# Patient Record
Sex: Male | Born: 1970 | Race: White | Hispanic: No | Smoking: Current every day smoker
Health system: Southern US, Community
[De-identification: ages and names within clinical notes are randomized; demographics above are authoritative.]

## PROBLEM LIST (undated history)

## (undated) DIAGNOSIS — G629 Polyneuropathy, unspecified: Secondary | ICD-10-CM

## (undated) DIAGNOSIS — E119 Type 2 diabetes mellitus without complications: Secondary | ICD-10-CM

## (undated) DIAGNOSIS — I10 Essential (primary) hypertension: Secondary | ICD-10-CM

## (undated) DIAGNOSIS — F1921 Other psychoactive substance dependence, in remission: Secondary | ICD-10-CM

## (undated) DIAGNOSIS — M549 Dorsalgia, unspecified: Secondary | ICD-10-CM

## (undated) DIAGNOSIS — M25569 Pain in unspecified knee: Secondary | ICD-10-CM

## (undated) DIAGNOSIS — E7800 Pure hypercholesterolemia, unspecified: Secondary | ICD-10-CM

## (undated) HISTORY — DX: Polyneuropathy, unspecified: G62.9

## (undated) HISTORY — DX: Dorsalgia, unspecified: M54.9

## (undated) HISTORY — DX: Type 2 diabetes mellitus without complications: E11.9

## (undated) HISTORY — DX: Essential (primary) hypertension: I10

## (undated) HISTORY — PX: HERNIA REPAIR: SHX51

## (undated) HISTORY — DX: Pain in unspecified knee: M25.569

## (undated) HISTORY — PX: KNEE SURGERY: SHX244

---

## 2004-08-15 ENCOUNTER — Emergency Department: Payer: Self-pay | Admitting: Emergency Medicine

## 2004-12-14 ENCOUNTER — Emergency Department: Payer: Self-pay | Admitting: General Practice

## 2005-01-19 ENCOUNTER — Emergency Department: Payer: Self-pay | Admitting: Emergency Medicine

## 2008-08-29 ENCOUNTER — Emergency Department: Payer: Self-pay | Admitting: Emergency Medicine

## 2009-05-25 ENCOUNTER — Ambulatory Visit: Payer: Self-pay | Admitting: Family Medicine

## 2009-09-28 ENCOUNTER — Ambulatory Visit: Payer: Self-pay | Admitting: Internal Medicine

## 2009-12-25 ENCOUNTER — Ambulatory Visit: Payer: Self-pay | Admitting: Family Medicine

## 2010-01-29 ENCOUNTER — Ambulatory Visit: Payer: Self-pay | Admitting: Family Medicine

## 2010-04-15 ENCOUNTER — Ambulatory Visit: Payer: Self-pay | Admitting: Family Medicine

## 2010-10-06 ENCOUNTER — Ambulatory Visit: Payer: Self-pay | Admitting: Family Medicine

## 2011-02-11 ENCOUNTER — Emergency Department: Payer: Self-pay | Admitting: Emergency Medicine

## 2011-12-25 DIAGNOSIS — M549 Dorsalgia, unspecified: Secondary | ICD-10-CM | POA: Insufficient documentation

## 2012-10-16 ENCOUNTER — Ambulatory Visit: Payer: Self-pay | Admitting: Family Medicine

## 2013-05-06 DIAGNOSIS — G4733 Obstructive sleep apnea (adult) (pediatric): Secondary | ICD-10-CM | POA: Insufficient documentation

## 2014-02-24 DIAGNOSIS — Z79891 Long term (current) use of opiate analgesic: Secondary | ICD-10-CM | POA: Insufficient documentation

## 2014-02-24 DIAGNOSIS — F1120 Opioid dependence, uncomplicated: Secondary | ICD-10-CM | POA: Insufficient documentation

## 2014-10-22 ENCOUNTER — Ambulatory Visit: Payer: Self-pay | Admitting: Family Medicine

## 2015-11-17 ENCOUNTER — Ambulatory Visit: Payer: Self-pay | Admitting: Urology

## 2015-11-17 VITALS — BP 146/78 | HR 104 | Wt 286.3 lb

## 2015-11-17 DIAGNOSIS — M792 Neuralgia and neuritis, unspecified: Secondary | ICD-10-CM

## 2015-11-17 DIAGNOSIS — E119 Type 2 diabetes mellitus without complications: Secondary | ICD-10-CM

## 2015-11-17 LAB — GLUCOSE, POCT (MANUAL RESULT ENTRY): POC Glucose: 117 mg/dl — AB (ref 70–99)

## 2015-11-17 MED ORDER — GABAPENTIN 300 MG PO CAPS: ORAL_CAPSULE | ORAL | Status: DC

## 2015-11-17 MED ORDER — METFORMIN HCL 500 MG PO TABS: 500.0000 mg | ORAL_TABLET | Freq: Two times a day (BID) | ORAL | Status: DC

## 2015-11-17 MED ORDER — LISINOPRIL-HYDROCHLOROTHIAZIDE 10-12.5 MG PO TABS: 1.0000 | ORAL_TABLET | Freq: Every day | ORAL | Status: DC

## 2015-11-17 NOTE — Progress Notes (Signed)
Patient: Luis [name] Male    DOB: 1971-02-15   45 y.o.   MRN: [id number] Visit Date: 11/17/2015  Today's Provider: [facility]   Chief Complaint  Patient presents with  . Establish Care   Subjective:    Diabetes He presents for his initial diabetic visit. He has type 2 diabetes mellitus. No MedicAlert identification noted. The initial diagnosis of diabetes was made 1 month ago. His disease course has been worsening. There are no hypoglycemic associated symptoms. Associated symptoms include blurred vision and visual change. Pertinent negatives for diabetes include no chest pain, no polydipsia, no polyphagia and no polyuria. There are no hypoglycemic complications. Symptoms are improving. Diabetic complications include peripheral neuropathy and retinopathy. Risk factors for coronary artery disease include diabetes mellitus, male sex, hypertension, obesity and tobacco exposure. Current diabetic treatment includes oral agent (monotherapy) and diet. He is compliant with treatment most of the time. His weight is decreasing steadily. He is following a generally healthy diet. Meal planning includes avoidance of concentrated sweets, carbohydrate counting and calorie counting. He has not had a previous visit with a dietitian. He participates in exercise daily. His breakfast blood glucose is taken between 7-8 am. His breakfast blood glucose range is generally 130-140 mg/dl. His dinner blood glucose is taken between 5-6 pm. His dinner blood glucose range is generally 110-130 mg/dl. An ACE inhibitor/angiotensin II receptor blocker is being taken. He does not see a podiatrist.Eye exam is not current.       No Known Allergies Previous Medications   GABAPENTIN (NEURONTIN) 300 MG CAPSULE    Take 300 mg by mouth 3 (three) times daily.   LISINOPRIL-HYDROCHLOROTHIAZIDE (ZESTORETIC) 10-12.5 MG TABLET    Take by mouth daily.   METFORMIN (GLUCOPHAGE) 500 MG TABLET    Take 500 mg by mouth 2  (two) times daily.   METHADONE (DOLOPHINE) 10 MG/5ML SOLUTION    Take by mouth.    Review of Systems  Constitutional: Positive for activity change. Negative for fever and chills.  HENT: Positive for dental problem. Negative for trouble swallowing and voice change.   Eyes: Positive for blurred vision. Negative for photophobia, pain, discharge, redness and itching.  Respiratory: Negative for cough and shortness of breath.   Cardiovascular: Positive for leg swelling. Negative for chest pain.  Gastrointestinal: Negative for nausea and diarrhea.  Endocrine: Negative for polydipsia, polyphagia and polyuria.  Genitourinary: Negative.   Musculoskeletal: Negative.   Skin: Negative.   Allergic/Immunologic: Negative.   Neurological: Negative.   Hematological: Negative.   Psychiatric/Behavioral: Negative.     Social History  Substance Use Topics  . Smoking status: Current Every Day Smoker -- 0.50 packs/day    Types: Cigarettes  . Smokeless tobacco: Never Used  . Alcohol Use: No   Objective:   BP 146/78 mmHg  Pulse 104  Wt 286 lb 4.8 oz (129.865 kg)  Physical Exam  Constitutional: He appears well-developed and well-nourished.  HENT:  Head: Normocephalic and atraumatic.  Eyes: Conjunctivae and EOM are normal. Pupils are equal, round, and reactive to light.  Cardiovascular: Normal rate, regular rhythm and normal heart sounds.   Pulmonary/Chest: Effort normal and breath sounds normal.  Abdominal: Soft. Bowel sounds are normal.  Skin: Skin is warm and dry.  Psychiatric: He has a normal mood and affect. His behavior is normal. Judgment and thought content normal.        Assessment & Plan:    DM type 2  -  Glucose is  117 ng/dL at today's visit  - refills on metformin, gabapentin, lisinopril/hctz given  - eye exam  Peripheral neuropathy  - increase the gabapentin from 300 mg tid to 2, 300 mg tablets tid     Follow up in one month  [facility]  [facility]

## 2015-12-15 ENCOUNTER — Ambulatory Visit: Payer: Self-pay

## 2016-04-28 ENCOUNTER — Ambulatory Visit: Payer: Self-pay

## 2016-04-28 ENCOUNTER — Telehealth: Payer: Self-pay | Admitting: Nurse Practitioner

## 2016-04-28 ENCOUNTER — Telehealth: Payer: Self-pay

## 2016-04-28 NOTE — Telephone Encounter (Signed)
Patient called to cancel appointment for 04/28/2016 and wanted to schedule appointment for 05/03/2016.

## 2016-04-28 NOTE — Telephone Encounter (Signed)
Called pt to make appt. appt made.

## 2016-10-27 ENCOUNTER — Other Ambulatory Visit: Payer: Self-pay | Admitting: Nurse Practitioner

## 2016-10-27 DIAGNOSIS — E119 Type 2 diabetes mellitus without complications: Secondary | ICD-10-CM

## 2016-12-01 ENCOUNTER — Ambulatory Visit: Payer: Self-pay | Admitting: Adult Health Nurse Practitioner

## 2016-12-01 VITALS — BP 121/72 | HR 97 | Temp 98.1°F | Wt 371.0 lb

## 2016-12-01 DIAGNOSIS — I10 Essential (primary) hypertension: Secondary | ICD-10-CM

## 2016-12-01 DIAGNOSIS — E1140 Type 2 diabetes mellitus with diabetic neuropathy, unspecified: Secondary | ICD-10-CM

## 2016-12-01 DIAGNOSIS — E118 Type 2 diabetes mellitus with unspecified complications: Secondary | ICD-10-CM | POA: Insufficient documentation

## 2016-12-01 DIAGNOSIS — E119 Type 2 diabetes mellitus without complications: Secondary | ICD-10-CM

## 2016-12-01 DIAGNOSIS — Z794 Long term (current) use of insulin: Secondary | ICD-10-CM | POA: Insufficient documentation

## 2016-12-01 LAB — POCT GLUCOSE (DEVICE FOR HOME USE): POC Glucose: 383 mg/dl — AB (ref 70–99)

## 2016-12-01 MED ORDER — GABAPENTIN 300 MG PO CAPS: ORAL_CAPSULE | ORAL | 3 refills | Status: DC

## 2016-12-01 MED ORDER — LISINOPRIL-HYDROCHLOROTHIAZIDE 10-12.5 MG PO TABS: 1.0000 | ORAL_TABLET | Freq: Every day | ORAL | 1 refills | Status: DC

## 2016-12-01 NOTE — Progress Notes (Signed)
  Patient: Luis [name] Male    DOB: 04-10-1971   46 y.o.   MRN: [id number] Visit Date: 12/01/2016  Today's Provider: Jacelyn Pi, NP   Chief Complaint  Patient presents with  . Medication Refill  . Back Pain  . Peripheral Neuropathy   Subjective:    HPI   DM:  Taking medications as directed.  CBGs average 150-160.  Does not have A1C in the computer.   Pt states that he has been without his gabapentin and his neuropathy is worsened in his hands and feet. He was taking 600mg  TID, states that the neuropathy was better.    HTN:  Taking medications as directed. States he is on a diet now, and he is walking daily.    No Known Allergies Previous Medications   GABAPENTIN (NEURONTIN) 300 MG CAPSULE    Take two tablets three times daily   LISINOPRIL-HYDROCHLOROTHIAZIDE (ZESTORETIC) 10-12.5 MG TABLET    Take 1 tablet by mouth daily.   METFORMIN (GLUCOPHAGE) 500 MG TABLET    Take 1 tablet (500 mg total) by mouth 2 (two) times daily.   METHADONE (DOLOPHINE) 10 MG/5ML SOLUTION    Take by mouth.    Review of Systems  All other systems reviewed and are negative.   Social History  Substance Use Topics  . Smoking status: Current Every Day Smoker    Packs/day: 0.50    Types: Cigarettes  . Smokeless tobacco: Never Used  . Alcohol use No   Objective:   BP 121/72   Pulse 97   Temp 98.1 F (36.7 C)   Wt (!) 371 lb (168.3 kg)   Physical Exam  Constitutional: He is oriented to person, place, and time. He appears well-developed and well-nourished.  HENT:  Head: Normocephalic and atraumatic.  Eyes: Pupils are equal, round, and reactive to light.  Neck: Normal range of motion. Neck supple.  Cardiovascular: Normal rate, regular rhythm and normal heart sounds.   Pulmonary/Chest: Effort normal and breath sounds normal.  Abdominal: Soft. Bowel sounds are normal.  Musculoskeletal: Normal range of motion.  Neurological: He is alert and oriented to person, place, and time.   Skin: Skin is warm and dry.  Psychiatric: He has a normal mood and affect.  Vitals reviewed.       Assessment & Plan:     DM:  A1C check today.  Encourage diabetic diet and exercise.  Continue current medication regimen.  Given glucometer- take CBG fasting 3 x week.   Neuropathy:  Restart Gabapentin 600mg  TID.  Re-evaluate for dosage increase at next OV.    HTN:  Controlled.  Goal BP <140/80.  Continue current medication regimen.  Encourage low salt diet and exercise.    Routine labs ordered.         Jacelyn Pi, NP   [facility]

## 2016-12-02 LAB — COMPREHENSIVE METABOLIC PANEL
A/G RATIO: 1.4 (ref 1.2–2.2)
ALT: 27 IU/L (ref 0–44)
AST: 21 IU/L (ref 0–40)
Albumin: 4 g/dL (ref 3.5–5.5)
Alkaline Phosphatase: 91 IU/L (ref 39–117)
BUN/Creatinine Ratio: 22 — ABNORMAL HIGH (ref 9–20)
BUN: 20 mg/dL (ref 6–24)
CALCIUM: 9.4 mg/dL (ref 8.7–10.2)
CHLORIDE: 91 mmol/L — AB (ref 96–106)
CO2: 23 mmol/L (ref 18–29)
Creatinine, Ser: 0.91 mg/dL (ref 0.76–1.27)
GFR calc Af Amer: 118 mL/min/{1.73_m2} (ref >59)
GFR, EST NON AFRICAN AMERICAN: 102 mL/min/{1.73_m2} (ref >59)
GLUCOSE: 373 mg/dL — AB (ref 65–99)
Globulin, Total: 2.9 g/dL (ref 1.5–4.5)
POTASSIUM: 4.9 mmol/L (ref 3.5–5.2)
Sodium: 132 mmol/L — ABNORMAL LOW (ref 134–144)
Total Protein: 6.9 g/dL (ref 6.0–8.5)

## 2016-12-02 LAB — CBC
Hematocrit: 45.8 % (ref 37.5–51.0)
Hemoglobin: 15.2 g/dL (ref 13.0–17.7)
MCH: 27.7 pg (ref 26.6–33.0)
MCHC: 33.2 g/dL (ref 31.5–35.7)
MCV: 83 fL (ref 79–97)
PLATELETS: 293 10*3/uL (ref 150–379)
RBC: 5.49 x10E6/uL (ref 4.14–5.80)
RDW: 14.4 % (ref 12.3–15.4)
WBC: 14.9 10*3/uL — AB (ref 3.4–10.8)

## 2016-12-02 LAB — TSH: TSH: 3.78 u[IU]/mL (ref 0.450–4.500)

## 2016-12-02 LAB — SPECIMEN STATUS REPORT

## 2016-12-06 ENCOUNTER — Ambulatory Visit: Payer: Self-pay | Admitting: Endocrinology

## 2016-12-06 VITALS — BP 138/77 | HR 98 | Temp 98.0°F | Wt 372.9 lb

## 2016-12-06 DIAGNOSIS — E119 Type 2 diabetes mellitus without complications: Secondary | ICD-10-CM

## 2016-12-06 LAB — GLUCOSE, POCT (MANUAL RESULT ENTRY): POC Glucose: 318 mg/dl — AB (ref 70–99)

## 2016-12-06 MED ORDER — EMPAGLIFLOZIN 10 MG PO TABS: 10.0000 mg | ORAL_TABLET | Freq: Every day | ORAL | 4 refills | Status: DC

## 2016-12-06 MED ORDER — DULAGLUTIDE 0.75 MG/0.5ML ~~LOC~~ SOAJ: 0.7500 mg | Freq: Every day | SUBCUTANEOUS | 11 refills | Status: DC

## 2016-12-06 NOTE — Progress Notes (Signed)
Assessment:   1. Diabetes mellitus without complication (HCC)  Needs additional medication based on April HbA1c test (12.2%) and SMBG over the past week (range: 270-470).  His weight has increased from 280 lbs last April to 375 lbs this April. Luis [name] is open to injection. Neuropathy is worsening.        Plan:    1.  Rx changes: Add Jardiance 10 mg and order trulicity now. Continue metformin. Return in one month and increase Jardiance to 25 mg when he returns. Consider adding trulicity at that time (order)  2.  Luis [name] attended the shared medical appointment on 12/06/16. Topics that were discussed tonight include diet, exercise, taking medications, blood glucose pattern recognition, meaning of laboratory results.   3. Follow up: I recommend patient follow-up with me at 1 months.    Subjective:    Luis [name] is a 46 y.o. male who is seen in follow up forType 2 diabetes. He was here one week ago but came back because his blood sugar has been above 300 since he received a meter at that time.   Eye exam current (within one year): No  Luis [name] is performing SMBG on average 3-4 times per day over the past week since he got the meter. He reports 250-350 in the morning, and high throughout the day. Range over last few days is 278-470. He takes 500 mg metformin in the morning and evening and has no side effects.   Reports no symptoms of hypoglycemia.Denies chest pain, shortness of breath, edema, foot lesions or ulcers. Denies severe hypoglycemia or admission to hospital for DKA. He reports neuropathy in both hands.   Current exercise: walks 30 min every day.   The patient's history was reviewed and updated as appropriate.  No Known Allergies   Current Outpatient Prescriptions:  .  gabapentin (NEURONTIN) 300 MG capsule, Take two tablets three times daily, Disp: 180 capsule, Rfl: 3 .  ibuprofen (ADVIL,MOTRIN) 200 MG tablet, Take 200 mg by mouth every 6 (six)  hours as needed., Disp: , Rfl:  .  lisinopril-hydrochlorothiazide (ZESTORETIC) 10-12.5 MG tablet, Take 1 tablet by mouth daily., Disp: 90 tablet, Rfl: 1 .  methadone (DOLOPHINE) 10 MG/5ML solution, Take by mouth., Disp: , Rfl:  .  metFORMIN (GLUCOPHAGE) 500 MG tablet, Take 1 tablet (500 mg total) by mouth 2 (two) times daily., Disp: 60 tablet, Rfl: 12  Social History   Social History  . Marital status: Single    Spouse name: N/A  . Number of children: N/A  . Years of education: N/A   Social History Main Topics  . Smoking status: Current Every Day Smoker    Packs/day: 0.50    Types: Cigarettes  . Smokeless tobacco: Never Used  . Alcohol use No  . Drug use: No  . Sexual activity: Not on file   Other Topics Concern  . Not on file   Social History Narrative  . No narrative on file    Family History  Problem Relation Age of Onset  . Cancer Mother   . Heart disease Father   . Diabetes Paternal Grandfather     Review of Systems A 12 point review of systems was negative except for pertinent items noted in the HPI.   Objective:     Wt Readings from Last 3 Encounters:  12/06/16 (!) 372 lb 14.4 oz (169.1 kg)  12/01/16 (!) 371 lb (168.3 kg)  11/17/15 286 lb 4.8 oz (129.9 kg)   BP  138/77   Pulse 98   Temp 98 F (36.7 C)   Wt (!) 372 lb 14.4 oz (169.1 kg)   General appearance:  alert and appears stated age, in no distress      Eyes:  conjunctivae/corneas clear. EOM's intact. Sclera anicteric. Negative lid lag or proptosis     Neck: no adenopathy, supple, symmetrical, trachea midline  Thyroid:  Mobile, normal size, no palpable nodule  Lung: clear to auscultation bilaterally  Heart:  regular rate and rhythm, S1, S2 normal, no murmur, click, rub or gallop  Abdomen:  bowel sounds normal; negative bruits  Extremities: extremities normal, atraumatic, no cyanosis or edema     Pulses: DP & PT 2+ and symmetric.  Neuro: normal without focal findings, mental status, speech  normal, alert and oriented x3, reflexes normal and symmetric and gait normal.   Feet: negative lesions or ulcers, 10 gram monofilament intact bilateral plantar surface   Skin with tags and acanthosis  Lab Review No components found for: A1C Glucose (mg/dL)  Date Value  16/05/9603 373 (H)   CO2 (mmol/L)  Date Value  12/01/2016 23   BUN (mg/dL)  Date Value  54/04/8118 20   Creatinine, Ser (mg/dL)  Date Value  14/78/2956 0.91   No components found for: LDL,  LDLCALC,  LDLDIRECT Lab Results  Component Value Date   NA 132 (L) 12/01/2016   K 4.9 12/01/2016   CL 91 (L) 12/01/2016   CO2 23 12/01/2016   BUN 20 12/01/2016   CREATININE 0.91 12/01/2016   GFRAA 118 12/01/2016   GFRNONAA 102 12/01/2016   CALCIUM 9.4 12/01/2016   ALBUMIN 4.0 12/01/2016     DIABETES MELLITUS RESULTS: Lab Results  Component Value Date   HGBA1C 12.1 (H) 12/01/2016   Lab Results  Component Value Date   LDLCALC WILL FOLLOW 12/01/2016   CREATININE 0.91 12/01/2016   Lab Results  Component Value Date   CHOL WILL FOLLOW 12/01/2016   Lab Results  Component Value Date   LDLCALC WILL FOLLOW 12/01/2016   No components found for: CHOLLLDLDIRECT No components found for: MICROALB/CR Lab Results  Component Value Date   GFRAA 118 12/01/2016   GFRNONAA 102 12/01/2016

## 2016-12-06 NOTE — Patient Instructions (Signed)
Start Jardiance (empagliflozin) when it comes in We will eventually increase dose to 25 mg We will add Trulicity or Ozempic after that if needed  Here is information on Jardiance: Empagliflozin oral tablets What is this medicine? EMPAGLIFLOZIN (EM pa gli FLOE zin) helps to treat type 2 diabetes. It helps to control blood sugar. This drug may also reduce the risk of heart attack or stroke if you have type 2 diabetes and risk factors for heart disease. Treatment is combined with diet and exercise. This medicine may be used for other purposes; ask your health care provider or pharmacist if you have questions. COMMON BRAND NAME(S): JARDIANCE What should I tell my health care provider before I take this medicine? They need to know if you have any of these conditions: -dehydration -diabetic ketoacidosis -diet low in salt -eating less due to illness, surgery, dieting, or any other reason -having surgery -high cholesterol -high levels of potassium in the blood -history of pancreatitis or pancreas problems -history of yeast infection of the penis or vagina -if you often drink alcohol -infections in the bladder, kidneys, or urinary tract -kidney disease -liver disease -low blood pressure -on hemodialysis -problems urinating -type 1 diabetes -uncircumcised male -an unusual or allergic reaction to empagliflozin, other medicines, foods, dyes, or preservatives -pregnant or trying to get pregnant -breast-feeding How should I use this medicine? Take this medicine by mouth with a glass of water. Follow the directions on the prescription label. Take it in the morning, with or without food. Take your dose at the same time each day. Do not take more often than directed. Do not stop taking except on your doctor's advice. Talk to your pediatrician regarding the use of this medicine in children. Special care may be needed. Overdosage: If you think you have taken too much of this medicine contact a  poison control center or emergency room at once. NOTE: This medicine is only for you. Do not share this medicine with others. What if I miss a dose? If you miss a dose, take it as soon as you can. If it is almost time for your next dose, take only that dose. Do not take double or extra doses. What may interact with this medicine? Do not take this medicine with any of the following medications: -gatifloxacin This medicine may also interact with the following medications: -alcohol -certain medicines for blood pressure, heart disease -diuretics This list may not describe all possible interactions. Give your health care provider a list of all the medicines, herbs, non-prescription drugs, or dietary supplements you use. Also tell them if you smoke, drink alcohol, or use illegal drugs. Some items may interact with your medicine. What should I watch for while using this medicine? Visit your doctor or health care professional for regular checks on your progress. This medicine can cause a serious condition in which there is too much acid in the blood. If you develop nausea, vomiting, stomach pain, unusual tiredness, or breathing problems, stop taking this medicine and call your doctor right away. If possible, use a ketone dipstick to check for ketones in your urine. A test called the HbA1C (A1C) will be monitored. This is a simple blood test. It measures your blood sugar control over the last 2 to 3 months. You will receive this test every 3 to 6 months. Learn how to check your blood sugar. Learn the symptoms of low and high blood sugar and how to manage them. Always carry a quick-source of sugar with you in  case you have symptoms of low blood sugar. Examples include hard sugar candy or glucose tablets. Make sure others know that you can choke if you eat or drink when you develop serious symptoms of low blood sugar, such as seizures or unconsciousness. They must get medical help at once. Tell your doctor or  health care professional if you have high blood sugar. You might need to change the dose of your medicine. If you are sick or exercising more than usual, you might need to change the dose of your medicine. Do not skip meals. Ask your doctor or health care professional if you should avoid alcohol. Many nonprescription cough and cold products contain sugar or alcohol. These can affect blood sugar. Wear a medical ID bracelet or chain, and carry a card that describes your disease and details of your medicine and dosage times. What side effects may I notice from receiving this medicine? Side effects that you should report to your doctor or health care professional as soon as possible: -allergic reactions like skin rash, itching or hives, swelling of the face, lips, or tongue -breathing problems -dizziness -fast or irregular heartbeat -feeling faint or lightheaded, falls -muscle weakness -nausea, vomiting, unusual stomach upset or pain -signs and symptoms of low blood sugar such as feeling anxious, confusion, dizziness, increased hunger, unusually weak or tired, sweating, shakiness, cold, irritable, headache, blurred vision, fast heartbeat, loss of consciousness -signs and symptoms of a urinary tract infection, such as fever, chills, a burning feeling when urinating, blood in the urine, back pain -trouble passing urine or change in the amount of urine, including an urgent need to urinate more often, in larger amounts, or at night -penile discharge, itching, or pain in men -unusual tiredness -vaginal discharge, itching, or odor in women Side effects that usually do not require medical attention (report to your doctor or health care professional if they continue or are bothersome): -joint pain -mild increase in urination -thirsty This list may not describe all possible side effects. Call your doctor for medical advice about side effects. You may report side effects to FDA at 1-800-FDA-1088. Where  should I keep my medicine? Keep out of the reach of children. Store at room temperature between 20 and 25 degrees C (68 and 77 degrees F). Throw away any unused medicine after the expiration date. NOTE: This sheet is a summary. It may not cover all possible information. If you have questions about this medicine, talk to your doctor, pharmacist, or health care provider.  2018 Elsevier/Gold Standard (2015-09-10 11:46:10)

## 2016-12-07 LAB — SPECIMEN STATUS REPORT

## 2016-12-07 LAB — LIPID PANEL W/O CHOL/HDL RATIO

## 2016-12-07 LAB — HGB A1C W/O EAG: Hgb A1c MFr Bld: 12.1 % — ABNORMAL HIGH (ref 4.8–5.6)

## 2016-12-08 ENCOUNTER — Telehealth: Payer: Self-pay

## 2016-12-08 DIAGNOSIS — E119 Type 2 diabetes mellitus without complications: Secondary | ICD-10-CM

## 2016-12-08 MED ORDER — DULAGLUTIDE 0.75 MG/0.5ML ~~LOC~~ SOAJ: 0.7500 mg | SUBCUTANEOUS | 11 refills | Status: DC

## 2016-12-08 NOTE — Telephone Encounter (Signed)
-----   Message from Skipper Cliche, [facility] sent at 12/08/2016 10:41 AM EDT ----- Regarding: dose clarification for patient Hi Lorrie and Angelica Chessman, Just wanted to see if you could update the medication record for [employer]? The Trulicity was ordered on 12/06/16 by Dr. Almond Lint for 0.75mg , inject once daily. This should be injected once weekly.   The patient received a sample yesterday and we had already corrected the directions. Patient is aware to use the medication once weekly.  Thank you!  Keri

## 2016-12-22 ENCOUNTER — Ambulatory Visit: Payer: Self-pay | Admitting: Pharmacy Technician

## 2016-12-22 DIAGNOSIS — Z79899 Other long term (current) drug therapy: Secondary | ICD-10-CM

## 2016-12-22 NOTE — Patient Instructions (Signed)
Patient to schedule MTM with receptionist.

## 2016-12-22 NOTE — Progress Notes (Signed)
Completed [facility] application and contract.  Patient agreed to all terms of the [facility] contract.   Patient approved to receive medication assistance through 2018 at [facility], as long as eligibility criteria continues to be met.  Provided patient with Civil engineer, contracting based on his particular needs.    Jardiance and [employer] completed with patient.  Forwarded to [facility] and patient for signature.  Upon receipt of signed applications from provider and patient, Jardiance Prescription Application will be submitted to [employer] and Trulicity will be submitted to [employer].  [facility]

## 2017-01-03 ENCOUNTER — Ambulatory Visit: Payer: Self-pay | Admitting: Internal Medicine

## 2017-01-03 DIAGNOSIS — E119 Type 2 diabetes mellitus without complications: Secondary | ICD-10-CM

## 2017-01-03 MED ORDER — METFORMIN HCL 500 MG PO TABS: 1000.0000 mg | ORAL_TABLET | Freq: Two times a day (BID) | ORAL | 3 refills | Status: DC

## 2017-01-03 NOTE — Progress Notes (Signed)
Assessment:   Luis [name] feels better than last time we saw him 1 month ago and added trulicity. He reports his blood sugar readings have come down from 270-440 to 140-220 in the AM and PM. He has also lost 4 pounds. It is too soon to redraw HbA1c.     Plan:    1.  Rx changes: Increase metformin to 1000 mg BID. Add 10 mg Jardiance.    2.  Luis [name] attended the shared medical appointment on @DATE @. Topics that were discussed tonight include diet, exercise, taking medications, blood glucose pattern recognition, meaning of laboratory results.   3. Follow up: I recommend patient follow-up with me at  2 months.    Subjective:    Luis [name] is a 46 y.o. male who is seen in follow up forType 2 diabetes.  Current regimen:Luis [name] started taking the Trulicity 1 month ago and reports much lower blood sugars.   500 mg metformin 2x a day Trulicity (0.75 mg/1x a week)   He tests in the morning and in the evening. He sees 150s in the morning and the evening. Range 140-220. He did not bring his meter.   No problems with hypoglycemia. Denies chest pain, shortness of breath, edema, foot lesions or ulcers. Denies severe hypoglycemia or admission to hospital for DKA.  Current exercise: He walks every day and stays active, complicated by neuropathy.   The patient's history was reviewed and updated as appropriate.  No Known Allergies   Current Outpatient Prescriptions:  .  Dulaglutide (TRULICITY) 0.75 MG/0.5ML SOPN, Inject 0.75 mg into the skin once a week., Disp: 4 pen, Rfl: 11 .  gabapentin (NEURONTIN) 300 MG capsule, Take two tablets three times daily, Disp: 180 capsule, Rfl: 3 .  ibuprofen (ADVIL,MOTRIN) 200 MG tablet, Take 200 mg by mouth every 6 (six) hours as needed., Disp: , Rfl:  .  lisinopril-hydrochlorothiazide (ZESTORETIC) 10-12.5 MG tablet, Take 1 tablet by mouth daily., Disp: 90 tablet, Rfl: 1 .  methadone (DOLOPHINE) 10 MG/5ML solution, Take by mouth., Disp: , Rfl:  .   empagliflozin (JARDIANCE) 10 MG TABS tablet, Take 10 mg by mouth daily., Disp: 90 tablet, Rfl: 4 .  metFORMIN (GLUCOPHAGE) 500 MG tablet, Take 1 tablet (500 mg total) by mouth 2 (two) times daily., Disp: 60 tablet, Rfl: 12  Social History   Social History  . Marital status: Single    Spouse name: N/A  . Number of children: N/A  . Years of education: N/A   Social History Main Topics  . Smoking status: Current Every Day Smoker    Packs/day: 0.50    Types: Cigarettes  . Smokeless tobacco: Never Used  . Alcohol use No  . Drug use: No  . Sexual activity: Not on file   Other Topics Concern  . Not on file   Social History Narrative  . No narrative on file    Family History  Problem Relation Age of Onset  . Cancer Mother   . Heart disease Father   . Diabetes Paternal Grandfather     Review of Systems A 12 point review of systems was negative except for pertinent items noted in the HPI.   Objective:     Wt Readings from Last 3 Encounters:  12/06/16 (!) 372 lb 14.4 oz (169.1 kg)  12/01/16 (!) 371 lb (168.3 kg)  11/17/15 286 lb 4.8 oz (129.9 kg)   There were no vitals taken for this visit.  General appearance:  alert and appears stated  age, in no distress      Eyes:  conjunctivae/corneas clear. EOM's intact. Sclera anicteric. Negative lid lag or proptosis     Neck: no adenopathy, supple, symmetrical, trachea midline  Thyroid:  Mobile, normal size, no palpable nodule  Lung: clear to auscultation bilaterally  Heart:  regular rate and rhythm, S1, S2 normal, no murmur, click, rub or gallop  Abdomen:  bowel sounds normal; negative bruits  Extremities: extremities normal, atraumatic, no cyanosis or edema     Pulses: DP & PT 2+ and symmetric.  Neuro: normal without focal findings, mental status, speech normal, alert and oriented x3, reflexes normal and symmetric and gait normal.   Feet: negative lesions or ulcers, 10 gram monofilament intact bilateral plantar surface      Lab Review No components found for: A1C Glucose (mg/dL)  Date Value  24/40/1027 373 (H)   CO2 (mmol/L)  Date Value  12/01/2016 23   BUN (mg/dL)  Date Value  25/36/6440 20   Creatinine, Ser (mg/dL)  Date Value  34/74/2595 0.91   No components found for: LDL,  LDLCALC,  LDLDIRECT Lab Results  Component Value Date   NA 132 (L) 12/01/2016   K 4.9 12/01/2016   CL 91 (L) 12/01/2016   CO2 23 12/01/2016   BUN 20 12/01/2016   CREATININE 0.91 12/01/2016   GFRAA 118 12/01/2016   GFRNONAA 102 12/01/2016   CALCIUM 9.4 12/01/2016   ALBUMIN 4.0 12/01/2016     DIABETES MELLITUS RESULTS: Lab Results  Component Value Date   HGBA1C 12.1 (H) 12/01/2016   Lab Results  Component Value Date   CREATININE 0.91 12/01/2016   Lab Results  Component Value Date   CHOL CANCELED 12/01/2016   No results found for: LDLCALC No components found for: CHOLLLDLDIRECT No components found for: MICROALB/CR Lab Results  Component Value Date   GFRAA 118 12/01/2016   GFRNONAA 102 12/01/2016

## 2017-01-03 NOTE — Patient Instructions (Addendum)
If you are able to pick up the Jardiance, begin taking the 10 mg dose. It will make you pee more during the night. If this is not tolerable, call and we can increase the dose of Trulicity instead. Stay up on fluids while you take the Jardiance.  Increase your metformin to 2 pills twice a day. If you cannot tolerate go back to your normal dose.

## 2017-01-05 ENCOUNTER — Telehealth: Payer: Self-pay

## 2017-01-05 ENCOUNTER — Encounter: Payer: Self-pay | Admitting: Pharmacist

## 2017-01-05 NOTE — Telephone Encounter (Signed)
Received PAP application from [facility] for Jardiance placed for provider to sign. 

## 2017-01-09 ENCOUNTER — Ambulatory Visit
Admission: RE | Admit: 2017-01-09 | Discharge: 2017-01-09 | Disposition: A | Discharge status: Home / Self Care | Payer: Disability Insurance | Source: Ambulatory Visit | Attending: Obstetrics and Gynecology | Admitting: Obstetrics and Gynecology

## 2017-01-09 ENCOUNTER — Other Ambulatory Visit: Payer: Self-pay | Admitting: Obstetrics and Gynecology

## 2017-01-09 DIAGNOSIS — G8929 Other chronic pain: Secondary | ICD-10-CM

## 2017-01-09 DIAGNOSIS — E119 Type 2 diabetes mellitus without complications: Secondary | ICD-10-CM | POA: Insufficient documentation

## 2017-01-09 DIAGNOSIS — M1711 Unilateral primary osteoarthritis, right knee: Secondary | ICD-10-CM | POA: Insufficient documentation

## 2017-01-09 DIAGNOSIS — G629 Polyneuropathy, unspecified: Secondary | ICD-10-CM

## 2017-01-09 DIAGNOSIS — I10 Essential (primary) hypertension: Secondary | ICD-10-CM | POA: Diagnosis present

## 2017-01-09 DIAGNOSIS — M25561 Pain in right knee: Secondary | ICD-10-CM

## 2017-01-09 DIAGNOSIS — M549 Dorsalgia, unspecified: Secondary | ICD-10-CM

## 2017-01-09 DIAGNOSIS — R262 Difficulty in walking, not elsewhere classified: Secondary | ICD-10-CM

## 2017-01-19 ENCOUNTER — Ambulatory Visit: Payer: Disability Insurance | Admitting: Pharmacist

## 2017-01-19 ENCOUNTER — Telehealth: Payer: Self-pay

## 2017-01-19 ENCOUNTER — Encounter: Payer: Self-pay | Admitting: Pharmacist

## 2017-01-19 VITALS — BP 119/69 | Ht 70.0 in | Wt 367.0 lb

## 2017-01-19 DIAGNOSIS — Z79899 Other long term (current) drug therapy: Secondary | ICD-10-CM
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
## 2017-01-19 NOTE — Telephone Encounter (Signed)
Placed signed application/script in [facility] folder for pickup.Received PAP application from [facility] for Trulicity placed for provider to sign.

## 2017-01-19 NOTE — Telephone Encounter (Signed)
Placed signed application/script in [facility] folder for pickup. 

## 2017-02-07 ENCOUNTER — Telehealth: Payer: Self-pay | Admitting: Pharmacist

## 2017-02-07 NOTE — Telephone Encounter (Signed)
02/07/17 Faxed [employer] application & script for Jardiance 10 mg Take 1 tablet by mouth every day.AJ 02/07/17 Faxed [employer] application for Trulicity 0.75mg  Inject 0.75mg  pen into the skin once a week.Luis [name]

## 2017-03-02 ENCOUNTER — Ambulatory Visit: Payer: Self-pay | Admitting: Nurse Practitioner

## 2017-03-02 VITALS — BP 141/78 | HR 105 | Temp 99.5°F | Ht 70.0 in | Wt 371.0 lb

## 2017-03-02 DIAGNOSIS — E119 Type 2 diabetes mellitus without complications: Secondary | ICD-10-CM

## 2017-03-02 LAB — GLUCOSE, POCT (MANUAL RESULT ENTRY): POC Glucose: 172 mg/dl — AB (ref 70–99)

## 2017-03-02 MED ORDER — GABAPENTIN 300 MG PO CAPS: ORAL_CAPSULE | ORAL | 3 refills | Status: DC

## 2017-03-02 MED ORDER — LISINOPRIL-HYDROCHLOROTHIAZIDE 10-12.5 MG PO TABS: 1.0000 | ORAL_TABLET | Freq: Every day | ORAL | 1 refills | Status: DC

## 2017-03-02 NOTE — Progress Notes (Unsigned)
PT HERE TODAY FOR F/U  STATES HE HAD LOST 30 POUNDS, THAN REGAINED THE 30 AND MORE  HERE TODAY WITH CONCERN OF NAUSEA FOR FIRST TWO DAYS AFTER TAKING THE TRULICITY, ON 0.75 DOSE   EXAM:    ALERT VERBALLY APPROPRIATE AND IN NO ACUTE DISTRESS  NO CAROTID BRUITS, AP RRR, BBrS CLEAR, BLE WITH 2+ EDEDA      PLAN:  WILL CONTINUE TRULICITY AT CURRENT DOSE WILL PRESCRIBE A NAUSEA MED TO BE USED THE FIRST 2 DAYS, RATIONALE DISCUSSED WITH PT.    PAIN CONTINUES WILL INCREASE GABAPENTIN TO 6OO IN AM AND AND 900 MG MID DAY AND EVENING  BP IS ELEVATED TODAY, WILL REASSESS AFTER LABS  WILL RETURN AFTER LABS

## 2017-03-16 ENCOUNTER — Other Ambulatory Visit: Payer: Self-pay

## 2017-03-21 ENCOUNTER — Ambulatory Visit: Payer: Self-pay

## 2017-03-23 ENCOUNTER — Other Ambulatory Visit: Payer: Self-pay

## 2017-03-23 DIAGNOSIS — E119 Type 2 diabetes mellitus without complications: Secondary | ICD-10-CM

## 2017-03-23 DIAGNOSIS — I10 Essential (primary) hypertension: Secondary | ICD-10-CM

## 2017-03-24 LAB — SPECIMEN STATUS REPORT

## 2017-03-25 LAB — COMPREHENSIVE METABOLIC PANEL
ALK PHOS: 86 IU/L (ref 39–117)
ALT: 18 IU/L (ref 0–44)
AST: 15 IU/L (ref 0–40)
Albumin/Globulin Ratio: 1.3 (ref 1.2–2.2)
Albumin: 4.3 g/dL (ref 3.5–5.5)
BUN/Creatinine Ratio: 18 (ref 9–20)
BUN: 17 mg/dL (ref 6–24)
Bilirubin Total: <0.2 mg/dL (ref 0.0–1.2)
CALCIUM: 9 mg/dL (ref 8.7–10.2)
CO2: 19 mmol/L — AB (ref 20–29)
CREATININE: 0.93 mg/dL (ref 0.76–1.27)
Chloride: 95 mmol/L — ABNORMAL LOW (ref 96–106)
GFR calc Af Amer: 114 mL/min/{1.73_m2} (ref >59)
GFR, EST NON AFRICAN AMERICAN: 99 mL/min/{1.73_m2} (ref >59)
GLOBULIN, TOTAL: 3.3 g/dL (ref 1.5–4.5)
GLUCOSE: 169 mg/dL — AB (ref 65–99)
Potassium: 5.6 mmol/L — ABNORMAL HIGH (ref 3.5–5.2)
Sodium: 140 mmol/L (ref 134–144)
Total Protein: 7.6 g/dL (ref 6.0–8.5)

## 2017-03-25 LAB — HEMOGLOBIN A1C
Est. average glucose Bld gHb Est-mCnc: 232 mg/dL
HEMOGLOBIN A1C: 9.7 % — AB (ref 4.8–5.6)

## 2017-03-25 LAB — TSH: TSH: 6.75 u[IU]/mL — AB (ref 0.450–4.500)

## 2017-04-06 ENCOUNTER — Telehealth: Payer: Self-pay | Admitting: Urology

## 2017-04-06 ENCOUNTER — Ambulatory Visit: Payer: Self-pay | Admitting: Urology

## 2017-04-06 VITALS — BP 154/85 | HR 97 | Temp 98.2°F | Wt 370.2 lb

## 2017-04-06 DIAGNOSIS — E119 Type 2 diabetes mellitus without complications: Secondary | ICD-10-CM

## 2017-04-06 LAB — GLUCOSE, POCT (MANUAL RESULT ENTRY): POC GLUCOSE: 231 mg/dL — AB (ref 70–99)

## 2017-04-06 NOTE — Telephone Encounter (Signed)
Would you check on patient's Trulicity prescription?

## 2017-04-06 NOTE — Progress Notes (Signed)
  Patient: Luis [name] Male    DOB: 1971/08/07   46 y.o.   MRN: [id number] Visit Date: 04/06/2017  Today's Provider: [facility]   Chief Complaint  Patient presents with  . Follow-up    Needs more test strips; says pharmacy is out of Trulicity    Subjective:    HPI Patient has a crack in his left heel and is afraid that it will get infected.  He has been out of his Trulicity x one month.    BS at home have been around 170.  He has been taking his Jardiance 10 mg daily and metformin 500 mg bid  Crack in his heel has been present for months.  He has been using a pumice stone.    No Known Allergies Previous Medications   DULAGLUTIDE (TRULICITY) 0.75 MG/0.5ML SOPN    Inject 0.75 mg into the skin once a week.   EMPAGLIFLOZIN (JARDIANCE) 10 MG TABS TABLET    Take 10 mg by mouth daily.   GABAPENTIN (NEURONTIN) 300 MG CAPSULE    TAKE 2 TABLETS IN THE MORNING, 3 TABLETS MID DAY, AND 3 TABLETS IN THE EVENING   IBUPROFEN (ADVIL,MOTRIN) 200 MG TABLET    Take 200 mg by mouth every 6 (six) hours as needed.   LISINOPRIL-HYDROCHLOROTHIAZIDE (ZESTORETIC) 10-12.5 MG TABLET    Take 1 tablet by mouth daily.   METFORMIN (GLUCOPHAGE) 500 MG TABLET    Take 2 tablets (1,000 mg total) by mouth 2 (two) times daily.   METHADONE (DOLOPHINE) 10 MG/5ML SOLUTION    Take by mouth.    Review of Systems  Social History  Substance Use Topics  . Smoking status: Current Every Day Smoker    Packs/day: 1.00    Types: Cigarettes  . Smokeless tobacco: Never Used  . Alcohol use No   Objective:   BP (!) 154/85   Pulse 97   Temp 98.2 F (36.8 C)   Wt (!) 370 lb 3.2 oz (167.9 kg)   BMI 53.12 kg/m   Physical Exam Constitutional: Well nourished. Alert and oriented, No acute distress. HEENT: [place] AT, moist mucus membranes. Trachea midline, no masses. Cardiovascular: No clubbing, cyanosis, or edema. Respiratory: Normal respiratory effort, no increased work of breathing. Skin: No rashes, bruises or  suspicious lesions. Lymph: No cervical or inguinal adenopathy. Neurologic: Grossly intact, no focal deficits, moving all 4 extremities. Psychiatric: Normal mood and affect.      Assessment & Plan:    1. Heel fungus   - pumice stone  - epsom salts  - vaseline  - refer to podiatry       [facility]   [facility]

## 2017-04-06 NOTE — Patient Instructions (Signed)
Use pumice stone, epsom salts and then vaseline

## 2017-04-08 LAB — MICROALBUMIN / CREATININE URINE RATIO
Creatinine, Urine: 314.7 mg/dL
MICROALB/CREAT RATIO: 25.8 mg/g{creat} (ref 0.0–30.0)
Microalbumin, Urine: 81.3 ug/mL

## 2017-04-08 LAB — SPECIMEN STATUS REPORT

## 2017-04-11 ENCOUNTER — Ambulatory Visit: Payer: Self-pay | Admitting: Endocrinology

## 2017-04-11 VITALS — BP 142/82 | HR 103 | Temp 97.9°F | Ht 70.0 in | Wt 365.3 lb

## 2017-04-11 DIAGNOSIS — E1140 Type 2 diabetes mellitus with diabetic neuropathy, unspecified: Secondary | ICD-10-CM

## 2017-04-11 DIAGNOSIS — E119 Type 2 diabetes mellitus without complications: Secondary | ICD-10-CM

## 2017-04-11 LAB — GLUCOSE, POCT (MANUAL RESULT ENTRY): POC GLUCOSE: 310 mg/dL — AB (ref 70–99)

## 2017-04-11 LAB — POCT GLYCOSYLATED HEMOGLOBIN (HGB A1C): HEMOGLOBIN A1C: 9.8

## 2017-04-11 NOTE — Addendum Note (Signed)
Addended by: Hollace Hayward E on: 04/11/2017 09:07 PM   Modules accepted: Orders

## 2017-04-11 NOTE — Progress Notes (Signed)
I saw this patient with the medical student and agree with this plan.

## 2017-04-11 NOTE — Progress Notes (Signed)
Patient's primary care provider: Zachery Dauer, FNP   Luis [name] returns for follow-up of T2 diabetes.    Other problems include:  Patient Active Problem List   Diagnosis Date Noted  . Diabetes mellitus without complication (HCC) 12/01/2016  . Hypertension 12/01/2016  . Type 2 diabetes mellitus with diabetic neuropathy, without long-term current use of insulin (HCC) 12/01/2016    His current medications include: Current Outpatient Prescriptions  Medication Sig Dispense Refill  . empagliflozin (JARDIANCE) 10 MG TABS tablet Take 10 mg by mouth daily. 90 tablet 4  . gabapentin (NEURONTIN) 300 MG capsule TAKE 2 TABLETS IN THE MORNING, 3 TABLETS MID DAY, AND 3 TABLETS IN THE EVENING 180 capsule 3  . ibuprofen (ADVIL,MOTRIN) 200 MG tablet Take 200 mg by mouth every 6 (six) hours as needed.    Marland Kitchen lisinopril-hydrochlorothiazide (ZESTORETIC) 10-12.5 MG tablet Take 1 tablet by mouth daily. 90 tablet 1  . metFORMIN (GLUCOPHAGE) 500 MG tablet Take 2 tablets (1,000 mg total) by mouth 2 (two) times daily. 360 tablet 3  . methadone (DOLOPHINE) 10 MG/5ML solution Take by mouth.    . Dulaglutide (TRULICITY) 0.75 MG/0.5ML SOPN Inject 0.75 mg into the skin once a week. (Patient not taking: Reported on 04/06/2017) 4 pen 11   No current facility-administered medications for this visit.     Interim history: Pt reports he hasn't been taking Trulicity b/c he didn't get his refills. Been off Trulicity for ~2 months.  Pt reports checking BG in AM and PM. He says when he was on Trulicity it ran 140 and now it runs as high as 180 in AM. He reports 270 BS in afternoon (tries to check in between meals).  Pt reports he likes to eat stir-fry veggies and tries to stay away from lots of grains. He reports he drinks water, diet soda, and apple juice. Living at home with mom, who does most of the cooking. 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  He reports more frequent urination w/ addition of Jardiance 1 mo ago. Happy to continue using Jardiance, isn't bothered by increased urination. He reports little exercise due to back pain and poor conditioning. He reports neuropathy in hand and feet and back pain. Pt says leg edema and redness have been present for 2 yrs.   Pt reports no SOB or chest pain.  Pt is a current smoker - 1 pack. Pt is hesitant to quit but acknowledges he needs to.  Pt reports sinus infection for 2 weeks.    Exam:  BP (!) 142/82 (BP Location: Left Arm)   Pulse (!) 103   Temp 97.9 F (36.6 C)   Ht 5\' 10"  (1.778 m)   Wt (!) 365 lb 4.8 oz (165.7 kg)   BMI 52.42 kg/m  Constitutional: Awake, Alert, oriented, in NAD Cardiovascular: Regular rhythm, no murmurs, gallops, rubs Respiratory: Lungs clear, no wheezes or rales Skin: no rashes or lesions. Feet without lesions except extremely large dry cracked callus on left heel Neurologic: 2+ Edema present. No light touch sensation in feet in 5/5 spots bilaterally by monofilament Psychiatric: Oriented, normal mood and affect.   Recent labs:  Results for orders placed or performed in visit on 04/11/17  POCT Glucose (CBG)  Result Value Ref Range   POC Glucose 310 (A) 70 - 99 mg/dl    Assessment and Plan:    1. Diabetes mellitus without complication (  HCC)  - Pt counseled to continue taking medication as prescribed and work on increasing exercise as permitted by chronic pain and poor conditioning. Counseled to quit smoking whenever ready, offered help if interested. Currently interested but not ready to quit. Pt will reinitiate Trulicity once prescription returns.   Patient will follow up in 2 months - check in about podiatry and BS after addition of Trulicity. Will increase trulicity or jardiance if BS remains high.

## 2017-04-12 ENCOUNTER — Telehealth: Payer: Self-pay | Admitting: Pharmacist

## 2017-04-12 NOTE — Telephone Encounter (Signed)
04/12/17 Called [employer] to check on status of Trulicity application that was faxed 02/07/17, spoke with Ashem. Verified received the application, stated it was missing no income letter, I explained that was faxed with application, he placed me on hold and reviewed. He does see the letter, he will send for review and processing, allow 10-14 days.Forde Radon

## 2017-04-12 NOTE — Telephone Encounter (Signed)
04/12/2017 Trulicity- will need to call the company [employer] to check on status of application.Forde Radon

## 2017-04-12 NOTE — Telephone Encounter (Signed)
04/12/17 Called [employer] to check on status of Jardiance that was faxed 03/01/17, spoke with Jan, she stated this medication was shipped to patient on 03/07/17 and delivered on 03/11/17. Next refill due 05-06-17, she was able to process that refill today it with auto ship on 05/06/17 to patient's home, order# [id number], 2 refill left, enrolled till 7/112019.Luis [name]

## 2017-04-27 ENCOUNTER — Encounter: Payer: Self-pay | Admitting: Pharmacist

## 2017-04-27 ENCOUNTER — Ambulatory Visit: Payer: Disability Insurance | Admitting: Pharmacist

## 2017-04-27 VITALS — BP 140/82 | Ht 70.0 in | Wt 364.0 lb

## 2017-04-27 DIAGNOSIS — Z79899 Other long term (current) drug therapy: Secondary | ICD-10-CM

## 2017-04-27 NOTE — Progress Notes (Signed)
Medication Management Clinic Visit Note  Patient: Jaquin Lee Blacketer MRN: [id number] Date of Birth: [birth year] PCP: Zachery Dauer, FNP   [name] 46 y.o. male presents for a 3 mo. F/u MTM visit today.  BP 140/82   Ht  (1.778 m)   Wt (!) 364 lb (165.1 kg)   BMI 52.23 kg/m   Patient Information   Past Medical History:  Diagnosis Date  . Back pain   . Diabetes mellitus without complication (HCC)   . Hypertension   . Neuropathy       Past Surgical History:  Procedure Laterality Date  . KNEE SURGERY Right      Family History  Problem Relation Age of Onset  . Cancer Mother   . Heart disease Father   . Diabetes Paternal Grandfather     New Diagnoses (since last visit): none  Family Support: good  Lifestyle Diet:  uses splenda  Breakfast: 3 eggs, bacon, toast or  Plain cheerios Lunch: ham sandwich, wheat bread Dinner: usually meat and veggies, steak and salad Drinks: diet drinks, lots of water          History  Alcohol Use No      History  Smoking Status  . Current Every Day Smoker  . Packs/day: 1.25  . Types: Cigarettes  Smokeless Tobacco  . Never Used      Health Maintenance  Topic Date Due  . PNEUMOCOCCAL POLYSACCHARIDE VACCINE (1) 02/25/1974  . FOOT EXAM  02/25/1982  . OPHTHALMOLOGY EXAM  02/25/1982  . HIV Screening  02/26/1987  . TETANUS/TDAP  02/26/1991  . INFLUENZA VACCINE  03/22/2017  . HEMOGLOBIN A1C  10/12/2017   Outpatient Encounter Prescriptions as of 04/27/2017  Medication Sig  . empagliflozin (JARDIANCE) 10 MG TABS tablet Take 10 mg by mouth daily.  Marland Kitchen gabapentin (NEURONTIN) 300 MG capsule TAKE 2 TABLETS IN THE MORNING, 3 TABLETS MID DAY, AND 3 TABLETS IN THE EVENING  . ibuprofen (ADVIL,MOTRIN) 200 MG tablet Take 200 mg by mouth every 6 (six) hours as needed.  Marland Kitchen lisinopril-hydrochlorothiazide (ZESTORETIC) 10-12.5 MG tablet Take 1 tablet by mouth daily.  . metFORMIN (GLUCOPHAGE) 500 MG tablet Take 2 tablets (1,000 mg  total) by mouth 2 (two) times daily.  . methadone (DOLOPHINE) 10 MG/5ML solution Take by mouth.  . Dulaglutide (TRULICITY) 0.75 MG/0.5ML SOPN Inject 0.75 mg into the skin once a week. (Patient not taking: Reported on 04/06/2017)   No facility-administered encounter medications on file as of 04/27/2017.     Assessment and Plan:  Compliance/Adherance: pt knows 3W's of all his medication with minimal prompting. States that he rarely misses doses of medications. Pt mentioned that he would like to use a medication organizer will give him one from our stock.  T2DM: checking BG in morning when he gets up and then after evening meal. While on Trulicity BG were running no more than 150s usually lower. He is having trouble maintaining his sugars at that level since he has not been taking the Trulicity. We had 1 month supply in stock that was dispensed to patient and has been waiting for medication to be sent by manufactuer. Expect that the medication will be delivered before next week.  He has been taking Jardiance since it was shipped to his home by the manufacturer. He states that it made him feel tired at first but that he doing well with it now. Will give pt a BG log this visit.  HTN: BP today ofJeyden Coffelt140/82 within  goal of <140/90.  DM neuropathy:  States that the neuropathy is not improving and that he has been taking 1 extra gabapentin. Pt requesting increase - will send note to [facility] to request increase.   Smoking Cessation: would like to quit and willing to try. Discussed several options and pt would like to try to obtain free patches through the quitline. He also would like to speak with MD at next visit to [facility] about trying Chantix if the patches don't work or are not able to obtain.  Pain Management: pt still on 130mg  - but has decreased from 240mg  in the past year. Is interested in discussing possibility of changing to Suboxone with Methadone clinic MD and explained to patient that PAP is available from  the drug manufacturer and the PAP application would need to be filled out and signed/handled by methadone clinic.   Denice Paradise, PharmD, RPh Medication Management Clinic [facility]) [phone]

## 2017-05-01 ENCOUNTER — Telehealth: Payer: Self-pay | Admitting: Pharmacist

## 2017-05-01 NOTE — Telephone Encounter (Signed)
05/01/17 Called [employer] for refill on Jardiance 10mg , they already have in process, this will ship to patient.Forde Radon

## 2017-05-04 ENCOUNTER — Ambulatory Visit: Payer: Self-pay | Admitting: Podiatry

## 2017-05-11 ENCOUNTER — Ambulatory Visit: Payer: Self-pay | Admitting: Family Medicine

## 2017-05-11 VITALS — BP 132/80 | HR 92 | Temp 98.4°F | Wt 368.5 lb

## 2017-05-11 DIAGNOSIS — E1140 Type 2 diabetes mellitus with diabetic neuropathy, unspecified: Secondary | ICD-10-CM

## 2017-05-11 LAB — GLUCOSE, POCT (MANUAL RESULT ENTRY): POC GLUCOSE: 240 mg/dL — AB (ref 70–99)

## 2017-05-11 NOTE — Progress Notes (Signed)
   Subjective:    Patient ID: Luis [name], male    DOB: [birth year], 46 y.o.   MRN: [id number]  HPI  FU appointment   Pt reports having problems obtaining Trulicity from [facility]   Pt reports continuing treatment at Methadone  clinic    Review of Systems     Objective:   Physical Exam  BP 132/80   Pulse 92   Temp 98.4 F (36.9 C)   Wt (!) 368 lb 8 oz (167.2 kg)   BMI 52.87 kg/m  Morbidly obese, pleasant, white male in no acute distress. He was asleep sitting in the chair when walked in the room. Head and neck benign. Chest and lungs clear. Extremities show trace edema with significant venous stasis changes of the lower extremities even at his young age         Assessment & Plan:   Diabetes Recommend weight loss  Recommend treatment for sleep apnea   Recommend discontinuation of pain medication Patient is encouraged to stop smoking. Advised pt on smoking cessation   Follow up in 3 mo  Obesity with sleep apnea Patient encouraged to try CPAP again Tobacco abuse Opioid addiction Patient trying to cut down through methadone clinic. He says he wants to quit.  I have done the exam and reviewed the chart and it is accurate to the best of my knowledge. Dentist has been used and  any errors in dictation or transcription are unintentional. Julieanne Manson M.D. [facility]

## 2017-05-22 ENCOUNTER — Other Ambulatory Visit: Payer: Self-pay | Admitting: Internal Medicine

## 2017-05-22 DIAGNOSIS — E119 Type 2 diabetes mellitus without complications: Secondary | ICD-10-CM

## 2017-06-06 ENCOUNTER — Ambulatory Visit: Payer: Self-pay | Admitting: Licensed Clinical Social Worker

## 2017-06-06 ENCOUNTER — Ambulatory Visit: Payer: Self-pay | Admitting: Physician Assistant

## 2017-06-06 ENCOUNTER — Ambulatory Visit: Payer: Self-pay

## 2017-06-06 VITALS — BP 142/81 | HR 100 | Temp 98.4°F | Wt 366.7 lb

## 2017-06-06 DIAGNOSIS — F411 Generalized anxiety disorder: Secondary | ICD-10-CM

## 2017-06-06 DIAGNOSIS — F331 Major depressive disorder, recurrent, moderate: Secondary | ICD-10-CM

## 2017-06-06 DIAGNOSIS — E119 Type 2 diabetes mellitus without complications: Secondary | ICD-10-CM

## 2017-06-06 LAB — GLUCOSE, POCT (MANUAL RESULT ENTRY): POC GLUCOSE: 281 mg/dL — AB (ref 70–99)

## 2017-06-06 MED ORDER — GABAPENTIN 600 MG PO TABS: 1200.0000 mg | ORAL_TABLET | Freq: Three times a day (TID) | ORAL | 1 refills | Status: DC
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
## 2017-06-06 NOTE — Progress Notes (Cosign Needed)
Patient's primary care provider: Zachery Dauer, FNP   Luis [name] returns for follow-up of uncontrolled T2DM with peripheral neuropathy. Diagnosed with diabetes 2 years ago, says he probably had it for a long time and didn't know.  Blood sugars still elevated like last visit 2 months ago, 200s consistently. Reports much better control with Trulicity, will hopefully receive from pharmacy soon. A1C today 12.1%  Neuropathy - needs gabapentin refill, will switch to 2x600mg  tablets three times daily. Will not escalate dose further.  Podiatry - still has large callous on left foot, trying to scrape away dead skin with pumice. Had a visit scheduled for podiatry 2 months ago, was unable to follow through. Doesn't have means to pay, willing to complete [facility] application.   Other problems include:  Patient Active Problem List   Diagnosis Date Noted  . Diabetes mellitus without complication (HCC) 12/01/2016  . Hypertension 12/01/2016  . Type 2 diabetes mellitus with diabetic neuropathy, without long-term current use of insulin (HCC) 12/01/2016    His current medications include: Current Outpatient Prescriptions  Medication Sig Dispense Refill  . empagliflozin (JARDIANCE) 10 MG TABS tablet Take 10 mg by mouth daily. 90 tablet 4  . gabapentin (NEURONTIN) 300 MG capsule TAKE 2 CAPSULES BY MOUTH EVERY MORNING, 3 CAPSULES MID DAY, AND 3 CAPSULES EVERY EVENING 240 capsule 0  . ibuprofen (ADVIL,MOTRIN) 200 MG tablet Take 200 mg by mouth every 6 (six) hours as needed.    Marland Kitchen lisinopril-hydrochlorothiazide (ZESTORETIC) 10-12.5 MG tablet Take 1 tablet by mouth daily. 90 tablet 1  . metFORMIN (GLUCOPHAGE) 500 MG tablet Take 2 tablets (1,000 mg total) by mouth 2 (two) times daily. 360 tablet 3  . methadone (DOLOPHINE) 10 MG/5ML solution Take by mouth.    . Dulaglutide (TRULICITY) 0.75 MG/0.5ML SOPN Inject 0.75 mg into the skin once a week. (Patient not taking: Reported on 06/06/2017) 4 pen 11   No  current facility-administered medications for this visit.      Exam:  BP (!) 142/81   Pulse 100   Temp 98.4 F (36.9 C)   Wt (!) 366 lb 11.2 oz (166.3 kg)   BMI 52.62 kg/m  Constitutional: Alert, oriented, in NAD Cardiovascular: Regular rhythm, no murmurs, gallops, rubs. Respiratory: Lungs clear, no wheezes or rales Skin: no rashes or lesions. Feet without lesions except extremely large dry cracked callus on left heel. Neurologic: Gait normal, DTRs normal. Tremor: No light touch sensation normal in feet in 5/5 spots bilaterally by monofilament Psychiatric: Oriented, normal mood and affect   Recent labs:  Results for orders placed or performed in visit on 06/06/17  POCT Glucose (CBG)  Result Value Ref Range   POC Glucose 281 (A) 70 - 99 mg/dl    Assessment and Plan:    1. Diabetes mellitus without complication (HCC)  No labs required today. Pt encouraged to exercise 15 minutes daily (walk every morning), encouraged to call [facility] for help with smoking cessation. Will follow-up in 2 months after initiating Trulicity and visiting podiatry.  Check out: Referral to [facility] sent, Lawson Fiscal will give [facility] application at clinic exit today.   No Follow-up on file.

## 2017-06-19 ENCOUNTER — Ambulatory Visit: Payer: Disability Insurance | Admitting: Podiatry

## 2017-06-19 DIAGNOSIS — E669 Obesity, unspecified: Secondary | ICD-10-CM | POA: Insufficient documentation

## 2017-06-29 ENCOUNTER — Ambulatory Visit: Payer: Self-pay | Admitting: Licensed Clinical Social Worker

## 2017-07-27 ENCOUNTER — Telehealth: Payer: Self-pay | Admitting: Pharmacist

## 2017-07-27 NOTE — Telephone Encounter (Signed)
07/27/17 Called [employer] spoke with Luis [name] to check on order -they shipped to patient 05/02/17 and I placed a refill today 07/27/17 this will ship to patient . Patient has 2 refills left, and enrollment till 03/01/2018.Forde Radon

## 2017-08-07 ENCOUNTER — Encounter: Payer: Self-pay | Admitting: Nurse Practitioner

## 2017-08-08 ENCOUNTER — Ambulatory Visit: Payer: Self-pay

## 2017-08-08 NOTE — Progress Notes (Deleted)
Luis [name] is 46 y.o. male returning for follow-up of uncontrolled T2DM with peripheral neuropathy that was diagnosed 2 years ago and complicated by several cardiovascular risk factors, including **.   Assessment and Plan:    1. Diabetes mellitus without complication (HCC)   Subjective  Diabetes mellitus without complication (HCC) Typically, blood sugars have ranged from ** with the highest number **.   Luis [name] acknowledges ** but denies    neuropathy, polydipsia, polyuria, gastroporesis  Current exercise regimen consists of **. Current diet regimen consist of **.   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     amoxicillin-clavulanate (AUGMENTIN) 875-125 MG tablet TK 1 T PO BID FOR 10 DAYS  0   Dulaglutide (TRULICITY) 0.75 MG/0.5ML SOPN Inject 0.75 mg into the skin once a week. (Patient not taking: Reported on 06/06/2017) 4 pen 11   empagliflozin (JARDIANCE) 10 MG TABS tablet Take 10 mg by mouth daily. 90 tablet 4   gabapentin (NEURONTIN) 300 MG capsule Take by mouth.     gabapentin (NEURONTIN) 600 MG tablet Take 2 tablets (1,200 mg total) by mouth 3 (three) times daily. 180 tablet 1   ibuprofen (ADVIL,MOTRIN) 200 MG tablet Take 200 mg by mouth every 6 (six) hours as needed.      lisinopril-hydrochlorothiazide (PRINZIDE,ZESTORETIC) 10-12.5 MG tablet Take by mouth.     lisinopril-hydrochlorothiazide (PRINZIDE,ZESTORETIC) 20-12.5 MG tablet Take by mouth.     lisinopril-hydrochlorothiazide (ZESTORETIC) 10-12.5 MG tablet Take 1 tablet by mouth daily. 90 tablet 1   metFORMIN (GLUCOPHAGE) 500 MG tablet Take 2 tablets (1,000 mg total) by mouth 2 (two) times daily. 360 tablet 3   metFORMIN (GLUCOPHAGE) 500 MG tablet Take 500 mg by mouth.     methadone (DOLOPHINE) 10 MG tablet Take by mouth.     methadone (DOLOPHINE) 10 MG tablet Take by mouth.     methadone (DOLOPHINE) 10 MG/5ML solution Take by mouth.     Oxycodone HCl 10 MG TABS Take 10 mg by mouth.     No current facility-administered medications for this visit.      Exam:  There were no vitals taken for this visit. Constitutional: Alert, oriented, in NAD Cardiovascular: Regular rhythm, no murmurs, gallops, rubs. Respiratory: Lungs clear, no wheezes or rales Skin: no rashes or lesions. Feet without lesions except extremely large dry cracked callus on left heel. Neurologic: Gait normal, DTRs normal. Tremor: No light touch sensation normal in feet in 5/5 spots bilaterally by monofilament Psychiatric: Oriented, normal mood and affect   Recent labs:  Results for orders placed or performed in visit on 06/06/17  POCT Glucose (CBG)  Result Value Ref Range   POC Glucose 281 (A) 70 - 99 mg/dl

## 2017-08-10 ENCOUNTER — Ambulatory Visit: Payer: Self-pay | Admitting: Family Medicine

## 2017-08-10 VITALS — BP 141/74 | HR 102 | Temp 97.9°F | Wt 362.2 lb

## 2017-08-10 DIAGNOSIS — G8929 Other chronic pain: Secondary | ICD-10-CM

## 2017-08-10 DIAGNOSIS — E119 Type 2 diabetes mellitus without complications: Secondary | ICD-10-CM

## 2017-08-10 DIAGNOSIS — I10 Essential (primary) hypertension: Secondary | ICD-10-CM

## 2017-08-10 DIAGNOSIS — M549 Dorsalgia, unspecified: Secondary | ICD-10-CM

## 2017-08-10 DIAGNOSIS — E1140 Type 2 diabetes mellitus with diabetic neuropathy, unspecified: Secondary | ICD-10-CM

## 2017-08-10 MED ORDER — LISINOPRIL-HYDROCHLOROTHIAZIDE 20-12.5 MG PO TABS: 1.0000 | ORAL_TABLET | Freq: Every day | ORAL | 0 refills | Status: DC

## 2017-08-10 MED ORDER — GABAPENTIN 600 MG PO TABS: 1200.0000 mg | ORAL_TABLET | Freq: Three times a day (TID) | ORAL | 1 refills | Status: DC

## 2017-08-10 MED ORDER — DULAGLUTIDE 0.75 MG/0.5ML ~~LOC~~ SOAJ: 0.7500 mg | SUBCUTANEOUS | 11 refills | Status: DC

## 2017-08-10 MED ORDER — EMPAGLIFLOZIN 10 MG PO TABS: 10.0000 mg | ORAL_TABLET | Freq: Every day | ORAL | 4 refills | Status: DC

## 2017-08-10 MED ORDER — METFORMIN HCL 500 MG PO TABS
1000.0000 mg | ORAL_TABLET | Freq: Two times a day (BID) | ORAL | 1 refills | Status: DC
Start: 2017-08-10 — End: 2019-01-31

## 2017-08-10 NOTE — Assessment & Plan Note (Signed)
Not sure what dose he's taking. Will make sure he's taking 20-12.5 of his lisinopril-hctz. Call with any concerns. BP should also drop with the jardiance. Call with any concerns.

## 2017-08-10 NOTE — Assessment & Plan Note (Signed)
Refill of his gabapentin given today. Call with any concerns.

## 2017-08-10 NOTE — Assessment & Plan Note (Signed)
Not under good control with A1c of 9.8. Likely worse right now as he has stopped his jardiance. Discussed the importance of taking his jardiance. Refill his medicine. Watch diet and exercise. Checking A1c today. Return 3 months with labs before.

## 2017-08-10 NOTE — Progress Notes (Signed)
BP (!) 141/74 (BP Location: Left Arm, Patient Position: Sitting, Cuff Size: Large)   Pulse (!) 102   Temp 97.9 F (36.6 C) (Oral)   Wt (!) 362 lb 3.2 oz (164.3 kg)   BMI 51.97 kg/m    Subjective:    Patient ID: Luis [name], male    DOB: 1970/09/02, 46 y.o.   MRN: [id number]  HPI: Luis [name] is a 46 y.o. male  Chief Complaint  Patient presents with  . Follow-up    refills on meds   DIABETES- stopped the jardiance because he was afraid of the commercials that were talking about scrotal gangrene Hypoglycemic episodes:no Polydipsia/polyuria: yes Visual disturbance: yes Chest pain: no Paresthesias: no Glucose Monitoring: yes  Accucheck frequency: BID  Fasting glucose: 200  Evening: 250s Taking Insulin?: no  HYPERTENSION Hypertension status: stable  Satisfied with current treatment? yes Duration of hypertension: chronic BP monitoring frequency:  not checking BP medication side effects:  no Medication compliance: fair compliance Previous BP meds:lisinopril-hctz Aspirin: no Recurrent headaches: no Visual changes: yes Palpitations: no Dyspnea: no Chest pain: no Lower extremity edema: no Dizzy/lightheaded: no   Relevant past medical, surgical, family and social history reviewed and updated as indicated. Interim medical history since our last visit reviewed. Allergies and medications reviewed and updated.  Review of Systems  Constitutional: Negative.   Respiratory: Negative.   Cardiovascular: Negative.   Neurological: Negative.   Psychiatric/Behavioral: Negative.     Per HPI unless specifically indicated above     Objective:    BP (!) 141/74 (BP Location: Left Arm, Patient Position: Sitting, Cuff Size: Large)   Pulse (!) 102   Temp 97.9 F (36.6 C) (Oral)   Wt (!) 362 lb 3.2 oz (164.3 kg)   BMI 51.97 kg/m   Wt Readings from Last 3 Encounters:  08/10/17 (!) 362 lb 3.2 oz (164.3 kg)  06/06/17 (!) 366 lb 11.2 oz (166.3 kg)  05/11/17 (!) 368  lb 8 oz (167.2 kg)    Physical Exam  Constitutional: He is oriented to person, place, and time. He appears well-developed and well-nourished. No distress.  HENT:  Head: Normocephalic and atraumatic.  Right Ear: Hearing normal.  Left Ear: Hearing normal.  Nose: Nose normal.  Eyes: Conjunctivae and lids are normal. Right eye exhibits no discharge. Left eye exhibits no discharge. No scleral icterus.  Cardiovascular: Normal rate, regular rhythm, normal heart sounds and intact distal pulses. Exam reveals no gallop and no friction rub.  No murmur heard. Pulmonary/Chest: Effort normal and breath sounds normal. No respiratory distress. He has no wheezes. He has no rales. He exhibits no tenderness.  Musculoskeletal: Normal range of motion.  Neurological: He is alert and oriented to person, place, and time.  Skin: Skin is warm, dry and intact. No rash noted. He is not diaphoretic. No erythema. No pallor.  Psychiatric: He has a normal mood and affect. His speech is normal and behavior is normal. Judgment and thought content normal. Cognition and memory are normal.  Nursing note and vitals reviewed.   Results for orders placed or performed in visit on 06/06/17  POCT Glucose (CBG)  Result Value Ref Range   POC Glucose 281 (A) 70 - 99 mg/dl      Assessment & Plan:   Problem List Items Addressed This Visit      Cardiovascular and Mediastinum   Hypertension - Primary    Not sure what dose he's taking. Will make sure he's taking 20-12.5 of his lisinopril-hctz.  Call with any concerns. BP should also drop with the jardiance. Call with any concerns.       Relevant Medications   lisinopril-hydrochlorothiazide (PRINZIDE,ZESTORETIC) 20-12.5 MG tablet   Other Relevant Orders   Comprehensive metabolic panel     Endocrine   Type 2 diabetes mellitus with diabetic neuropathy, without long-term current use of insulin (HCC)    Not under good control with A1c of 9.8. Likely worse right now as he has  stopped his jardiance. Discussed the importance of taking his jardiance. Refill his medicine. Watch diet and exercise. Checking A1c today. Return 3 months with labs before.       Relevant Medications   empagliflozin (JARDIANCE) 10 MG TABS tablet   Dulaglutide (TRULICITY) 0.75 MG/0.5ML SOPN   lisinopril-hydrochlorothiazide (PRINZIDE,ZESTORETIC) 20-12.5 MG tablet   metFORMIN (GLUCOPHAGE) 500 MG tablet   Other Relevant Orders   Lipid Panel w/o Chol/HDL Ratio   Comprehensive metabolic panel   Hgb A1c w/o eAG     Other   Chronic back pain    Refill of his gabapentin given today. Call with any concerns.       Relevant Orders   Comprehensive metabolic panel    Other Visit Diagnoses    Diabetes mellitus without complication (HCC)       Relevant Medications   empagliflozin (JARDIANCE) 10 MG TABS tablet   Dulaglutide (TRULICITY) 0.75 MG/0.5ML SOPN   gabapentin (NEURONTIN) 600 MG tablet   lisinopril-hydrochlorothiazide (PRINZIDE,ZESTORETIC) 20-12.5 MG tablet   metFORMIN (GLUCOPHAGE) 500 MG tablet   Other Relevant Orders   Comprehensive metabolic panel       Follow up plan: Return in about 3 months (around 11/08/2017), or for diabetes follow up with labs before hand (labs in computer).

## 2017-08-11 LAB — COMPREHENSIVE METABOLIC PANEL
A/G RATIO: 1.6 (ref 1.2–2.2)
ALBUMIN: 4.2 g/dL (ref 3.5–5.5)
ALT: 23 IU/L (ref 0–44)
AST: 13 IU/L (ref 0–40)
Alkaline Phosphatase: 88 IU/L (ref 39–117)
BUN / CREAT RATIO: 15 (ref 9–20)
BUN: 14 mg/dL (ref 6–24)
CHLORIDE: 94 mmol/L — AB (ref 96–106)
CO2: 24 mmol/L (ref 20–29)
Calcium: 9.5 mg/dL (ref 8.7–10.2)
Creatinine, Ser: 0.92 mg/dL (ref 0.76–1.27)
GFR calc non Af Amer: 100 mL/min/{1.73_m2} (ref >59)
GFR, EST AFRICAN AMERICAN: 116 mL/min/{1.73_m2} (ref >59)
GLUCOSE: 337 mg/dL — AB (ref 65–99)
Globulin, Total: 2.6 g/dL (ref 1.5–4.5)
POTASSIUM: 5.1 mmol/L (ref 3.5–5.2)
Sodium: 132 mmol/L — ABNORMAL LOW (ref 134–144)
Total Protein: 6.8 g/dL (ref 6.0–8.5)

## 2017-08-11 LAB — LIPID PANEL W/O CHOL/HDL RATIO
CHOLESTEROL TOTAL: 233 mg/dL — AB (ref 100–199)
HDL: 26 mg/dL — ABNORMAL LOW (ref >39)
TRIGLYCERIDES: 495 mg/dL — AB (ref 0–149)

## 2017-08-11 LAB — HGB A1C W/O EAG: HEMOGLOBIN A1C: 12 % — AB (ref 4.8–5.6)

## 2017-08-30 ENCOUNTER — Telehealth: Payer: Self-pay | Admitting: Licensed Clinical Social Worker

## 2017-08-30 NOTE — Telephone Encounter (Signed)
Clinician contacted the patient due to cancelling his appointment in November of 2018 and left a message to see if he would like to reschedule.

## 2017-11-09 ENCOUNTER — Other Ambulatory Visit: Payer: Self-pay

## 2017-11-09 DIAGNOSIS — E1140 Type 2 diabetes mellitus with diabetic neuropathy, unspecified: Secondary | ICD-10-CM

## 2017-11-09 DIAGNOSIS — I10 Essential (primary) hypertension: Secondary | ICD-10-CM

## 2017-11-10 LAB — COMPREHENSIVE METABOLIC PANEL
ALBUMIN: 4 g/dL (ref 3.5–5.5)
ALT: 19 IU/L (ref 0–44)
AST: 8 IU/L (ref 0–40)
Albumin/Globulin Ratio: 1.3 (ref 1.2–2.2)
Alkaline Phosphatase: 89 IU/L (ref 39–117)
BUN / CREAT RATIO: 16 (ref 9–20)
BUN: 14 mg/dL (ref 6–24)
CALCIUM: 10 mg/dL (ref 8.7–10.2)
CHLORIDE: 94 mmol/L — AB (ref 96–106)
CO2: 27 mmol/L (ref 20–29)
Creatinine, Ser: 0.85 mg/dL (ref 0.76–1.27)
GFR calc non Af Amer: 105 mL/min/{1.73_m2} (ref >59)
GFR, EST AFRICAN AMERICAN: 122 mL/min/{1.73_m2} (ref >59)
GLUCOSE: 268 mg/dL — AB (ref 65–99)
Globulin, Total: 3.1 g/dL (ref 1.5–4.5)
Potassium: 5.4 mmol/L — ABNORMAL HIGH (ref 3.5–5.2)
Sodium: 135 mmol/L (ref 134–144)
Total Protein: 7.1 g/dL (ref 6.0–8.5)

## 2017-11-10 LAB — HGB A1C W/O EAG: Hgb A1c MFr Bld: 11.3 % — ABNORMAL HIGH (ref 4.8–5.6)

## 2017-11-16 ENCOUNTER — Ambulatory Visit: Payer: Self-pay

## 2017-11-23 ENCOUNTER — Ambulatory Visit: Payer: Self-pay | Admitting: Family Medicine

## 2017-11-23 VITALS — BP 135/74 | HR 90 | Temp 97.9°F | Wt 342.6 lb

## 2017-11-23 DIAGNOSIS — I10 Essential (primary) hypertension: Secondary | ICD-10-CM

## 2017-11-23 DIAGNOSIS — E119 Type 2 diabetes mellitus without complications: Secondary | ICD-10-CM

## 2017-11-23 MED ORDER — DULAGLUTIDE 0.75 MG/0.5ML ~~LOC~~ SOAJ: 0.7500 mg | SUBCUTANEOUS | 1 refills | Status: DC

## 2017-11-23 MED ORDER — GLIMEPIRIDE 4 MG PO TABS: 4.0000 mg | ORAL_TABLET | Freq: Every day | ORAL | 1 refills | Status: DC

## 2017-11-23 NOTE — Progress Notes (Signed)
Subjective:    Patient ID: Luis [name], male    DOB: 1971/08/16, 47 y.o.   MRN: [id number]  HPI  Aristides Gross is a 47 yo male here for 3 mo f/u of diabetes and lab results. He presents w/ productive cough for 1 week from the flu; symptoms are resolving and he has no SOB or significant cough..  Diabetes: pt self-reports blood glucose 150-200 in the morning. Pt reports he does not regularly check his sugars. Pt reports he hasn't been taking his Jardiance b/c of fear of side affects. He reports he takes 1 tablet Metformin in morning and takes the 2nd later but he is not taking 2 BID. He endorses injecting Trulicity weekly. Diet: pt reports he has not been controlling. Exercise: pt reports he has not been exercising. Pt endorses frequent urination.   HTN: BP is controlled tonight.  Smoking: pt reports he wants to quit.   Patient Active Problem List   Diagnosis Date Noted  . Obesity, unspecified 06/19/2017  . Hypertension 12/01/2016  . Type 2 diabetes mellitus with diabetic neuropathy, without long-term current use of insulin (HCC) 12/01/2016  . 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 OSA (obstructive sleep apnea) 05/06/2013  . Chronic back pain 12/25/2011   Allergies as of 11/23/2017   No Known Allergies     Medication List        Accurate as of 11/23/17  6:48 PM. Always use your most recent med list.          Dulaglutide 0.75 MG/0.5ML Sopn Commonly known as:  TRULICITY Inject 0.75 mg into the skin once a week.   empagliflozin 10 MG Tabs tablet Commonly known as:  JARDIANCE Take 10 mg by mouth daily.   gabapentin 600 MG tablet Commonly known as:  NEURONTIN Take 2 tablets (1,200 mg total) by mouth 3 (three) times daily.   ibuprofen 200 MG tablet Commonly known as:  ADVIL,MOTRIN Take 200 mg by mouth every 6 (six) hours as needed.   lisinopril-hydrochlorothiazide 20-12.5 MG tablet Commonly known as:  PRINZIDE,ZESTORETIC Take 1 tablet by mouth daily.   metFORMIN 500 MG tablet Commonly known as:  GLUCOPHAGE Take 2 tablets (1,000 mg total) by mouth 2 (two) times daily.   methadone 10 MG/5ML solution Commonly known as:  DOLOPHINE Take by mouth.   methadone 10 MG tablet Commonly known as:  DOLOPHINE Take by mouth.   methadone 10 MG tablet Commonly known as:  DOLOPHINE Take by mouth.        Review of Systems I have reviewed all labs. A1C is 11.3 and Potassium is elevated at 5.4. All other systems are negative. Discussed   Smoking cessation resources provided.     Objective:   Physical Exam  Constitutional: He is oriented to person, place, and time. He appears well-developed and well-nourished.  Cardiovascular: Normal rate, regular rhythm and normal heart sounds.  Pulmonary/Chest: Effort normal and breath sounds normal.  Neurological: He is alert and oriented to person, place, and time.  Vitals reviewed.   BP 135/74 (BP Location: Left Arm, Patient Position: Sitting, Cuff Size: Large)   Pulse 90   Temp 97.9 F (36.6 C)   Wt (!) 342 lb 9.6 oz (155.4 kg)   BMI 49.16 kg/m        Assessment & Plan:   Diabetes: Not controlled.  Restart Jardiance. Pt will take Metformin as Rx 1000mg  BID.  Rx Glimepride 4mg  daily. Continue Trulicity weekly. Pt is going to work on diet  and exercise and will continue to monitor for need of insulin. Pt is going to closely monitor blood glucose. At 3 mo f/u, will evaluate for need of insulin and need to see Endo Clinic.  In 3 weeks, if blood glucose is elevated, pt will call to schedule apt   HTN: Controlled. Continue to monitor.   F/u in 3 mo for diabetes w/ labs:

## 2017-11-23 NOTE — Patient Instructions (Signed)
Res

## 2017-12-18 ENCOUNTER — Ambulatory Visit: Payer: Disability Insurance | Admitting: Pharmacy Technician

## 2017-12-18 DIAGNOSIS — Z79899 Other long term (current) drug therapy: Secondary | ICD-10-CM

## 2017-12-18 NOTE — Progress Notes (Signed)
Met with patient to complete paperwork for recertification.  Patient still needs to provide notarized letter of support and utility bill from Mother.  [facility]

## 2017-12-22 ENCOUNTER — Ambulatory Visit: Payer: Disability Insurance

## 2018-01-10 ENCOUNTER — Other Ambulatory Visit: Payer: Self-pay | Admitting: Internal Medicine

## 2018-01-11 MED ORDER — LISINOPRIL-HYDROCHLOROTHIAZIDE 20-12.5 MG PO TABS: 1.0000 | ORAL_TABLET | Freq: Every day | ORAL | 0 refills | Status: DC

## 2018-01-16 ENCOUNTER — Other Ambulatory Visit: Payer: Self-pay | Admitting: Internal Medicine

## 2018-01-17 ENCOUNTER — Telehealth: Payer: Self-pay | Admitting: Pharmacy Technician

## 2018-01-17 NOTE — Telephone Encounter (Signed)
Received updated proof of income.  Patient eligible to receive medication assistance at [facility] through 2019, as long as eligibility requirements continue to be met.  [facility]

## 2018-01-24 ENCOUNTER — Other Ambulatory Visit: Payer: Self-pay | Admitting: Internal Medicine

## 2018-01-24 DIAGNOSIS — E119 Type 2 diabetes mellitus without complications: Secondary | ICD-10-CM

## 2018-02-16 ENCOUNTER — Telehealth: Payer: Self-pay | Admitting: Pharmacist

## 2018-02-16 NOTE — Telephone Encounter (Signed)
02/16/2018 11:39:31 AM - [place] Pepper pending  02/16/18 I have received the provider portion back sign, holding for patient to return his portion-mailed to patient 01/10/18.Forde Radon

## 2018-02-19 ENCOUNTER — Other Ambulatory Visit: Payer: Self-pay | Admitting: Adult Health Nurse Practitioner

## 2018-02-19 DIAGNOSIS — E119 Type 2 diabetes mellitus without complications: Secondary | ICD-10-CM

## 2018-02-20 ENCOUNTER — Other Ambulatory Visit: Payer: Self-pay | Admitting: Adult Health Nurse Practitioner

## 2018-02-20 ENCOUNTER — Other Ambulatory Visit: Payer: Medicaid Other

## 2018-02-20 DIAGNOSIS — F1120 Opioid dependence, uncomplicated: Secondary | ICD-10-CM

## 2018-02-20 DIAGNOSIS — E119 Type 2 diabetes mellitus without complications: Secondary | ICD-10-CM

## 2018-02-21 LAB — COMPREHENSIVE METABOLIC PANEL
ALT: 18 IU/L (ref 0–44)
AST: 11 IU/L (ref 0–40)
Albumin/Globulin Ratio: 1.3 (ref 1.2–2.2)
Albumin: 4 g/dL (ref 3.5–5.5)
Alkaline Phosphatase: 71 IU/L (ref 39–117)
BUN/Creatinine Ratio: 13 (ref 9–20)
BUN: 10 mg/dL (ref 6–24)
Bilirubin Total: <0.2 mg/dL (ref 0.0–1.2)
CO2: 24 mmol/L (ref 20–29)
Calcium: 9.7 mg/dL (ref 8.7–10.2)
Chloride: 98 mmol/L (ref 96–106)
Creatinine, Ser: 0.76 mg/dL (ref 0.76–1.27)
GFR, EST AFRICAN AMERICAN: 127 mL/min/{1.73_m2} (ref >59)
GFR, EST NON AFRICAN AMERICAN: 110 mL/min/{1.73_m2} (ref >59)
GLUCOSE: 290 mg/dL — AB (ref 65–99)
Globulin, Total: 3 g/dL (ref 1.5–4.5)
Potassium: 5.1 mmol/L (ref 3.5–5.2)
Sodium: 140 mmol/L (ref 134–144)
TOTAL PROTEIN: 7 g/dL (ref 6.0–8.5)

## 2018-02-21 LAB — CBC WITH DIFFERENTIAL/PLATELET
BASOS ABS: 0.1 10*3/uL (ref 0.0–0.2)
Basos: 0 %
EOS (ABSOLUTE): 0.3 10*3/uL (ref 0.0–0.4)
Eos: 2 %
HEMOGLOBIN: 15.2 g/dL (ref 13.0–17.7)
Hematocrit: 44.5 % (ref 37.5–51.0)
IMMATURE GRANS (ABS): 0.1 10*3/uL (ref 0.0–0.1)
IMMATURE GRANULOCYTES: 1 %
LYMPHS: 36 %
Lymphocytes Absolute: 5.2 10*3/uL — ABNORMAL HIGH (ref 0.7–3.1)
MCH: 29.2 pg (ref 26.6–33.0)
MCHC: 34.2 g/dL (ref 31.5–35.7)
MCV: 86 fL (ref 79–97)
MONOCYTES: 6 %
Monocytes Absolute: 0.9 10*3/uL (ref 0.1–0.9)
NEUTROS ABS: 8 10*3/uL — AB (ref 1.4–7.0)
NEUTROS PCT: 55 %
PLATELETS: 363 10*3/uL (ref 150–450)
RBC: 5.2 x10E6/uL (ref 4.14–5.80)
RDW: 14.6 % (ref 12.3–15.4)
WBC: 14.4 10*3/uL — AB (ref 3.4–10.8)

## 2018-02-21 LAB — HEMOGLOBIN A1C
Est. average glucose Bld gHb Est-mCnc: 217 mg/dL
HEMOGLOBIN A1C: 9.2 % — AB (ref 4.8–5.6)

## 2018-02-21 LAB — MICROALBUMIN / CREATININE URINE RATIO

## 2018-02-21 LAB — LIPID PANEL
Chol/HDL Ratio: 9.7 ratio — ABNORMAL HIGH (ref 0.0–5.0)
Cholesterol, Total: 253 mg/dL — ABNORMAL HIGH (ref 100–199)
HDL: 26 mg/dL — AB (ref >39)
TRIGLYCERIDES: 480 mg/dL — AB (ref 0–149)

## 2018-02-22 LAB — DRUG SCREEN, URINE
Amphetamines, Urine: NEGATIVE ng/mL
BENZODIAZEPINE QUANT UR: NEGATIVE ng/mL
Barbiturate screen, urine: NEGATIVE ng/mL
CANNABINOID QUANT UR: NEGATIVE ng/mL
Cocaine (Metab.): NEGATIVE ng/mL
Opiate Quant, Ur: NEGATIVE ng/mL
PCP QUANT UR: NEGATIVE ng/mL

## 2018-03-01 ENCOUNTER — Ambulatory Visit: Payer: Medicaid Other | Admitting: Family Medicine

## 2018-03-01 VITALS — BP 133/80 | HR 89 | Temp 98.3°F | Ht 67.0 in | Wt 358.6 lb

## 2018-03-01 DIAGNOSIS — Z09 Encounter for follow-up examination after completed treatment for conditions other than malignant neoplasm: Secondary | ICD-10-CM

## 2018-03-01 DIAGNOSIS — M549 Dorsalgia, unspecified: Secondary | ICD-10-CM

## 2018-03-01 DIAGNOSIS — K219 Gastro-esophageal reflux disease without esophagitis: Secondary | ICD-10-CM

## 2018-03-01 DIAGNOSIS — E1140 Type 2 diabetes mellitus with diabetic neuropathy, unspecified: Secondary | ICD-10-CM

## 2018-03-01 DIAGNOSIS — G8929 Other chronic pain: Secondary | ICD-10-CM

## 2018-03-01 DIAGNOSIS — I10 Essential (primary) hypertension: Secondary | ICD-10-CM

## 2018-03-01 MED ORDER — RANITIDINE HCL 150 MG PO TABS: 150.0000 mg | ORAL_TABLET | Freq: Two times a day (BID) | ORAL | 1 refills | Status: DC

## 2018-03-01 NOTE — Progress Notes (Signed)
Subjective:    Patient ID: Luis [name], male    DOB: 1970/11/01, 47 y.o.   MRN: [id number]   PCP: Raliegh Ip, NP  Chief Complaint  Patient presents with  . Follow-up     HPI   Current Status: Since her last office visit, she is doing well with no complaints. She denies fevers, chills, fatigue, recent infections, weight loss, and night sweats. She has not had any headaches, visual changes, dizziness, and falls. No chest pain, heart palpitations, cough and shortness of breath reported. No reports of GI problems such as nausea, vomiting, diarrhea, and constipation. She has no reports of blood in stools, dysuria and hematuria. No depression or anxiety, and denies suicidal ideations, homicidal ideations, or auditory hallucinations. She denies pain today.   Past Medical History:  Diagnosis Date  . Back pain   . Diabetes mellitus without complication (HCC)   . Hypertension   . Neuropathy     Family History  Problem Relation Age of Onset  . Cancer Mother   . Heart disease Father   . Diabetes Paternal Grandfather     Social History   Socioeconomic History  . Marital status: Divorced    Spouse name: Not on file  . Number of children: Not on file  . Years of education: Not on file  . Highest education level: Not on file  Occupational History  . Not on file  Social Needs  . Financial resource strain: Hard  . Food insecurity:    Worry: Never true    Inability: Never true  . Transportation needs:    Medical: No    Non-medical: No  Tobacco Use  . Smoking status: Current Every Day Smoker    Packs/day: 1.25    Types: Cigarettes  . Smokeless tobacco: Never Used  Substance and Sexual Activity  . Alcohol use: No  . Drug use: No  . Sexual activity: Not on file  Lifestyle  . Physical activity:    Days per week: 0 days    Minutes per session: 0 min  . Stress: Very much  Relationships  . Social connections:    Talks on phone: More than three times a week    Gets  together: More than three times a week    Attends religious service: More than 4 times per year    Active member of club or organization: No    Attends meetings of clubs or organizations: Never    Relationship status: Divorced  . Intimate partner violence:    Fear of current or ex partner: No    Emotionally abused: No    Physically abused: No    Forced sexual activity: No  Other Topics Concern  . Not on file  Social History Narrative  . Not on file    Past Surgical History:  Procedure Laterality Date  . KNEE SURGERY Right     There is no immunization history on file for this patient.  Current Meds  Medication Sig  . Dulaglutide (TRULICITY) 0.75 MG/0.5ML SOPN Inject 0.75 mg into the skin once a week.  . empagliflozin (JARDIANCE) 10 MG TABS tablet Take 10 mg by mouth daily.  Marland Kitchen gabapentin (NEURONTIN) 600 MG tablet TAKE TWO TABLETS (1200MG ) BY MOUTH 3 TIMES A DAY  . glimepiride (AMARYL) 4 MG tablet Take 1 tablet (4 mg total) by mouth daily with breakfast.  . ibuprofen (ADVIL,MOTRIN) 200 MG tablet Take 200 mg by mouth every 6 (six) hours as needed.  Marland Kitchen lisinopril-hydrochlorothiazide (  PRINZIDE,ZESTORETIC) 20-12.5 MG tablet Take 1 tablet by mouth daily.  . metFORMIN (GLUCOPHAGE) 500 MG tablet Take 2 tablets (1,000 mg total) by mouth 2 (two) times daily.  . methadone (DOLOPHINE) 10 MG tablet Take by mouth.  . methadone (DOLOPHINE) 10 MG tablet Take by mouth.  . methadone (DOLOPHINE) 10 MG/5ML solution Take by mouth.   No Known Allergies  BP 133/80   Pulse 89   Temp 98.3 F (36.8 C)   Ht 5\' 7"  (1.702 m)   Wt (!) 358 lb 9.6 oz (162.7 kg)   BMI 56.16 kg/m      Review of Systems  Neurological: Positive for numbness (hands and feet).   Objective:   Physical Exam  Constitutional: He is oriented to person, place, and time. He appears well-developed and well-nourished.  HENT:  Head: Normocephalic.  Right Ear: External ear normal.  Left Ear: External ear normal.  Nose: Nose  normal.  Mouth/Throat: Oropharynx is clear and moist.  Eyes: Pupils are equal, round, and reactive to light. Conjunctivae and EOM are normal.  Neck: Normal range of motion. Neck supple.  Cardiovascular: Normal rate, regular rhythm and normal heart sounds.  Pulmonary/Chest: Effort normal and breath sounds normal.  Abdominal: Soft. Bowel sounds are normal.  Musculoskeletal: Normal range of motion.  Neurological: He is alert and oriented to person, place, and time.  Skin: Skin is warm and dry. Capillary refill takes less than 2 seconds.  Psychiatric: He has a normal mood and affect. His behavior is normal. Judgment and thought content normal.  Nursing note and vitals reviewed.  Assessment & Plan:   1. Gastroesophageal reflux disease without esophagitis We will initiate Ranitidine today.   2. Essential hypertension Blood pressure is stable today at 133/80. Continue Prinzide as prescribed.   3. Chronic back pain, unspecified back location, unspecified back pain laterality Stable. Not worsening. Continue Neurontin as pescribed.   4. Type 2 diabetes mellitus with diabetic neuropathy, without long-term current use of insulin (HCC) Hgb A1c improved at 9.2 on 02/20/2018, from 11.3 on 11/09/2017.   5. Follow up He will follow up in 1 month.   Meds ordered this encounter  Medications  . ranitidine (ZANTAC) 150 MG tablet    Sig: Take 1 tablet (150 mg total) by mouth 2 (two) times daily.    Dispense:  60 tablet    Refill:  1   Raliegh Ip,  MSN, FNP-C [facility] [address] [place], [place] [address] [phone]

## 2018-03-07 LAB — MICROALBUMIN / CREATININE URINE RATIO

## 2018-03-07 LAB — SPECIMEN STATUS REPORT

## 2018-03-19 ENCOUNTER — Other Ambulatory Visit: Payer: Self-pay | Admitting: Adult Health Nurse Practitioner

## 2018-03-19 DIAGNOSIS — E119 Type 2 diabetes mellitus without complications: Secondary | ICD-10-CM

## 2018-04-05 ENCOUNTER — Ambulatory Visit: Payer: Medicaid Other | Admitting: Adult Health Nurse Practitioner

## 2018-04-05 VITALS — BP 148/75 | HR 94 | Temp 98.6°F | Ht 70.0 in | Wt 357.1 lb

## 2018-04-05 DIAGNOSIS — E1140 Type 2 diabetes mellitus with diabetic neuropathy, unspecified: Secondary | ICD-10-CM

## 2018-04-05 NOTE — Progress Notes (Signed)
Subjective:    Patient ID: Luis [name], male    DOB: 23-Mar-1971, 47 y.o.   MRN: [id number]  HPI  Luis [name] is a 47 yo M here for f/u of GERD and Neuropathy in hands. He was seen at the ED on 7/14 for dental pain.  GERD: He was started on Zantac at last visit and pt states it is resolved. Neuropathy: Pt reports continued pain but stable w/ Gabapentin.  DM: He states he was not able to get Trulicity from pharmacy.  Dental pain: He has not been to the dentist yet but will f/u.   Patient Active Problem List   Diagnosis Date Noted  . Obesity, unspecified 06/19/2017  . Hypertension 12/01/2016  . Type 2 diabetes mellitus with diabetic neuropathy, without long-term current use of insulin (HCC) 12/01/2016  . 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 OSA (obstructive sleep apnea) 05/06/2013  . Chronic back pain 12/25/2011   Allergies as of 04/05/2018   No Known Allergies     Medication List        Accurate as of 04/05/18  7:11 PM. Always use your most recent med list.          Dulaglutide 0.75 MG/0.5ML Sopn Inject 0.75 mg into the skin once a week.   empagliflozin 10 MG Tabs tablet Commonly known as:  JARDIANCE Take 10 mg by mouth daily.   gabapentin 600 MG tablet Commonly known as:  NEURONTIN TAKE TWO TABLETS (1200MG ) BY MOUTH 3 TIMES A DAY   glimepiride 4 MG tablet Commonly known as:  AMARYL Take 1 tablet (4 mg total) by mouth daily with breakfast.   ibuprofen 200 MG tablet Commonly known as:  ADVIL,MOTRIN Take 200 mg by mouth every 6 (six) hours as needed.   lisinopril-hydrochlorothiazide 20-12.5 MG tablet Commonly known as:  PRINZIDE,ZESTORETIC Take 1 tablet by mouth daily.   metFORMIN 500 MG tablet Commonly known as:  GLUCOPHAGE Take 2 tablets (1,000 mg total) by mouth 2 (two) times daily.   methadone 10 MG/5ML solution Commonly known as:  DOLOPHINE Take by mouth.   methadone 10 MG tablet Commonly known as:  DOLOPHINE Take by  mouth.   methadone 10 MG tablet Commonly known as:  DOLOPHINE Take by mouth.   ranitidine 150 MG tablet Commonly known as:  ZANTAC Take 1 tablet (150 mg total) by mouth 2 (two) times daily.        Review of Systems  All other systems reviewed and are negative.      Objective:   Physical Exam  Constitutional: He is oriented to person, place, and time. He appears well-developed and well-nourished.  Cardiovascular: Normal rate, regular rhythm and normal heart sounds.  Pulmonary/Chest: Effort normal and breath sounds normal.  Abdominal: Soft. Bowel sounds are normal.  Neurological: He is alert and oriented to person, place, and time.  Skin: Skin is warm and dry.  Psychiatric: He has a normal mood and affect. His behavior is normal. Judgment and thought content normal.  Vitals reviewed.   BP (!) 148/75   Pulse 94   Temp 98.6 F (37 C)   Ht 5\' 10"  (1.778 m)   Wt (!) 357 lb 1.6 oz (162 kg)   BMI 51.24 kg/m        Assessment & Plan:   GERD: Controlled. Continue Zantac w/ meals. At next visit, evaluate decreasing to once daily.   DM w/ neuropathy: Neuropathy pain continued but stable. Continue Gabapentin as rx.  Unable to  get Lyric due to cost and controlled substance as he is at the Methadone clinic.  Will check in to why he couldn't get Trulicity.   F/u in 3 mo w/ labs 1 week prior.

## 2018-04-10 ENCOUNTER — Telehealth: Payer: Self-pay | Admitting: Pharmacist

## 2018-04-10 NOTE — Telephone Encounter (Signed)
04/10/2018 11:18:48 AM - Trulicity Call to [facility]  04/10/18 I was asked to check on the Trulicity refill order I placed 01/18/18 and did not receive. I called [facility] spoke with Barbara Cower, he reviewed and said the order was cancelled-no known reason, possibly just not completed. He stated the script was still good till 04/12/18, he processed while on the phone to ship to our address-allow 7-10 days.Forde Radon

## 2018-04-24 ENCOUNTER — Other Ambulatory Visit: Payer: Self-pay | Admitting: Adult Health Nurse Practitioner

## 2018-04-24 DIAGNOSIS — E119 Type 2 diabetes mellitus without complications: Secondary | ICD-10-CM

## 2018-05-01 ENCOUNTER — Other Ambulatory Visit: Payer: Self-pay | Admitting: Internal Medicine

## 2018-05-18 ENCOUNTER — Other Ambulatory Visit: Payer: Self-pay | Admitting: Internal Medicine

## 2018-05-18 ENCOUNTER — Other Ambulatory Visit: Payer: Self-pay | Admitting: Adult Health Nurse Practitioner

## 2018-05-18 DIAGNOSIS — E119 Type 2 diabetes mellitus without complications: Secondary | ICD-10-CM

## 2018-05-23 ENCOUNTER — Other Ambulatory Visit: Payer: Self-pay | Admitting: Internal Medicine

## 2018-06-21 ENCOUNTER — Other Ambulatory Visit: Payer: Self-pay | Admitting: Internal Medicine

## 2018-06-28 ENCOUNTER — Other Ambulatory Visit: Payer: Medicaid Other

## 2018-06-28 DIAGNOSIS — E1140 Type 2 diabetes mellitus with diabetic neuropathy, unspecified: Secondary | ICD-10-CM

## 2018-06-29 LAB — LDL CHOLESTEROL, DIRECT: LDL DIRECT: 140 mg/dL — AB (ref 0–99)

## 2018-06-29 LAB — HEMOGLOBIN A1C
ESTIMATED AVERAGE GLUCOSE: 220 mg/dL
Hgb A1c MFr Bld: 9.3 % — ABNORMAL HIGH (ref 4.8–5.6)

## 2018-06-29 LAB — COMPREHENSIVE METABOLIC PANEL
A/G RATIO: 1.4 (ref 1.2–2.2)
ALT: 18 IU/L (ref 0–44)
AST: 11 IU/L (ref 0–40)
Albumin: 4.1 g/dL (ref 3.5–5.5)
Alkaline Phosphatase: 78 IU/L (ref 39–117)
BUN/Creatinine Ratio: 23 — ABNORMAL HIGH (ref 9–20)
BUN: 19 mg/dL (ref 6–24)
CALCIUM: 9.9 mg/dL (ref 8.7–10.2)
CHLORIDE: 97 mmol/L (ref 96–106)
CO2: 24 mmol/L (ref 20–29)
Creatinine, Ser: 0.84 mg/dL (ref 0.76–1.27)
GFR, EST AFRICAN AMERICAN: 121 mL/min/{1.73_m2} (ref >59)
GFR, EST NON AFRICAN AMERICAN: 105 mL/min/{1.73_m2} (ref >59)
GLUCOSE: 219 mg/dL — AB (ref 65–99)
Globulin, Total: 3 g/dL (ref 1.5–4.5)
POTASSIUM: 5.4 mmol/L — AB (ref 3.5–5.2)
Sodium: 137 mmol/L (ref 134–144)
TOTAL PROTEIN: 7.1 g/dL (ref 6.0–8.5)

## 2018-07-05 ENCOUNTER — Ambulatory Visit: Payer: Medicaid Other | Admitting: Family Medicine

## 2018-07-05 VITALS — BP 139/72 | HR 95 | Temp 98.3°F | Wt 364.5 lb

## 2018-07-05 DIAGNOSIS — I10 Essential (primary) hypertension: Secondary | ICD-10-CM

## 2018-07-05 DIAGNOSIS — K219 Gastro-esophageal reflux disease without esophagitis: Secondary | ICD-10-CM

## 2018-07-05 DIAGNOSIS — E785 Hyperlipidemia, unspecified: Secondary | ICD-10-CM

## 2018-07-05 DIAGNOSIS — M549 Dorsalgia, unspecified: Secondary | ICD-10-CM

## 2018-07-05 DIAGNOSIS — E1140 Type 2 diabetes mellitus with diabetic neuropathy, unspecified: Secondary | ICD-10-CM

## 2018-07-05 DIAGNOSIS — G8929 Other chronic pain: Secondary | ICD-10-CM

## 2018-07-05 MED ORDER — FAMOTIDINE 20 MG PO TABS: 20.0000 mg | ORAL_TABLET | Freq: Two times a day (BID) | ORAL | 0 refills | Status: DC

## 2018-07-05 MED ORDER — ATORVASTATIN CALCIUM 40 MG PO TABS: 40.0000 mg | ORAL_TABLET | Freq: Every day | ORAL | 0 refills | Status: DC

## 2018-07-05 NOTE — Assessment & Plan Note (Addendum)
A1c still above goal, but he's reporting improved BG's in the low 100's Doesn't correlate well with his A1c He's no longer taking jardiance due to concern abt side effects Discussed follow up in 1 month, bring glucometer, continue to follow BG's He'll need adjustment at next visit  Lab Results  Component Value Date   HGBA1C 9.3 (H) 06/28/2018    He's due for an eye exam and foot exam

## 2018-07-05 NOTE — Assessment & Plan Note (Signed)
Transition to pepcid from ranitidine

## 2018-07-05 NOTE — Progress Notes (Signed)
Name: Luis [name]   MRN: [id number]    DOB: 1971/07/20   Date:07/06/2018       Progress Note Subjective  Chief Complaint  Chief Complaint  Patient presents with  . Follow-up  . Back Pain    Left sided lower back pain    HPI  Just here for 3 month follow up No particular concerns  DM, neuropathy - DM doing well, got back on trulicity and his BG are running pretty good.  BG around 150 on bad day.  Usually around 115-120.  Checking BG 3 times Yaqub Arney day. Takes trulicity, glimeperide, metformin - doesn't have any refills on.   Not taking jardiance any more, worried about side effects  Neuropathy - does ok with gabapentin.  Hands and feet.  Stinging/burning.  Present for "Elah Avellino while now".    GERD - taking ranitidine  Back pain - he fell Jay Haskew few months ago.  Going to see PT.  First appointment next month.  Thinks he hurt his back too.  Up and about, gets pain.  L side, burns down back of leg.  Resting makes it better.  No cancer, IVDU, fevers.  No weakness.  Several months.  Ibuprofen helps.  No pain at this time, but aggravates with activity.  HTN: doesn't check BP at home.  No CP, no SOB. HCTz 12.5 lsinopril 20  Methadone: hx opiate addiction.  Now goes to clinic in [place] Maintenance Due  Topic Date Due  . PNEUMOCOCCAL POLYSACCHARIDE VACCINE AGE 79-64 HIGH RISK  02/25/1974  . FOOT EXAM  02/25/1982  . OPHTHALMOLOGY EXAM  02/25/1982  . HIV Screening  02/26/1987  . TETANUS/TDAP  02/26/1991   Type 2 diabetes mellitus with diabetic neuropathy, without long-term current use of insulin (HCC) A1c still above goal, but he's reporting improved BG's in the low 100's Doesn't correlate well with his A1c He's no longer taking jardiance due to concern abt side effects Discussed follow up in 1 month, bring glucometer, continue to follow BG's He'll need adjustment at next visit  Lab Results  Component Value Date   HGBA1C 9.3 (H) 06/28/2018    He's due for an eye exam and foot  exam     Chronic back pain He notes L sided back pain which seems to be chronic No pain today, but notes pain with activity He's going to see PT soon and is planning to discuss this with them Continue to monitor for now  GERD (gastroesophageal reflux disease) Transition to pepcid from ranitidine  Hypertension BP 139/72, above goal Continue to monitor at this point, consider increasing HCTZ or lisinopril at next visit for goal <130/80  Repeat BMP given mildly elevated K   BP Readings from Last 3 Encounters:  07/05/18 139/72  04/05/18 (!) 148/75  03/01/18 133/80    Dyslipidemia The 10-year ASCVD risk score Mikey Bussing DC Jr., et al., 2013) is: 40.4%   Values used to calculate the score:     Age: 56 years     Sex: Male     Is Non-Hispanic African American: No     Diabetic: Yes     Tobacco smoker: Yes     Systolic Blood Pressure: 527 mmHg     Is BP treated: Yes     HDL Cholesterol: 26 mg/dL     Total Cholesterol: 253 mg/dL   Start on lipitor 40 mg with hx DM and high ascvd risk score Normal LFT's Follow TSH Follow fasting lipid panel Discussed common side  effects   Past Medical History:  Diagnosis Date  . Back pain   . Diabetes mellitus without complication ([place])   . Hypertension   . Neuropathy     Social History   Tobacco Use  . Smoking status: Current Every Day Smoker    Packs/day: 1.25    Types: Cigarettes  . Smokeless tobacco: Never Used  Substance Use Topics  . Alcohol use: No     Current Outpatient Medications:  .  Dulaglutide (TRULICITY) 9.47 ML/4.6TK SOPN, Inject 0.75 mg into the skin once Kourtnei Rauber week., Disp: 12 pen, Rfl: 1 .  gabapentin (NEURONTIN) 300 MG capsule, TAKE 2 CAPSULES BY MOUTH EVERY MORNING, 3 CAPSULES MID DAY, AND 3 CAPSULES EVERY EVENING, Disp: 240 capsule, Rfl: 0 .  glimepiride (AMARYL) 4 MG tablet, Take 1 tablet (4 mg total) by mouth daily with breakfast., Disp: 90 tablet, Rfl: 1 .  hydrochlorothiazide (MICROZIDE) 12.5 MG capsule, TAKE  ONE TABLET BY MOUTH EVERY DAY, Disp: 90 capsule, Rfl: 0 .  ibuprofen (ADVIL,MOTRIN) 200 MG tablet, Take 200 mg by mouth every 6 (six) hours as needed., Disp: , Rfl:  .  lisinopril (PRINIVIL,ZESTRIL) 20 MG tablet, TAKE ONE TABLET BY MOUTH EVERY DAY, Disp: 90 tablet, Rfl: 0 .  metFORMIN (GLUCOPHAGE) 500 MG tablet, Take 2 tablets (1,000 mg total) by mouth 2 (two) times daily., Disp: 360 tablet, Rfl: 1 .  methadone (DOLOPHINE) 10 MG/5ML solution, Take by mouth., Disp: , Rfl:  .  atorvastatin (LIPITOR) 40 MG tablet, Take 1 tablet (40 mg total) by mouth daily., Disp: 30 tablet, Rfl: 0 .  famotidine (PEPCID) 20 MG tablet, Take 1 tablet (20 mg total) by mouth 2 (two) times daily., Disp: 60 tablet, Rfl: 0 .  methadone (DOLOPHINE) 10 MG tablet, Take by mouth., Disp: , Rfl:  .  methadone (DOLOPHINE) 10 MG tablet, Take by mouth., Disp: , Rfl:   No Known Allergies  Review of Systems  Respiratory: Negative for shortness of breath.   Cardiovascular: Negative for chest pain.  Musculoskeletal: Positive for back pain.   Objective  Vitals:   07/05/18 1838  BP: 139/72  Pulse: 95  Temp: 98.3 F (36.8 C)  TempSrc: Oral  Weight: (!) 364 lb 8 oz (165.3 kg)     Physical Exam  Constitutional: He is oriented to person, place, and time. He appears well-developed and well-nourished.  Neck: Normal range of motion.  Cardiovascular: Normal rate and regular rhythm.  Distant due to body habitus  Pulmonary/Chest: Effort normal and breath sounds normal. No stridor. No respiratory distress. He has no wheezes.  Abdominal: Soft. He exhibits no distension. There is no tenderness.  protuberant  Musculoskeletal: Normal range of motion.  Neurological: He is alert and oriented to person, place, and time.  Skin: Skin is warm and dry.  Psychiatric: He has Dacy Enrico normal mood and affect.  MSK: No midline ttp.  No paraspinal TTP.  + SLR on L.   Neuro: 1+ reflex to bilateral patella.  5/5 strength to lower extremity.  Normal  gait.  Able to walk on heels and toes.  Recent Results (from the past 2160 hour(s))  Direct LDL     Status: Abnormal   Collection Time: 06/28/18  7:24 PM  Result Value Ref Range   LDL Direct 140 (H) 0 - 99 mg/dL  Comp Met (CMET)     Status: Abnormal   Collection Time: 06/28/18  7:24 PM  Result Value Ref Range   Glucose 219 (H) 65 - 99  mg/dL   BUN 19 6 - 24 mg/dL   Creatinine, Ser 0.84 0.76 - 1.27 mg/dL   GFR calc non Af Amer 105 >59 mL/min/1.73   GFR calc Af Amer 121 >59 mL/min/1.73   BUN/Creatinine Ratio 23 (H) 9 - 20   Sodium 137 134 - 144 mmol/L   Potassium 5.4 (H) 3.5 - 5.2 mmol/L   Chloride 97 96 - 106 mmol/L   CO2 24 20 - 29 mmol/L   Calcium 9.9 8.7 - 10.2 mg/dL   Total Protein 7.1 6.0 - 8.5 g/dL   Albumin 4.1 3.5 - 5.5 g/dL   Globulin, Total 3.0 1.5 - 4.5 g/dL   Albumin/Globulin Ratio 1.4 1.2 - 2.2   Bilirubin Total <0.2 0.0 - 1.2 mg/dL   Alkaline Phosphatase 78 39 - 117 IU/L   AST 11 0 - 40 IU/L   ALT 18 0 - 44 IU/L  HgB A1c     Status: Abnormal   Collection Time: 06/28/18  7:24 PM  Result Value Ref Range   Hgb A1c MFr Bld 9.3 (H) 4.8 - 5.6 %    Comment:          Prediabetes: 5.7 - 6.4          Diabetes: >6.4          Glycemic control for adults with diabetes: <7.0    Est. average glucose Bld gHb Est-mCnc 220 mg/dL     Assessment & Plan  Problem List Items Addressed This Visit      Cardiovascular and Mediastinum   Hypertension - Primary    BP 139/72, above goal Continue to monitor at this point, consider increasing HCTZ or lisinopril at next visit for goal <130/80  Repeat BMP given mildly elevated K   BP Readings from Last 3 Encounters:  07/05/18 139/72  04/05/18 (!) 148/75  03/01/18 133/80        Relevant Medications   atorvastatin (LIPITOR) 40 MG tablet   Other Relevant Orders   Basic Metabolic Panel (BMET)   TSH     Digestive   GERD (gastroesophageal reflux disease)    Transition to pepcid from ranitidine      Relevant Medications    famotidine (PEPCID) 20 MG tablet     Endocrine   Type 2 diabetes mellitus with diabetic neuropathy, without long-term current use of insulin (HCC)    A1c still above goal, but he's reporting improved BG's in the low 100's Doesn't correlate well with his A1c He's no longer taking jardiance due to concern abt side effects Discussed follow up in 1 month, bring glucometer, continue to follow BG's He'll need adjustment at next visit  Lab Results  Component Value Date   HGBA1C 9.3 (H) 06/28/2018    He's due for an eye exam and foot exam         Relevant Medications   atorvastatin (LIPITOR) 40 MG tablet     Other   Chronic back pain    He notes L sided back pain which seems to be chronic No pain today, but notes pain with activity He's going to see PT soon and is planning to discuss this with them Continue to monitor for now      Dyslipidemia    The 10-year ASCVD risk score Mikey Bussing DC Jr., et al., 2013) is: 40.4%   Values used to calculate the score:     Age: 62 years     Sex: Male     Is Non-Hispanic African American:  No     Diabetic: Yes     Tobacco smoker: Yes     Systolic Blood Pressure: 779 mmHg     Is BP treated: Yes     HDL Cholesterol: 26 mg/dL     Total Cholesterol: 253 mg/dL   Start on lipitor 40 mg with hx DM and high ascvd risk score Normal LFT's Follow TSH Follow fasting lipid panel Discussed common side effects      Relevant Medications   atorvastatin (LIPITOR) 40 MG tablet   Other Relevant Orders   Lipid Profile

## 2018-07-05 NOTE — Assessment & Plan Note (Addendum)
He notes L sided back pain which seems to be chronic No pain today, but notes pain with activity He's going to see PT soon and is planning to discuss this with them Continue to monitor for now

## 2018-07-05 NOTE — Patient Instructions (Addendum)
We're starting you on lipitor for your diabetes and cholesterol.    [facility].  You should have fasting lipids at some point in the future (come on Wednesday morning).  You're due for eye exam and foot exam, please follow up with this at your next visit.  Please bring your glucometer to your next visit and keep track of your blood sugars.   Stop you ranitidine.  Start pepcid instead.

## 2018-07-06 ENCOUNTER — Other Ambulatory Visit: Payer: Self-pay | Admitting: Internal Medicine

## 2018-07-06 DIAGNOSIS — E785 Hyperlipidemia, unspecified: Secondary | ICD-10-CM | POA: Insufficient documentation

## 2018-07-06 LAB — BASIC METABOLIC PANEL
BUN/Creatinine Ratio: 19 (ref 9–20)
BUN: 15 mg/dL (ref 6–24)
CO2: 21 mmol/L (ref 20–29)
Calcium: 9.6 mg/dL (ref 8.7–10.2)
Chloride: 95 mmol/L — ABNORMAL LOW (ref 96–106)
Creatinine, Ser: 0.8 mg/dL (ref 0.76–1.27)
GFR, EST AFRICAN AMERICAN: 124 mL/min/{1.73_m2} (ref >59)
GFR, EST NON AFRICAN AMERICAN: 107 mL/min/{1.73_m2} (ref >59)
Glucose: 181 mg/dL — ABNORMAL HIGH (ref 65–99)
POTASSIUM: 4.2 mmol/L (ref 3.5–5.2)
SODIUM: 136 mmol/L (ref 134–144)

## 2018-07-06 LAB — TSH: TSH: 2.97 u[IU]/mL (ref 0.450–4.500)

## 2018-07-06 NOTE — Assessment & Plan Note (Addendum)
The 10-year ASCVD risk score Denman George DC Montez Hageman., et al., 2013) is: 40.4%   Values used to calculate the score:     Age: 47 years     Sex: Male     Is Non-Hispanic African American: No     Diabetic: Yes     Tobacco smoker: Yes     Systolic Blood Pressure: 139 mmHg     Is BP treated: Yes     HDL Cholesterol: 26 mg/dL     Total Cholesterol: 253 mg/dL   Start on lipitor 40 mg with hx DM and high ascvd risk score Normal LFT's Follow TSH Follow fasting lipid panel Discussed common side effects

## 2018-07-06 NOTE — Assessment & Plan Note (Addendum)
BP 139/72, above goal Continue to monitor at this point, consider increasing HCTZ or lisinopril at next visit for goal <130/80  Repeat BMP given mildly elevated K   BP Readings from Last 3 Encounters:  07/05/18 139/72  04/05/18 (!) 148/75  03/01/18 133/80

## 2018-07-23 ENCOUNTER — Other Ambulatory Visit: Payer: Self-pay | Admitting: Internal Medicine

## 2018-07-25 ENCOUNTER — Other Ambulatory Visit: Payer: Medicaid Other

## 2018-07-26 ENCOUNTER — Other Ambulatory Visit: Payer: Self-pay

## 2018-07-26 MED ORDER — GABAPENTIN 300 MG PO CAPS: ORAL_CAPSULE | ORAL | 0 refills | Status: DC

## 2018-08-01 ENCOUNTER — Other Ambulatory Visit: Payer: Medicaid Other

## 2018-08-02 ENCOUNTER — Ambulatory Visit: Payer: Medicaid Other

## 2018-08-09 ENCOUNTER — Telehealth: Payer: Self-pay | Admitting: Pharmacist

## 2018-08-09 NOTE — Telephone Encounter (Signed)
08/09/2018 1:57:23 PM - Trulicity/[facility] renewal  08/09/18 Mailing patient [facility] renewal application to sign & return, also taking provider portion to [facility] for Trulicity Inject 0.75mg  once a week.Luis [name]

## 2018-08-23 ENCOUNTER — Other Ambulatory Visit: Payer: Self-pay | Admitting: Internal Medicine

## 2018-08-23 DIAGNOSIS — E119 Type 2 diabetes mellitus without complications: Secondary | ICD-10-CM

## 2018-09-20 ENCOUNTER — Ambulatory Visit: Payer: Medicaid Other | Admitting: Urology

## 2018-09-20 DIAGNOSIS — E1140 Type 2 diabetes mellitus with diabetic neuropathy, unspecified: Secondary | ICD-10-CM

## 2018-09-20 DIAGNOSIS — E785 Hyperlipidemia, unspecified: Secondary | ICD-10-CM

## 2018-09-20 MED ORDER — ATORVASTATIN CALCIUM 40 MG PO TABS: 40.0000 mg | ORAL_TABLET | Freq: Every day | ORAL | 0 refills | Status: DC

## 2018-09-20 MED ORDER — GABAPENTIN 300 MG PO CAPS: ORAL_CAPSULE | ORAL | 0 refills | Status: DC

## 2018-09-20 NOTE — Progress Notes (Signed)
Patient's lab orders are in and meds (Lipitor and Gabapentin) refilled.

## 2018-09-26 ENCOUNTER — Other Ambulatory Visit: Payer: Medicaid Other

## 2018-09-26 DIAGNOSIS — E1140 Type 2 diabetes mellitus with diabetic neuropathy, unspecified: Secondary | ICD-10-CM

## 2018-09-26 DIAGNOSIS — E785 Hyperlipidemia, unspecified: Secondary | ICD-10-CM

## 2018-09-27 LAB — CBC WITH DIFFERENTIAL/PLATELET
BASOS ABS: 0.1 10*3/uL (ref 0.0–0.2)
Basos: 1 %
EOS (ABSOLUTE): 0.1 10*3/uL (ref 0.0–0.4)
Eos: 1 %
Hematocrit: 42.5 % (ref 37.5–51.0)
Hemoglobin: 14.1 g/dL (ref 13.0–17.7)
Immature Grans (Abs): 0.1 10*3/uL (ref 0.0–0.1)
Immature Granulocytes: 1 %
Lymphocytes Absolute: 5.5 10*3/uL — ABNORMAL HIGH (ref 0.7–3.1)
Lymphs: 36 %
MCH: 27.4 pg (ref 26.6–33.0)
MCHC: 33.2 g/dL (ref 31.5–35.7)
MCV: 83 fL (ref 79–97)
Monocytes Absolute: 1 10*3/uL — ABNORMAL HIGH (ref 0.1–0.9)
Monocytes: 7 %
NEUTROS ABS: 8.5 10*3/uL — AB (ref 1.4–7.0)
Neutrophils: 54 %
PLATELETS: 364 10*3/uL (ref 150–450)
RBC: 5.14 x10E6/uL (ref 4.14–5.80)
RDW: 13.1 % (ref 11.6–15.4)
WBC: 15.3 10*3/uL — ABNORMAL HIGH (ref 3.4–10.8)

## 2018-09-27 LAB — COMPREHENSIVE METABOLIC PANEL
ALT: 25 IU/L (ref 0–44)
AST: 14 IU/L (ref 0–40)
Albumin/Globulin Ratio: 1.4 (ref 1.2–2.2)
Albumin: 4 g/dL (ref 4.0–5.0)
Alkaline Phosphatase: 87 IU/L (ref 39–117)
BUN/Creatinine Ratio: 12 (ref 9–20)
BUN: 9 mg/dL (ref 6–24)
CO2: 26 mmol/L (ref 20–29)
Calcium: 9.7 mg/dL (ref 8.7–10.2)
Chloride: 92 mmol/L — ABNORMAL LOW (ref 96–106)
Creatinine, Ser: 0.74 mg/dL — ABNORMAL LOW (ref 0.76–1.27)
GFR calc Af Amer: 128 mL/min/{1.73_m2} (ref >59)
GFR calc non Af Amer: 111 mL/min/{1.73_m2} (ref >59)
GLUCOSE: 202 mg/dL — AB (ref 65–99)
Globulin, Total: 2.9 g/dL (ref 1.5–4.5)
Potassium: 4.8 mmol/L (ref 3.5–5.2)
Sodium: 133 mmol/L — ABNORMAL LOW (ref 134–144)
Total Protein: 6.9 g/dL (ref 6.0–8.5)

## 2018-09-27 LAB — LIPID PANEL
CHOLESTEROL TOTAL: 191 mg/dL (ref 100–199)
Chol/HDL Ratio: 6.6 ratio — ABNORMAL HIGH (ref 0.0–5.0)
HDL: 29 mg/dL — ABNORMAL LOW (ref >39)
LDL Calculated: 115 mg/dL — ABNORMAL HIGH (ref 0–99)
Triglycerides: 237 mg/dL — ABNORMAL HIGH (ref 0–149)
VLDL Cholesterol Cal: 47 mg/dL — ABNORMAL HIGH (ref 5–40)

## 2018-09-27 LAB — HEMOGLOBIN A1C
Est. average glucose Bld gHb Est-mCnc: 260 mg/dL
Hgb A1c MFr Bld: 10.7 % — ABNORMAL HIGH (ref 4.8–5.6)

## 2018-09-27 LAB — TSH: TSH: 4.32 u[IU]/mL (ref 0.450–4.500)

## 2018-10-04 ENCOUNTER — Ambulatory Visit: Payer: Medicaid Other

## 2018-10-11 ENCOUNTER — Ambulatory Visit: Payer: Medicaid Other

## 2018-10-18 ENCOUNTER — Ambulatory Visit: Payer: Medicaid Other | Admitting: Adult Health Nurse Practitioner

## 2018-10-18 VITALS — BP 123/73 | HR 97 | Temp 98.2°F | Ht 67.75 in | Wt 351.7 lb

## 2018-10-18 DIAGNOSIS — E119 Type 2 diabetes mellitus without complications: Secondary | ICD-10-CM

## 2018-10-18 DIAGNOSIS — Z6841 Body Mass Index (BMI) 40.0 and over, adult: Secondary | ICD-10-CM

## 2018-10-18 DIAGNOSIS — I10 Essential (primary) hypertension: Secondary | ICD-10-CM

## 2018-10-18 DIAGNOSIS — E785 Hyperlipidemia, unspecified: Secondary | ICD-10-CM

## 2018-10-18 MED ORDER — DULAGLUTIDE 1.5 MG/0.5ML ~~LOC~~ SOAJ: 1.5000 mg | SUBCUTANEOUS | 6 refills | Status: DC

## 2018-10-18 MED ORDER — GABAPENTIN 300 MG PO CAPS: ORAL_CAPSULE | ORAL | 2 refills | Status: DC

## 2018-10-18 MED ORDER — ATORVASTATIN CALCIUM 40 MG PO TABS: 40.0000 mg | ORAL_TABLET | Freq: Every day | ORAL | 6 refills | Status: DC

## 2018-10-18 NOTE — Progress Notes (Signed)
Patient: Luis [name] Male    DOB: 06-05-1971   48 y.o.   MRN: [id number] Visit Date: 10/18/2018  Today's Provider: [facility]   Chief Complaint  Patient presents with  . Follow-up    has been experiencing pain in the abdomen; ran out of Gabapentin this month   Subjective:    HPI   A1c is up to 10.7 from 9.3.  Pt states that he does not want to get on insulin at this time.  Wants to diet and exercise. Down 13 lbs from Nov.   Taking medications like he should.     No Known Allergies Previous Medications   ATORVASTATIN (LIPITOR) 40 MG TABLET    Take 1 tablet (40 mg total) by mouth daily for 30 days.   DULAGLUTIDE (TRULICITY) 0.75 MG/0.5ML SOPN    Inject 0.75 mg into the skin once a week.   FAMOTIDINE (PEPCID) 20 MG TABLET    Take 1 tablet (20 mg total) by mouth 2 (two) times daily.   GABAPENTIN (NEURONTIN) 300 MG CAPSULE    Two tablets, three tablets in the afternoon and three tablets in the evening   GLIMEPIRIDE (AMARYL) 4 MG TABLET    TAKE ONE TABLET BY MOUTH EVERY DAY WITH BREAKFAST   HYDROCHLOROTHIAZIDE (MICROZIDE) 12.5 MG CAPSULE    TAKE ONE CAPSULE BY MOUTH EVERY DAY   IBUPROFEN (ADVIL,MOTRIN) 200 MG TABLET    Take 200 mg by mouth every 6 (six) hours as needed.   LISINOPRIL (PRINIVIL,ZESTRIL) 20 MG TABLET    TAKE ONE TABLET BY MOUTH EVERY DAY   METFORMIN (GLUCOPHAGE) 500 MG TABLET    Take 2 tablets (1,000 mg total) by mouth 2 (two) times daily.   METHADONE (DOLOPHINE) 10 MG TABLET    Take by mouth.   METHADONE (DOLOPHINE) 10 MG TABLET    Take by mouth.   METHADONE (DOLOPHINE) 10 MG/5ML SOLUTION    Take by mouth.    Review of Systems  All other systems reviewed and are negative.   Social History   Tobacco Use  . Smoking status: Current Every Day Smoker    Packs/day: 1.25    Types: Cigarettes  . Smokeless tobacco: Never Used  Substance Use Topics  . Alcohol use: No   Objective:   BP 123/73 (BP Location: Left Arm, Patient Position: Sitting, Cuff  Size: Normal)   Pulse 97   Temp 98.2 F (36.8 C) (Oral)   Ht 5' 7.75" (1.721 m)   Wt (!) 351 lb 11.2 oz (159.5 kg)   BMI 53.87 kg/m   Physical Exam Vitals signs reviewed.  Constitutional:      Appearance: Normal appearance.  Neck:     Musculoskeletal: Normal range of motion and neck supple.  Cardiovascular:     Rate and Rhythm: Normal rate and regular rhythm.  Pulmonary:     Effort: Pulmonary effort is normal.     Breath sounds: Decreased breath sounds present.  Abdominal:     General: Bowel sounds are normal.     Palpations: Abdomen is soft.  Skin:    General: Skin is warm and dry.  Neurological:     Mental Status: He is alert.         Assessment & Plan:         DM:  Not controlled.  Encourage diabetic diet and exercise.  Continue Metformin and Amaryl. Increase Trulicity to 1.5mg  weekly.  Revisit insulin therapy if A1c still elevated at next OV.   HLD:  LDL still elevated at 115.   Continue current regimen.  Encourage low cholesterol, low fat diet and exercise.   HTN:  Controlled.  Goal BP <140/80.  Continue current medication regimen.  Encourage low salt diet and exercise.        [facility]   [facility]

## 2018-10-22 ENCOUNTER — Telehealth: Payer: Self-pay | Admitting: Pharmacist

## 2018-10-22 ENCOUNTER — Other Ambulatory Visit: Payer: Self-pay | Admitting: Urology

## 2018-10-22 NOTE — Telephone Encounter (Signed)
10/22/2018 11:49:19 AM - Trulicity Dose Increase  10/22/2018 Reviewing patient paperwork on Trulicity/[facility] application that was mailed to patient Dec. 19,2019 to sign and return for Trulicity 0.75mg  once a week and patient has not return. Now Dose increase to 1.5mg  once a week (4 boxes=16 pens) mailing patient paperwork to sign & return with income/support & taxes/[id number] to also return to order this medication, sending forms to provider @ [facility] to sign for dose increase.Forde Radon

## 2018-10-23 ENCOUNTER — Other Ambulatory Visit: Payer: Self-pay

## 2018-10-23 MED ORDER — GABAPENTIN 300 MG PO CAPS: ORAL_CAPSULE | ORAL | 2 refills | Status: DC

## 2018-11-21 ENCOUNTER — Other Ambulatory Visit: Payer: Self-pay | Admitting: Internal Medicine

## 2018-11-21 DIAGNOSIS — E119 Type 2 diabetes mellitus without complications: Secondary | ICD-10-CM

## 2019-01-10 ENCOUNTER — Other Ambulatory Visit: Payer: Self-pay

## 2019-01-10 ENCOUNTER — Other Ambulatory Visit: Payer: Medicaid Other

## 2019-01-10 DIAGNOSIS — E119 Type 2 diabetes mellitus without complications: Secondary | ICD-10-CM

## 2019-01-11 LAB — COMPREHENSIVE METABOLIC PANEL
ALT: 20 IU/L (ref 0–44)
AST: 9 IU/L (ref 0–40)
Albumin/Globulin Ratio: 1.4 (ref 1.2–2.2)
Albumin: 3.9 g/dL — ABNORMAL LOW (ref 4.0–5.0)
Alkaline Phosphatase: 87 IU/L (ref 39–117)
BUN/Creatinine Ratio: 18 (ref 9–20)
BUN: 14 mg/dL (ref 6–24)
Bilirubin Total: <0.2 mg/dL (ref 0.0–1.2)
CO2: 24 mmol/L (ref 20–29)
Calcium: 9.4 mg/dL (ref 8.7–10.2)
Chloride: 95 mmol/L — ABNORMAL LOW (ref 96–106)
Creatinine, Ser: 0.8 mg/dL (ref 0.76–1.27)
GFR calc Af Amer: 124 mL/min/{1.73_m2} (ref >59)
GFR calc non Af Amer: 107 mL/min/{1.73_m2} (ref >59)
Globulin, Total: 2.8 g/dL (ref 1.5–4.5)
Glucose: 319 mg/dL — ABNORMAL HIGH (ref 65–99)
Potassium: 4.4 mmol/L (ref 3.5–5.2)
Sodium: 136 mmol/L (ref 134–144)
Total Protein: 6.7 g/dL (ref 6.0–8.5)

## 2019-01-11 LAB — HEMOGLOBIN A1C
Est. average glucose Bld gHb Est-mCnc: 269 mg/dL
Hgb A1c MFr Bld: 11 % — ABNORMAL HIGH (ref 4.8–5.6)

## 2019-01-17 ENCOUNTER — Other Ambulatory Visit: Payer: Self-pay | Admitting: Internal Medicine

## 2019-01-17 ENCOUNTER — Ambulatory Visit: Payer: Medicaid Other

## 2019-01-21 ENCOUNTER — Other Ambulatory Visit: Payer: Self-pay | Admitting: Adult Health Nurse Practitioner

## 2019-01-24 ENCOUNTER — Other Ambulatory Visit: Payer: Self-pay

## 2019-01-24 ENCOUNTER — Ambulatory Visit: Payer: Medicaid Other | Admitting: Adult Health Nurse Practitioner

## 2019-01-24 DIAGNOSIS — E118 Type 2 diabetes mellitus with unspecified complications: Secondary | ICD-10-CM

## 2019-01-24 DIAGNOSIS — Z794 Long term (current) use of insulin: Secondary | ICD-10-CM

## 2019-01-24 MED ORDER — INSULIN GLARGINE 100 UNIT/ML ~~LOC~~ SOLN: 10.0000 [IU] | Freq: Every day | SUBCUTANEOUS | 11 refills | Status: DC

## 2019-01-24 NOTE — Progress Notes (Signed)
  Patient: Luis [name] Male    DOB: 01-09-1971   48 y.o.   MRN: [id number] Visit Date: 01/24/2019  Today's Provider: Jacelyn Pi, NP   No chief complaint on file.  Subjective:    HPI  Telephonic visit.  Labs show that a1c has gone up from 10.7 to 11. Pt reports that he has not received Trulicity at increased dose of 1.5 that was ordered during last visit. Pt is hesitant to start insulin. Pt states he is willing to start insulin and watch diet. Pt requests to stop Trulicity. Pt reports he has a glucose monitor at home but has not been checking his CBGs  regularly.     No Known Allergies Previous Medications   ATORVASTATIN (LIPITOR) 40 MG TABLET    Take 1 tablet (40 mg total) by mouth daily for 30 days.   DULAGLUTIDE (TRULICITY) 1.5 MG/0.5ML SOPN    Inject 1.5 mg into the skin once a week.   FAMOTIDINE (PEPCID) 20 MG TABLET    Take 1 tablet (20 mg total) by mouth 2 (two) times daily.   GABAPENTIN (NEURONTIN) 300 MG CAPSULE    TAKE 2 CAPSULES BY MOUTH EVERY MORNING, 3 CAPSULES IN THE AFTERNOON AND 3 CAPSULES EVERY EVENING   GLIMEPIRIDE (AMARYL) 4 MG TABLET    TAKE ONE TABLET BY MOUTH EVERY DAY WITH BREAKFAST   HYDROCHLOROTHIAZIDE (MICROZIDE) 12.5 MG CAPSULE    TAKE ONE CAPSULE BY MOUTH EVERY DAY   IBUPROFEN (ADVIL,MOTRIN) 200 MG TABLET    Take 200 mg by mouth every 6 (six) hours as needed.   LISINOPRIL (ZESTRIL) 20 MG TABLET    TAKE ONE TABLET BY MOUTH EVERY DAY   METFORMIN (GLUCOPHAGE) 500 MG TABLET    Take 2 tablets (1,000 mg total) by mouth 2 (two) times daily.   METHADONE (DOLOPHINE) 10 MG/5ML SOLUTION    Take by mouth.    Review of Systems  All other systems reviewed and are negative.   Social History   Tobacco Use  . Smoking status: Current Every Day Smoker    Packs/day: 1.25    Types: Cigarettes  . Smokeless tobacco: Never Used  Substance Use Topics  . Alcohol use: No   Objective:   There were no vitals taken for this visit.  Physical Exam  No PE     Assessment & Plan:     Pt will begin lantus (10 units) every night. Instructed to check sugars every morning before breakfast and keep a record to bring at FU.   FU to see responsiveness to new medication.       Jacelyn Pi, NP   [facility]

## 2019-01-28 ENCOUNTER — Telehealth: Payer: Self-pay | Admitting: Pharmacist

## 2019-01-28 NOTE — Telephone Encounter (Signed)
01/28/2019 11:42:00 AM - Lantus Vials New Med  01/28/2019 I have received a pharmacy printout for New Med-Lantus Vials Inject 10 units under the skin daily at bedtime #1. Printed Scientist, research (physical sciences)- mailing patient his portion to sign & return, also need patient income/supprt & [id number], & Recertification packet, when this information is received then I will take provider portion to [facility] for Teah to sign.Luis [name]

## 2019-01-31 ENCOUNTER — Ambulatory Visit: Payer: Medicaid Other | Admitting: Adult Health Nurse Practitioner

## 2019-01-31 ENCOUNTER — Other Ambulatory Visit: Payer: Self-pay

## 2019-01-31 DIAGNOSIS — E118 Type 2 diabetes mellitus with unspecified complications: Secondary | ICD-10-CM

## 2019-01-31 DIAGNOSIS — E119 Type 2 diabetes mellitus without complications: Secondary | ICD-10-CM

## 2019-01-31 DIAGNOSIS — Z794 Long term (current) use of insulin: Secondary | ICD-10-CM

## 2019-01-31 MED ORDER — METFORMIN HCL 500 MG PO TABS: 1000.0000 mg | ORAL_TABLET | Freq: Two times a day (BID) | ORAL | 1 refills | Status: DC

## 2019-01-31 NOTE — Progress Notes (Addendum)
  Patient: Luis [name] Male    DOB: Nov 17, 1970   48 y.o.   MRN: [id number] Visit Date: 01/31/2019  Today's Provider: Staci Acosta, NP   No chief complaint on file.  Subjective:    HPI   Telephonic visit.   Last visit started on Lantus due to A1c of 11. Stated he has only taken the insulin one night.  CBGs are running high- fasting it was 271.     No Known Allergies Previous Medications   ATORVASTATIN (LIPITOR) 40 MG TABLET    Take 1 tablet (40 mg total) by mouth daily for 30 days.   FAMOTIDINE (PEPCID) 20 MG TABLET    Take 1 tablet (20 mg total) by mouth 2 (two) times daily.   GABAPENTIN (NEURONTIN) 300 MG CAPSULE    TAKE 2 CAPSULES BY MOUTH EVERY MORNING, 3 CAPSULES IN THE AFTERNOON AND 3 CAPSULES EVERY EVENING   GLIMEPIRIDE (AMARYL) 4 MG TABLET    TAKE ONE TABLET BY MOUTH EVERY DAY WITH BREAKFAST   HYDROCHLOROTHIAZIDE (MICROZIDE) 12.5 MG CAPSULE    TAKE ONE CAPSULE BY MOUTH EVERY DAY   IBUPROFEN (ADVIL,MOTRIN) 200 MG TABLET    Take 200 mg by mouth every 6 (six) hours as needed.   INSULIN GLARGINE (LANTUS) 100 UNIT/ML INJECTION    Inject 0.1 mLs (10 Units total) into the skin at bedtime.   LISINOPRIL (ZESTRIL) 20 MG TABLET    TAKE ONE TABLET BY MOUTH EVERY DAY   METFORMIN (GLUCOPHAGE) 500 MG TABLET    Take 2 tablets (1,000 mg total) by mouth 2 (two) times daily.   METHADONE (DOLOPHINE) 10 MG/5ML SOLUTION    Take by mouth.    Review of Systems  All other systems reviewed and are negative.   Social History   Tobacco Use  . Smoking status: Current Every Day Smoker    Packs/day: 1.25    Types: Cigarettes  . Smokeless tobacco: Never Used  Substance Use Topics  . Alcohol use: No   Objective:   There were no vitals taken for this visit.  Physical Exam  No PE.      Assessment & Plan:        Take  10 units tonight if CBG is >200 in the am fasting then increase to 15 units daily.  Continue to check CBGs fasting daily and keep log.   Keep appointment in July.     Staci Acosta, NP   [facility]

## 2019-02-21 ENCOUNTER — Ambulatory Visit: Payer: Medicaid Other | Admitting: Urology

## 2019-02-21 ENCOUNTER — Other Ambulatory Visit: Payer: Self-pay

## 2019-02-21 VITALS — BP 155/88 | HR 96 | Temp 98.6°F | Ht 70.0 in | Wt 353.1 lb

## 2019-02-21 DIAGNOSIS — E119 Type 2 diabetes mellitus without complications: Secondary | ICD-10-CM

## 2019-02-21 MED ORDER — INSULIN GLARGINE 100 UNIT/ML ~~LOC~~ SOLN: 17.0000 [IU] | Freq: Every day | SUBCUTANEOUS | 11 refills | Status: DC

## 2019-02-21 NOTE — Progress Notes (Signed)
  Patient: Luis [name] Male    DOB: [birth year]   48 y.o.   MRN: [id number] Visit Date: 02/21/2019  Today's Provider: Zara Council, PA-C   Chief Complaint  Patient presents with  . Follow-up    176-210 fasting glucose; follow up since starting insulin about 3 weeks ago. still takking all meds on list   Subjective:    HPI  Diabetic meds:  Lantus 15 U qhs, glimepiride 4 mg and metformin 1000 mg BID   Eats veggies from the garden and chicken (grilled/baked)   Trying to walk more   May have had a hypoglycemic episode last Wednesday night/Thursday night - did not check sugars and has started to separate the time frame of taking the metformin and the Lantus    No Known Allergies Previous Medications   ATORVASTATIN (LIPITOR) 40 MG TABLET    Take 1 tablet (40 mg total) by mouth daily for 30 days.   FAMOTIDINE (PEPCID) 20 MG TABLET    Take 1 tablet (20 mg total) by mouth 2 (two) times daily.   GABAPENTIN (NEURONTIN) 300 MG CAPSULE    TAKE 2 CAPSULES BY MOUTH EVERY MORNING, 3 CAPSULES IN THE AFTERNOON AND 3 CAPSULES EVERY EVENING   GLIMEPIRIDE (AMARYL) 4 MG TABLET    TAKE ONE TABLET BY MOUTH EVERY DAY WITH BREAKFAST   HYDROCHLOROTHIAZIDE (MICROZIDE) 12.5 MG CAPSULE    TAKE ONE CAPSULE BY MOUTH EVERY DAY   IBUPROFEN (ADVIL,MOTRIN) 200 MG TABLET    Take 200 mg by mouth every 6 (six) hours as needed.   LISINOPRIL (ZESTRIL) 20 MG TABLET    TAKE ONE TABLET BY MOUTH EVERY DAY   METFORMIN (GLUCOPHAGE) 500 MG TABLET    Take 2 tablets (1,000 mg total) by mouth 2 (two) times daily.   METHADONE (DOLOPHINE) 10 MG/5ML SOLUTION    Take by mouth.    Review of Systems  All other systems reviewed and are negative.   Social History   Tobacco Use  . Smoking status: Current Every Day Smoker    Packs/day: 1.25    Types: Cigarettes  . Smokeless tobacco: Never Used  Substance Use Topics  . Alcohol use: No   Objective:   BP (!) 155/88   Pulse 96   Temp 98.6 F (37 C)   Ht 5\' 10"  (1.778 m)   Wt  (!) 353 lb 1.6 oz (160.2 kg)   SpO2 95%   BMI 50.66 kg/m   Physical Exam  No PE.    Assessment & Plan:    Take 17 units tonight, if CBG < 100 decrease back to 15 units  Continue to check CBGs fasting daily and keep log.  RTC in 3 weeks    Zara Council, PA-C   [facility]

## 2019-03-14 ENCOUNTER — Ambulatory Visit: Payer: Medicaid Other

## 2019-03-14 ENCOUNTER — Other Ambulatory Visit: Payer: Self-pay

## 2019-03-22 ENCOUNTER — Other Ambulatory Visit: Payer: Self-pay | Admitting: Internal Medicine

## 2019-04-09 ENCOUNTER — Telehealth: Payer: Self-pay | Admitting: Pharmacy Technician

## 2019-04-09 NOTE — Telephone Encounter (Signed)
Received 2020 proof of income.  Patient eligible to receive medication assistance at [facility] as long as eligibility requirements continue to be met.  [facility]

## 2019-05-02 ENCOUNTER — Other Ambulatory Visit: Payer: Self-pay | Admitting: Internal Medicine

## 2019-05-02 DIAGNOSIS — E119 Type 2 diabetes mellitus without complications: Secondary | ICD-10-CM

## 2019-05-06 ENCOUNTER — Other Ambulatory Visit: Payer: Disability Insurance | Admitting: Pharmacist

## 2019-05-09 ENCOUNTER — Other Ambulatory Visit: Payer: Self-pay

## 2019-05-09 ENCOUNTER — Ambulatory Visit: Payer: Medicaid Other | Admitting: Urology

## 2019-05-09 VITALS — BP 147/85 | HR 95 | Temp 97.7°F | Ht 70.0 in | Wt 354.5 lb

## 2019-05-09 DIAGNOSIS — I10 Essential (primary) hypertension: Secondary | ICD-10-CM

## 2019-05-09 DIAGNOSIS — E119 Type 2 diabetes mellitus without complications: Secondary | ICD-10-CM

## 2019-05-09 NOTE — Progress Notes (Signed)
  Patient: Luis [name] Male    DOB: [birth year]   48 y.o.   MRN: [id number] Visit Date: 05/09/2019  Today's Provider: Zara Council, PA-C   Chief Complaint  Patient presents with  . Follow-up    Talk about insulin prescribed. They upped the dose and now his sugar runs from 120-150's first thing in the morning.   Subjective:    HPI  Injecting 18 units  of Lantus at night -fasting blood sugars in the am are 120-150   Evenings sugars around 90-110   He states he feels good   No Known Allergies Previous Medications   ATORVASTATIN (LIPITOR) 40 MG TABLET    Take 1 tablet (40 mg total) by mouth daily for 30 days.   FAMOTIDINE (PEPCID) 20 MG TABLET    Take 1 tablet (20 mg total) by mouth 2 (two) times daily.   GABAPENTIN (NEURONTIN) 300 MG CAPSULE    TAKE 2 CAPSULES BY MOUTH EVERY MORNING, 3 CAPSULES IN THE AFTERNOON AND 3 CAPSULES EVERY EVENING   GLIMEPIRIDE (AMARYL) 4 MG TABLET    TAKE ONE TABLET BY MOUTH EVERY DAY WITH BREAKFAST   HYDROCHLOROTHIAZIDE (MICROZIDE) 12.5 MG CAPSULE    TAKE ONE CAPSULE BY MOUTH EVERY DAY   IBUPROFEN (ADVIL,MOTRIN) 200 MG TABLET    Take 200 mg by mouth every 6 (six) hours as needed.   INSULIN GLARGINE (LANTUS) 100 UNIT/ML INJECTION    Inject 0.17 mLs (17 Units total) into the skin at bedtime.   LISINOPRIL (ZESTRIL) 20 MG TABLET    TAKE ONE TABLET BY MOUTH EVERY DAY   METFORMIN (GLUCOPHAGE) 500 MG TABLET    Take 2 tablets (1,000 mg total) by mouth 2 (two) times daily.   METHADONE (DOLOPHINE) 10 MG/5ML SOLUTION    Take by mouth.    Review of Systems  Social History   Tobacco Use  . Smoking status: Current Every Day Smoker    Packs/day: 1.00    Years: 30.00    Pack years: 30.00    Types: Cigarettes  . Smokeless tobacco: Never Used  Substance Use Topics  . Alcohol use: Yes    Alcohol/week: 3.0 standard drinks    Types: 3 Cans of beer per week   Objective:   BP (!) 147/85   Pulse 95   Temp 97.7 F (36.5 C)   Ht 5\' 10"  (1.778 m) Comment: as  reported by the patient  Wt (!) 354 lb 8 oz (160.8 kg)   SpO2 93%   BMI 50.87 kg/m   Physical Exam      Assessment & Plan:   1. DM  Better control with increase of Lantus  Check HgbA1c% and routine labs   2. HTN  Slightly elevated tonight       Zara Council, PA-C   [facility]

## 2019-05-10 ENCOUNTER — Telehealth: Payer: Self-pay | Admitting: Urology

## 2019-05-10 ENCOUNTER — Other Ambulatory Visit: Payer: Self-pay | Admitting: Urology

## 2019-05-10 LAB — COMPREHENSIVE METABOLIC PANEL
ALT: 21 IU/L (ref 0–44)
AST: 14 IU/L (ref 0–40)
Albumin/Globulin Ratio: 1.4 (ref 1.2–2.2)
Albumin: 4.1 g/dL (ref 4.0–5.0)
Alkaline Phosphatase: 96 IU/L (ref 39–117)
BUN/Creatinine Ratio: 20 (ref 9–20)
BUN: 17 mg/dL (ref 6–24)
Bilirubin Total: <0.2 mg/dL (ref 0.0–1.2)
CO2: 24 mmol/L (ref 20–29)
Calcium: 10 mg/dL (ref 8.7–10.2)
Chloride: 91 mmol/L — ABNORMAL LOW (ref 96–106)
Creatinine, Ser: 0.87 mg/dL (ref 0.76–1.27)
GFR calc Af Amer: 119 mL/min/{1.73_m2} (ref >59)
GFR calc non Af Amer: 103 mL/min/{1.73_m2} (ref >59)
Globulin, Total: 3 g/dL (ref 1.5–4.5)
Glucose: 342 mg/dL — ABNORMAL HIGH (ref 65–99)
Potassium: 5 mmol/L (ref 3.5–5.2)
Sodium: 133 mmol/L — ABNORMAL LOW (ref 134–144)
Total Protein: 7.1 g/dL (ref 6.0–8.5)

## 2019-05-10 LAB — CBC WITH DIFFERENTIAL/PLATELET
Basophils Absolute: 0.1 10*3/uL (ref 0.0–0.2)
Basos: 1 %
EOS (ABSOLUTE): 0 10*3/uL (ref 0.0–0.4)
Eos: 0 %
Hematocrit: 45.6 % (ref 37.5–51.0)
Hemoglobin: 15 g/dL (ref 13.0–17.7)
Immature Grans (Abs): 0.1 10*3/uL (ref 0.0–0.1)
Immature Granulocytes: 1 %
Lymphocytes Absolute: 5.4 10*3/uL — ABNORMAL HIGH (ref 0.7–3.1)
Lymphs: 36 %
MCH: 27 pg (ref 26.6–33.0)
MCHC: 32.9 g/dL (ref 31.5–35.7)
MCV: 82 fL (ref 79–97)
Monocytes Absolute: 1 10*3/uL — ABNORMAL HIGH (ref 0.1–0.9)
Monocytes: 7 %
Neutrophils Absolute: 8.4 10*3/uL — ABNORMAL HIGH (ref 1.4–7.0)
Neutrophils: 55 %
Platelets: 339 10*3/uL (ref 150–450)
RBC: 5.55 x10E6/uL (ref 4.14–5.80)
RDW: 13.2 % (ref 11.6–15.4)
WBC: 15.1 10*3/uL — ABNORMAL HIGH (ref 3.4–10.8)

## 2019-05-10 LAB — MICROALBUMIN / CREATININE URINE RATIO
Creatinine, Urine: 127.3 mg/dL
Microalb/Creat Ratio: 17 mg/g creat (ref 0–29)
Microalbumin, Urine: 21.6 ug/mL

## 2019-05-10 LAB — HEMOGLOBIN A1C
Est. average glucose Bld gHb Est-mCnc: 275 mg/dL
Hgb A1c MFr Bld: 11.2 % — ABNORMAL HIGH (ref 4.8–5.6)

## 2019-05-10 LAB — TSH: TSH: 2.68 u[IU]/mL (ref 0.450–4.500)

## 2019-05-10 LAB — LIPID PANEL
Chol/HDL Ratio: 8.4 ratio — ABNORMAL HIGH (ref 0.0–5.0)
Cholesterol, Total: 227 mg/dL — ABNORMAL HIGH (ref 100–199)
HDL: 27 mg/dL — ABNORMAL LOW (ref >39)
LDL Chol Calc (NIH): 90 mg/dL (ref 0–99)
Triglycerides: 664 mg/dL (ref 0–149)
VLDL Cholesterol Cal: 110 mg/dL — ABNORMAL HIGH (ref 5–40)

## 2019-05-10 MED ORDER — EZETIMIBE 10 MG PO TABS: 10.0000 mg | ORAL_TABLET | Freq: Every day | ORAL | 1 refills | Status: DC

## 2019-05-10 NOTE — Telephone Encounter (Signed)
I have sent the script in for the Zetia to [facility].

## 2019-05-10 NOTE — Telephone Encounter (Signed)
Spoke with patient regarding [place] recommendations. Patient states he understands what he needs to do and not do.   He does not feel he needs a followup at this time and will discuss further at his Dec appt.  [facility] is able to provide Zetia.

## 2019-05-10 NOTE — Telephone Encounter (Signed)
I have notified the patient to pick up Zetia today. Let us know if he experiences any abnormal side effects.   He said he and his mother are sitting down right now and making a food menu of some healthier choices. Had egg whites and whole wheat toast for breakfast. Planning a large green salad with vegetables for lunch. Grilling chicken and cabbage for dinner. He also stated he has other fruits and vegetables available.   Does he need to stop lisinopril or is he adding Zetia to current med regimen?

## 2019-05-10 NOTE — Telephone Encounter (Signed)
Is he willing to take the Zetia?

## 2019-05-10 NOTE — Telephone Encounter (Signed)
I believe so. I told him we might be sending in a Rx for something new and I would let him know if [facility] could get it for him.

## 2019-05-10 NOTE — Telephone Encounter (Signed)
Please let Luis [name] know that his triglycerides and VLDL levels are elevated and his HDL is low. He has a lifetime risk of cardiovascular disease of 69%. At this time, he needs to make intense dietary changes.  Dietary changes would include avoiding red meat and pork, increasing vegetable intake (5 servings of fresh, steamed or baked daily), avoiding cheeses, avoiding sweets and sugary drinks and alcohol. He also needs to quit smoking.  He is on the maximum dose of Lipitor that is safe.  Increasing the Lipitor to 80 mg is not recommended due to increased incidences of myopathy.  Are we able to get ezetimibe through [facility]?

## 2019-05-10 NOTE — Telephone Encounter (Signed)
That is great news regarding planning their diets!!   The Zetia is being added to his medications, so he needs to continue his other medications as prescribed.

## 2019-05-20 ENCOUNTER — Inpatient Hospital Stay
Admission: EM | Admit: 2019-05-20 | Discharge: 2019-05-22 | DRG: 281 | Disposition: A | Discharge status: Home / Self Care | Payer: Self-pay | Attending: Internal Medicine | Admitting: Internal Medicine

## 2019-05-20 ENCOUNTER — Encounter: Payer: Self-pay | Admitting: Emergency Medicine

## 2019-05-20 ENCOUNTER — Other Ambulatory Visit: Payer: Self-pay

## 2019-05-20 ENCOUNTER — Emergency Department: Payer: Self-pay

## 2019-05-20 ENCOUNTER — Inpatient Hospital Stay
Admit: 2019-05-20 | Discharge: 2019-05-20 | Disposition: A | Discharge status: Home / Self Care | Payer: Self-pay | Attending: Internal Medicine | Admitting: Internal Medicine

## 2019-05-20 DIAGNOSIS — G8929 Other chronic pain: Secondary | ICD-10-CM | POA: Diagnosis present

## 2019-05-20 DIAGNOSIS — Z9111 Patient's noncompliance with dietary regimen: Secondary | ICD-10-CM

## 2019-05-20 DIAGNOSIS — Z79899 Other long term (current) drug therapy: Secondary | ICD-10-CM

## 2019-05-20 DIAGNOSIS — E118 Type 2 diabetes mellitus with unspecified complications: Secondary | ICD-10-CM

## 2019-05-20 DIAGNOSIS — Z8249 Family history of ischemic heart disease and other diseases of the circulatory system: Secondary | ICD-10-CM

## 2019-05-20 DIAGNOSIS — K219 Gastro-esophageal reflux disease without esophagitis: Secondary | ICD-10-CM | POA: Diagnosis present

## 2019-05-20 DIAGNOSIS — I10 Essential (primary) hypertension: Secondary | ICD-10-CM | POA: Diagnosis present

## 2019-05-20 DIAGNOSIS — Z20828 Contact with and (suspected) exposure to other viral communicable diseases: Secondary | ICD-10-CM | POA: Diagnosis present

## 2019-05-20 DIAGNOSIS — I249 Acute ischemic heart disease, unspecified: Secondary | ICD-10-CM

## 2019-05-20 DIAGNOSIS — E119 Type 2 diabetes mellitus without complications: Secondary | ICD-10-CM

## 2019-05-20 DIAGNOSIS — Z79891 Long term (current) use of opiate analgesic: Secondary | ICD-10-CM

## 2019-05-20 DIAGNOSIS — M549 Dorsalgia, unspecified: Secondary | ICD-10-CM | POA: Diagnosis present

## 2019-05-20 DIAGNOSIS — F17210 Nicotine dependence, cigarettes, uncomplicated: Secondary | ICD-10-CM | POA: Diagnosis present

## 2019-05-20 DIAGNOSIS — R079 Chest pain, unspecified: Secondary | ICD-10-CM

## 2019-05-20 DIAGNOSIS — Z833 Family history of diabetes mellitus: Secondary | ICD-10-CM

## 2019-05-20 DIAGNOSIS — E785 Hyperlipidemia, unspecified: Secondary | ICD-10-CM | POA: Diagnosis present

## 2019-05-20 DIAGNOSIS — Z794 Long term (current) use of insulin: Secondary | ICD-10-CM

## 2019-05-20 DIAGNOSIS — I77819 Aortic ectasia, unspecified site: Secondary | ICD-10-CM | POA: Diagnosis present

## 2019-05-20 DIAGNOSIS — E1142 Type 2 diabetes mellitus with diabetic polyneuropathy: Secondary | ICD-10-CM | POA: Diagnosis present

## 2019-05-20 DIAGNOSIS — Z23 Encounter for immunization: Secondary | ICD-10-CM

## 2019-05-20 DIAGNOSIS — I214 Non-ST elevation (NSTEMI) myocardial infarction: Principal | ICD-10-CM | POA: Diagnosis present

## 2019-05-20 DIAGNOSIS — I252 Old myocardial infarction: Secondary | ICD-10-CM | POA: Insufficient documentation

## 2019-05-20 DIAGNOSIS — Z6841 Body Mass Index (BMI) 40.0 and over, adult: Secondary | ICD-10-CM

## 2019-05-20 DIAGNOSIS — G4733 Obstructive sleep apnea (adult) (pediatric): Secondary | ICD-10-CM | POA: Diagnosis present

## 2019-05-20 HISTORY — DX: Acute ischemic heart disease, unspecified: I24.9

## 2019-05-20 HISTORY — DX: Pure hypercholesterolemia, unspecified: E78.00

## 2019-05-20 LAB — BASIC METABOLIC PANEL
Anion gap: 12 (ref 5–15)
BUN: 12 mg/dL (ref 6–20)
CO2: 30 mmol/L (ref 22–32)
Calcium: 9 mg/dL (ref 8.9–10.3)
Chloride: 95 mmol/L — ABNORMAL LOW (ref 98–111)
Creatinine, Ser: 0.65 mg/dL (ref 0.61–1.24)
GFR calc Af Amer: >60 mL/min (ref >60)
GFR calc non Af Amer: >60 mL/min (ref >60)
Glucose, Bld: 310 mg/dL — ABNORMAL HIGH (ref 70–99)
Potassium: 4 mmol/L (ref 3.5–5.1)
Sodium: 137 mmol/L (ref 135–145)

## 2019-05-20 LAB — PROTIME-INR
INR: 0.9 (ref 0.8–1.2)
Prothrombin Time: 12.3 seconds (ref 11.4–15.2)

## 2019-05-20 LAB — CBC WITH DIFFERENTIAL/PLATELET
Abs Immature Granulocytes: 0.06 10*3/uL (ref 0.00–0.07)
Basophils Absolute: 0.1 10*3/uL (ref 0.0–0.1)
Basophils Relative: 1 %
Eosinophils Absolute: 0 10*3/uL (ref 0.0–0.5)
Eosinophils Relative: 0 %
HCT: 45.8 % (ref 39.0–52.0)
Hemoglobin: 14.6 g/dL (ref 13.0–17.0)
Immature Granulocytes: 1 %
Lymphocytes Relative: 40 %
Lymphs Abs: 4.9 10*3/uL — ABNORMAL HIGH (ref 0.7–4.0)
MCH: 26.9 pg (ref 26.0–34.0)
MCHC: 31.9 g/dL (ref 30.0–36.0)
MCV: 84.5 fL (ref 80.0–100.0)
Monocytes Absolute: 0.9 10*3/uL (ref 0.1–1.0)
Monocytes Relative: 7 %
Neutro Abs: 6.5 10*3/uL (ref 1.7–7.7)
Neutrophils Relative %: 51 %
Platelets: 261 10*3/uL (ref 150–400)
RBC: 5.42 MIL/uL (ref 4.22–5.81)
RDW: 13.9 % (ref 11.5–15.5)
Smear Review: NORMAL
WBC: 12.5 10*3/uL — ABNORMAL HIGH (ref 4.0–10.5)
nRBC: 0 % (ref 0.0–0.2)

## 2019-05-20 LAB — TROPONIN I (HIGH SENSITIVITY)
Troponin I (High Sensitivity): 16 ng/L (ref <18)
Troponin I (High Sensitivity): 150 ng/L (ref <18)
Troponin I (High Sensitivity): 4100 ng/L (ref <18)

## 2019-05-20 LAB — GLUCOSE, CAPILLARY
Glucose-Capillary: 319 mg/dL — ABNORMAL HIGH (ref 70–99)
Glucose-Capillary: 252 mg/dL — ABNORMAL HIGH (ref 70–99)
Glucose-Capillary: 258 mg/dL — ABNORMAL HIGH (ref 70–99)

## 2019-05-20 LAB — SARS CORONAVIRUS 2 BY RT PCR (HOSPITAL ORDER, PERFORMED IN ~~LOC~~ HOSPITAL LAB): SARS Coronavirus 2: NEGATIVE

## 2019-05-20 LAB — APTT: aPTT: 23 seconds — ABNORMAL LOW (ref 24–36)

## 2019-05-20 LAB — ECHOCARDIOGRAM COMPLETE
Height: 70.5 in
Weight: 5600 oz

## 2019-05-20 LAB — HEPARIN LEVEL (UNFRACTIONATED): Heparin Unfractionated: <0.1 IU/mL — ABNORMAL LOW (ref 0.30–0.70)

## 2019-05-20 MED ORDER — ACETAMINOPHEN 325 MG PO TABS: 650.0000 mg | ORAL_TABLET | Freq: Four times a day (QID) | ORAL | Status: DC | PRN

## 2019-05-20 MED ORDER — SODIUM CHLORIDE 0.9% FLUSH
3.0000 mL | Freq: Two times a day (BID) | INTRAVENOUS | Status: DC
  Administered 2019-05-20 (×2): 3 mL via INTRAVENOUS

## 2019-05-20 MED ORDER — NITROGLYCERIN 2 % TD OINT
0.5000 [in_us] | TOPICAL_OINTMENT | Freq: Four times a day (QID) | TRANSDERMAL | Status: DC
  Administered 2019-05-20 (×2): 0.5 [in_us] via TOPICAL
  Filled 2019-05-20 (×2): qty 1

## 2019-05-20 MED ORDER — ATORVASTATIN CALCIUM 20 MG PO TABS
40.0000 mg | ORAL_TABLET | Freq: Every day | ORAL | Status: DC
  Administered 2019-05-20 – 2019-05-22 (×3): 40 mg via ORAL
  Filled 2019-05-20 (×3): qty 2

## 2019-05-20 MED ORDER — PNEUMOCOCCAL VAC POLYVALENT 25 MCG/0.5ML IJ INJ
0.5000 mL | INJECTION | INTRAMUSCULAR | Status: AC
  Administered 2019-05-22: 0.5 mL via INTRAMUSCULAR
  Filled 2019-05-20: qty 0.5

## 2019-05-20 MED ORDER — HYDROCHLOROTHIAZIDE 12.5 MG PO CAPS
12.5000 mg | ORAL_CAPSULE | Freq: Every day | ORAL | Status: DC
  Administered 2019-05-20 – 2019-05-22 (×3): 12.5 mg via ORAL
  Filled 2019-05-20 (×3): qty 1

## 2019-05-20 MED ORDER — SENNOSIDES-DOCUSATE SODIUM 8.6-50 MG PO TABS: 1.0000 | ORAL_TABLET | Freq: Every evening | ORAL | Status: DC | PRN

## 2019-05-20 MED ORDER — GABAPENTIN 300 MG PO CAPS
600.0000 mg | ORAL_CAPSULE | Freq: Three times a day (TID) | ORAL | Status: DC
  Administered 2019-05-20: 600 mg via ORAL
  Filled 2019-05-20: qty 2

## 2019-05-20 MED ORDER — INSULIN ASPART 100 UNIT/ML ~~LOC~~ SOLN
0.0000 [IU] | Freq: Three times a day (TID) | SUBCUTANEOUS | Status: DC
  Administered 2019-05-20: 5 [IU] via SUBCUTANEOUS
  Administered 2019-05-20: 7 [IU] via SUBCUTANEOUS
  Administered 2019-05-21: 3 [IU] via SUBCUTANEOUS
  Filled 2019-05-20 (×3): qty 1

## 2019-05-20 MED ORDER — HEPARIN BOLUS VIA INFUSION
3500.0000 [IU] | Freq: Once | INTRAVENOUS | Status: AC
  Administered 2019-05-20: 3500 [IU] via INTRAVENOUS
  Filled 2019-05-20: qty 3500

## 2019-05-20 MED ORDER — ASPIRIN 81 MG PO CHEW
324.0000 mg | CHEWABLE_TABLET | Freq: Once | ORAL | Status: DC
  Filled 2019-05-20: qty 4

## 2019-05-20 MED ORDER — ACETAMINOPHEN 650 MG RE SUPP: 650.0000 mg | Freq: Four times a day (QID) | RECTAL | Status: DC | PRN

## 2019-05-20 MED ORDER — METHADONE HCL 10 MG/ML PO CONC
140.0000 mg | Freq: Every day | ORAL | Status: DC
  Administered 2019-05-20 – 2019-05-22 (×3): 140 mg via ORAL
  Filled 2019-05-20 (×4): qty 14

## 2019-05-20 MED ORDER — METOPROLOL TARTRATE 25 MG PO TABS
25.0000 mg | ORAL_TABLET | Freq: Two times a day (BID) | ORAL | Status: DC
  Administered 2019-05-20 – 2019-05-22 (×5): 25 mg via ORAL
  Filled 2019-05-20 (×5): qty 1

## 2019-05-20 MED ORDER — NITROGLYCERIN 0.4 MG SL SUBL
0.4000 mg | SUBLINGUAL_TABLET | SUBLINGUAL | Status: AC | PRN
  Administered 2019-05-20 (×3): 0.4 mg via SUBLINGUAL
  Filled 2019-05-20: qty 1

## 2019-05-20 MED ORDER — ONDANSETRON HCL 4 MG/2ML IJ SOLN: 4.0000 mg | Freq: Four times a day (QID) | INTRAMUSCULAR | Status: DC | PRN

## 2019-05-20 MED ORDER — MORPHINE SULFATE (PF) 2 MG/ML IV SOLN
1.0000 mg | INTRAVENOUS | Status: DC | PRN
  Administered 2019-05-20 (×2): 1 mg via INTRAVENOUS
  Filled 2019-05-20 (×3): qty 1

## 2019-05-20 MED ORDER — ONDANSETRON HCL 4 MG PO TABS: 4.0000 mg | ORAL_TABLET | Freq: Four times a day (QID) | ORAL | Status: DC | PRN

## 2019-05-20 MED ORDER — ASPIRIN EC 81 MG PO TBEC
81.0000 mg | DELAYED_RELEASE_TABLET | Freq: Every day | ORAL | Status: DC
  Administered 2019-05-22: 81 mg via ORAL
  Filled 2019-05-20 (×2): qty 1

## 2019-05-20 MED ORDER — PANTOPRAZOLE SODIUM 40 MG PO TBEC
40.0000 mg | DELAYED_RELEASE_TABLET | Freq: Every day | ORAL | Status: DC
  Administered 2019-05-20 – 2019-05-22 (×3): 40 mg via ORAL
  Filled 2019-05-20 (×4): qty 1

## 2019-05-20 MED ORDER — HEPARIN BOLUS VIA INFUSION
4000.0000 [IU] | Freq: Once | INTRAVENOUS | Status: AC
  Administered 2019-05-20: 4000 [IU] via INTRAVENOUS
  Filled 2019-05-20: qty 4000

## 2019-05-20 MED ORDER — GABAPENTIN 300 MG PO CAPS
900.0000 mg | ORAL_CAPSULE | Freq: Two times a day (BID) | ORAL | Status: DC
  Administered 2019-05-20 – 2019-05-21 (×4): 900 mg via ORAL
  Filled 2019-05-20 (×4): qty 3

## 2019-05-20 MED ORDER — OXYCODONE-ACETAMINOPHEN 5-325 MG PO TABS
1.0000 | ORAL_TABLET | Freq: Once | ORAL | Status: AC
  Administered 2019-05-20: 1 via ORAL
  Filled 2019-05-20: qty 1

## 2019-05-20 MED ORDER — LISINOPRIL 20 MG PO TABS
20.0000 mg | ORAL_TABLET | Freq: Every day | ORAL | Status: DC
  Administered 2019-05-20 – 2019-05-22 (×3): 20 mg via ORAL
  Filled 2019-05-20 (×3): qty 1

## 2019-05-20 MED ORDER — NICOTINE 21 MG/24HR TD PT24
21.0000 mg | MEDICATED_PATCH | Freq: Every day | TRANSDERMAL | Status: DC
  Administered 2019-05-20 – 2019-05-22 (×3): 21 mg via TRANSDERMAL
  Filled 2019-05-20 (×3): qty 1

## 2019-05-20 MED ORDER — INFLUENZA VAC SPLIT QUAD 0.5 ML IM SUSY
0.5000 mL | PREFILLED_SYRINGE | INTRAMUSCULAR | Status: AC
  Administered 2019-05-22: 0.5 mL via INTRAMUSCULAR
  Filled 2019-05-20: qty 0.5

## 2019-05-20 MED ORDER — ACETAMINOPHEN 325 MG PO TABS
650.0000 mg | ORAL_TABLET | Freq: Four times a day (QID) | ORAL | Status: DC | PRN
  Administered 2019-05-20 – 2019-05-21 (×2): 650 mg via ORAL
  Filled 2019-05-20 (×2): qty 2

## 2019-05-20 MED ORDER — INSULIN ASPART 100 UNIT/ML ~~LOC~~ SOLN
0.0000 [IU] | Freq: Every day | SUBCUTANEOUS | Status: DC
  Administered 2019-05-20 – 2019-05-21 (×2): 3 [IU] via SUBCUTANEOUS
  Filled 2019-05-20 (×2): qty 1

## 2019-05-20 MED ORDER — LIDOCAINE VISCOUS HCL 2 % MT SOLN
15.0000 mL | Freq: Once | OROMUCOSAL | Status: AC
  Administered 2019-05-20: 15 mL via ORAL
  Filled 2019-05-20: qty 15

## 2019-05-20 MED ORDER — EZETIMIBE 10 MG PO TABS
10.0000 mg | ORAL_TABLET | Freq: Every day | ORAL | Status: DC
  Administered 2019-05-20 – 2019-05-22 (×3): 10 mg via ORAL
  Filled 2019-05-20 (×3): qty 1

## 2019-05-20 MED ORDER — HEPARIN (PORCINE) 25000 UT/250ML-% IV SOLN
2100.0000 [IU]/h | INTRAVENOUS | Status: DC
  Administered 2019-05-20: 20:00:00 1700 [IU]/h via INTRAVENOUS
  Administered 2019-05-20: 08:00:00 1350 [IU]/h via INTRAVENOUS
  Administered 2019-05-21: 2100 [IU]/h via INTRAVENOUS
  Filled 2019-05-20 (×3): qty 250

## 2019-05-20 MED ORDER — GABAPENTIN 300 MG PO CAPS
600.0000 mg | ORAL_CAPSULE | Freq: Every day | ORAL | Status: DC
  Administered 2019-05-21 – 2019-05-22 (×2): 600 mg via ORAL
  Filled 2019-05-20 (×2): qty 2

## 2019-05-20 MED ORDER — PERFLUTREN LIPID MICROSPHERE
1.0000 mL | INTRAVENOUS | Status: AC | PRN
  Administered 2019-05-20: 12:00:00 3 mL via INTRAVENOUS
  Filled 2019-05-20: qty 10

## 2019-05-20 MED ORDER — ALUM & MAG HYDROXIDE-SIMETH 200-200-20 MG/5ML PO SUSP
15.0000 mL | Freq: Once | ORAL | Status: AC
  Administered 2019-05-20: 15 mL via ORAL
  Filled 2019-05-20: qty 30

## 2019-05-20 MED ORDER — INSULIN GLARGINE 100 UNIT/ML ~~LOC~~ SOLN
18.0000 [IU] | Freq: Every day | SUBCUTANEOUS | Status: DC
  Administered 2019-05-20: 18 [IU] via SUBCUTANEOUS
  Filled 2019-05-20 (×2): qty 0.18

## 2019-05-20 NOTE — Consult Note (Signed)
Cardiology Consultation Note    Patient ID: Michaela Shankel, MRN: [id number], DOB/AGE: 1971-04-02 48 y.o. Admit date: 05/20/2019   Date of Consult: 05/20/2019 Primary Physician: Zachery Dauer, FNP Primary Cardiologist:    Chief Complaint: chest pain Reason for Consultation: chest pain/abnormal troponin Requesting MD: Dr. Enedina Finner  HPI: Luis [name] is a 48 y.o. male with history of hypertension and diabetes who presented to the emergency room with complaints of chest pain.  States he is awoken from sleep with a burning pain in the center of his chest.  It was constantly present since that occurred.  It is not pleuritic in nature.  Risk factors include hypertension, diabetes and tobacco abuse.  He also has a history methadone use, sleep apnea.  Initial troponin was 16 with a subsequent value high-sensitivity troponin of 150.  EKG revealed sinus rhythm with nonspecific ST-T wave changes.  No ischemia or injury noted.  Chest x-ray showed chronic bronchitic changes no acute cardiopulmonary disease.  Patient states the pain is constant.  He also states that he had some nausea and vomiting yesterday prior to the pain occurring.  He reports a family history of heart disease.  He smokes cigarettes but denies ethanol intake.  He is on methadone but denies illicit drug use.  Past Medical History:  Diagnosis Date  . Back pain   . Diabetes mellitus without complication (HCC)   . High cholesterol   . Hypertension   . Neuropathy       Surgical History:  Past Surgical History:  Procedure Laterality Date  . KNEE SURGERY Right      Home Meds: Prior to Admission medications   Medication Sig Start Date End Date Taking? Authorizing Provider  atorvastatin (LIPITOR) 40 MG tablet Take 1 tablet (40 mg total) by mouth daily for 30 days. 10/18/18 05/20/19 Yes Doles-Johnson, Teah, NP  ezetimibe (ZETIA) 10 MG tablet Take 1 tablet (10 mg total) by mouth daily. 05/10/19  Yes McGowan, Carollee Herter A, PA-C   gabapentin (NEURONTIN) 300 MG capsule TAKE 2 CAPSULES BY MOUTH EVERY MORNING, 3 CAPSULES IN THE AFTERNOON AND 3 CAPSULES EVERY EVENING 03/26/19  Yes Doles-Johnson, Teah, NP  glimepiride (AMARYL) 4 MG tablet TAKE ONE TABLET BY MOUTH EVERY DAY WITH BREAKFAST 05/02/19 06/01/19 Yes McGowan, Shannon A, PA-C  hydrochlorothiazide (MICROZIDE) 12.5 MG capsule TAKE ONE CAPSULE BY MOUTH EVERY DAY 05/02/19 06/01/19 Yes McGowan, Carollee Herter A, PA-C  ibuprofen (ADVIL,MOTRIN) 200 MG tablet Take 200 mg by mouth every 6 (six) hours as needed.   Yes [provider]  insulin glargine (LANTUS) 100 UNIT/ML injection Inject 0.17 mLs (17 Units total) into the skin at bedtime. Patient taking differently: Inject 18 Units into the skin at bedtime.  02/21/19  Yes McGowan, Carollee Herter A, PA-C  lisinopril (ZESTRIL) 20 MG tablet TAKE ONE TABLET BY MOUTH EVERY DAY 05/02/19 06/01/19 Yes McGowan, Carollee Herter A, PA-C  metFORMIN (GLUCOPHAGE) 500 MG tablet Take 2 tablets (1,000 mg total) by mouth 2 (two) times daily. 01/31/19  Yes Doles-Johnson, Teah, NP  methadone (DOLOPHINE) 10 MG/5ML solution Take 140 mg by mouth daily.     [provider]    Inpatient Medications:  . aspirin  324 mg Oral Once  . heparin  4,000 Units Intravenous Once   . heparin      Allergies: No Known Allergies  Social History   Socioeconomic History  . Marital status: Divorced    Spouse name: Not on file  . Number of children: 2  .  Years of education: Not on file  . Highest education level: High school graduate  Occupational History  . Not on file  Social Needs  . Financial resource strain: Hard  . Food insecurity    Worry: Never true    Inability: Never true  . Transportation needs    Medical: No    Non-medical: No  Tobacco Use  . Smoking status: Current Every Day Smoker    Packs/day: 1.00    Years: 30.00    Pack years: 30.00    Types: Cigarettes  . Smokeless tobacco: Never Used  Substance and Sexual Activity  . Alcohol use: Yes     Alcohol/week: 3.0 standard drinks    Types: 3 Cans of beer per week  . Drug use: No  . Sexual activity: Not Currently    Birth control/protection: Condom  Lifestyle  . Physical activity    Days per week: 0 days    Minutes per session: 0 min  . Stress: Very much  Relationships  . Social connections    Talks on phone: More than three times a week    Gets together: More than three times a week    Attends religious service: More than 4 times per year    Active member of club or organization: No    Attends meetings of clubs or organizations: Never    Relationship status: Divorced  . Intimate partner violence    Fear of current or ex partner: No    Emotionally abused: No    Physically abused: No    Forced sexual activity: No  Other Topics Concern  . Not on file  Social History Narrative   On food stamps, which helps with food. Living with mom as has been unemployed due to nueropathy.     Family History  Problem Relation Age of Onset  . Cancer Mother   . Heart disease Father   . Diabetes Paternal Grandfather      Review of Systems: A 12-system review of systems was performed and is negative except as noted in the HPI.  Labs: No results for input(s): CKTOTAL, CKMB, TROPONINI in the last 72 hours. Lab Results  Component Value Date   WBC 12.5 (H) 05/20/2019   HGB 14.6 05/20/2019   HCT 45.8 05/20/2019   MCV 84.5 05/20/2019   PLT 261 05/20/2019    Recent Labs  Lab 05/20/19 0430  NA 137  K 4.0  CL 95*  CO2 30  BUN 12  CREATININE 0.65  CALCIUM 9.0  GLUCOSE 310*   Lab Results  Component Value Date   CHOL 227 (H) 05/09/2019   HDL 27 (L) 05/09/2019   LDLCALC 115 (H) 09/26/2018   TRIG 664 ([place]) 05/09/2019   No results found for: DDIMER  Radiology/Studies:  Dg Chest Portable 1 View  Result Date: 05/20/2019 CLINICAL DATA:  Chest pain EXAM: PORTABLE CHEST 1 VIEW COMPARISON:  04/15/2010 FINDINGS: Normal heart size and mediastinal contours for technique. Chronic  interstitial coarsening and streaky density. Generous lung volumes. There is no edema, consolidation, effusion, or pneumothorax. IMPRESSION: Chronic bronchitic markings.  No acute finding. Electronically Signed   By: Monte Fantasia M.D.   On: 05/20/2019 05:39    Wt Readings from Last 3 Encounters:  05/20/19 (!) 158.8 kg  05/09/19 (!) 160.8 kg  02/21/19 (!) 160.2 kg    EKG: Sinus rhythm with no ischemia  Physical Exam: Obese Caucasian male Blood pressure (!) 152/83, pulse 91, temperature 98.4 F (36.9 C), temperature source  Oral, resp. rate 17, height 5' 10.5" (1.791 m), weight (!) 158.8 kg, SpO2 93 %. Body mass index is 49.51 kg/m. General: Well developed, well nourished, in no acute distress. Head: Normocephalic, atraumatic, sclera non-icteric, no xanthomas, nares are without discharge.  Neck: Negative for carotid bruits. JVD not elevated. Lungs: Clear bilaterally to auscultation without wheezes, rales, or rhonchi. Breathing is unlabored. Heart: RRR with S1 S2. No murmurs, rubs, or gallops appreciated. Abdomen: Soft, non-tender, non-distended with normoactive bowel sounds. No hepatomegaly. No rebound/guarding. No obvious abdominal masses. Msk:  Strength and tone appear normal for age. Extremities: No clubbing or cyanosis. No edema.  Distal pedal pulses are 2+ and equal bilaterally. Neuro: Alert and oriented X 3. No facial asymmetry. No focal deficit. Moves all extremities spontaneously. Psych:  Responds to questions appropriately with a normal affect.     Assessment and Plan  48 year old male with chest pain with both typical atypical features.  Initial high-sensitivity troponin was 18 subsequent was 150.  Electrocardiogram is unremarkable.  Patient is not worsened by position change or deep breathing.  He does have some epigastric pain which is different than his chest pain.  Echocardiogram is pending.  Will follow subsequent troponins to see if there is a trend upwards.  We will  continue with heparin for now.  Further recommendations will depend on symptoms, subsequent troponins and electrocardiogram.  Signed, Dalia Heading MD 05/20/2019, 7:46 AM Pager: [phone]

## 2019-05-20 NOTE — ED Notes (Signed)
Pts mother has arrived.

## 2019-05-20 NOTE — ED Notes (Signed)
Floor unable to take report at this time.

## 2019-05-20 NOTE — Consult Note (Signed)
ANTICOAGULATION CONSULT NOTE - Initial Consult  Pharmacy Consult for Heparin dosing and monitoring  Indication: chest pain/ACS  No Known Allergies  Patient Measurements: Height: 5' 10.5" (179.1 cm) Weight: (!) 350 lb (158.8 kg) IBW/kg (Calculated) : 74.15 Heparin Dosing Weight: 112 kg  Vital Signs: Temp: 98.4 F (36.9 C) (09/28 0412) Temp Source: Oral (09/28 0412) BP: 152/83 (09/28 0730) Pulse Rate: 91 (09/28 0730)  Labs: Recent Labs    05/20/19 0430 05/20/19 0629  HGB 14.6  --   HCT 45.8  --   PLT 261  --   CREATININE 0.65  --   TROPONINIHS 16 150*    Estimated Creatinine Clearance: 174.4 mL/min (by C-G formula based on SCr of 0.65 mg/dL).   Medical History: Past Medical History:  Diagnosis Date  . Back pain   . Diabetes mellitus without complication ([place])   . High cholesterol   . Hypertension   . Neuropathy     Assessment: Pharmacy consulted for heparin infusion dosing and monitoring for 48 yo male for ACS/STEMI. No reported anticoagulants PTA.   Troponin (HS): 16 --> 150   Goal of Therapy:  Heparin level 0.3-0.7 units/ml Monitor platelets by anticoagulation protocol: Yes   Plan:  Heparin DW: 112 kg Give 4000 units bolus x 1 Start heparin infusion at 1350 units/hr Check anti-Xa level in 6 hours and daily while on heparin Continue to monitor H&H and platelets  Pernell Dupre, PharmD, BCPS Clinical Pharmacist 05/20/2019 7:40 AM

## 2019-05-20 NOTE — ED Notes (Signed)
Gave pt phone to call family.

## 2019-05-20 NOTE — ED Triage Notes (Signed)
Pt reports being awakened around 3am with discomfort to the center of his chest; burning sensation; pt says he went to a cookout Saturday and did experience some nausea after eating hot dogs and hamburgers; pt says pain is non-radiating;

## 2019-05-20 NOTE — ED Notes (Signed)
Pt sitting on the end of the bed for comfort; pain medication given as ordered

## 2019-05-20 NOTE — ED Notes (Signed)
Radiology tech at bedside for portable films

## 2019-05-20 NOTE — ED Notes (Signed)
Dr Charna Archer at bedside; pt says "I think I just got a bad case of the heartburn"

## 2019-05-20 NOTE — ED Notes (Signed)
Pt asleep when this RN entered the room; awakened easily; pt says the percocet given earlier has not touched his pain;

## 2019-05-20 NOTE — Progress Notes (Signed)
*  PRELIMINARY RESULTS* Echocardiogram 2D Echocardiogram has been performed.  Luis [name] 05/20/2019, 12:05 PM

## 2019-05-20 NOTE — ED Notes (Signed)
Pt up standing beside bed, uncomfortable sitting on the stretcher

## 2019-05-20 NOTE — Consult Note (Signed)
ANTICOAGULATION CONSULT NOTE - Initial Consult  Pharmacy Consult for Heparin dosing and monitoring  Indication: chest pain/ACS  No Known Allergies  Patient Measurements: Height: 5' 10.5" (179.1 cm) Weight: (!) 350 lb (158.8 kg) IBW/kg (Calculated) : 74.15 Heparin Dosing Weight: 112 kg  Vital Signs: Temp: 98.3 F (36.8 C) (09/28 0940) Temp Source: Oral (09/28 0940) BP: 161/97 (09/28 0940) Pulse Rate: 93 (09/28 0940)  Labs: Recent Labs    05/20/19 0430 05/20/19 0629 05/20/19 0736 05/20/19 1111 05/20/19 1401  HGB 14.6  --   --   --   --   HCT 45.8  --   --   --   --   PLT 261  --   --   --   --   APTT  --   --  23*  --   --   LABPROT  --   --  12.3  --   --   INR  --   --  0.9  --   --   HEPARINUNFRC  --   --   --   --  <0.10*  CREATININE 0.65  --   --   --   --   TROPONINIHS 16 150*  --  4,100*  --     Estimated Creatinine Clearance: 174.4 mL/min (by C-G formula based on SCr of 0.65 mg/dL).   Medical History: Past Medical History:  Diagnosis Date  . Back pain   . Diabetes mellitus without complication ([place])   . High cholesterol   . Hypertension   . Neuropathy     Assessment: Pharmacy consulted for heparin infusion dosing and monitoring for 48 yo male for ACS/STEMI. No reported anticoagulants PTA.   Troponin (HS): 16 --> 150   Heparin DW: 112 kg  Initiated 9/28 @ 0751 Gave 4000 units bolus and then 1350 units/hr  Goal of Therapy:  Heparin level 0.3-0.7 units/ml Monitor platelets by anticoagulation protocol: Yes   Plan:  HL returned < 0.10 - verified no interruptions with nurse Caryl Pina  Will bolus 3500 units and increase drip rate to 1700 units/hr  Will check anti-Xa level in 6 hours and daily while on heparin Continue to monitor H&H and platelets  Lu Duffel, PharmD, BCPS Clinical Pharmacist 05/20/2019 3:36 PM

## 2019-05-20 NOTE — ED Notes (Addendum)
Spoke with Dr Charna Archer regarding pt's complaint; verbal order given for pain medication

## 2019-05-20 NOTE — Progress Notes (Signed)
Ch visited pt regarding OR for AD. Pt was being attended to by his brother. Ch shared that she would f/u with pt once he gets settled. Pt was c/o still having chest pains. Pt mother will be bedside when brother leaves.     05/20/19 1000  Clinical Encounter Type  Visited With Patient and family together  Visit Type Other (Comment) (AD education )  Referral From Physician  Consult/Referral To Chaplain  Stress Factors  Patient Stress Factors Exhausted;Health changes;Loss of control;Major life changes

## 2019-05-20 NOTE — ED Notes (Addendum)
Pt shouting out the door to this RN even though call bell is in reach; pt says "I got air coming in, I need to sit up"; HOB elevated at pt request; pt says the medication he was given has not helped at all; will notify MD; pain still 10/10

## 2019-05-20 NOTE — ED Provider Notes (Addendum)
[facility] Emergency Department Provider Note   ____________________________________________   First MD Initiated Contact with Patient 05/20/19 0411     (approximate)  I have reviewed the triage vital signs and the nursing notes.   HISTORY  Chief Complaint Chest Pain     HPI Luis [name] is a 48 y.o. male with past medical history of hypertension and diabetes who presents to the ED complaining of chest pain.  Patient reports he was woken from sleep with burning pain in the center of his chest.  Pain is been present constantly since then, not associated with exertion or deep breath.  He states he has recently been feeling well with no fevers, cough, or shortness of breath.  He states pain feels like heartburn, but that his mother was concerned and called EMS.  He denies any cardiac history, does state that he smokes 1 pack/day cigarettes.        Past Medical History:  Diagnosis Date  . Back pain   . Diabetes mellitus without complication ([place])   . High cholesterol   . Hypertension   . Neuropathy     Patient Active Problem List   Diagnosis Date Noted  . Dyslipidemia 07/06/2018  . GERD (gastroesophageal reflux disease) 07/05/2018  . Obesity, unspecified 06/19/2017  . Hypertension 12/01/2016  . Controlled type 2 diabetes mellitus with complication, with long-term current use of insulin ([place]) 12/01/2016  . 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 OSA (obstructive sleep apnea) 05/06/2013  . Chronic back pain 12/25/2011    Past Surgical History:  Procedure Laterality Date  . KNEE SURGERY Right     Prior to Admission medications   Medication Sig Start Date End Date Taking? Authorizing Provider  atorvastatin (LIPITOR) 40 MG tablet Take 1 tablet (40 mg total) by mouth daily for 30 days. 10/18/18 05/20/19 Yes Doles-Johnson, Teah, NP  ezetimibe (ZETIA) 10 MG tablet Take 1 tablet (10 mg total) by mouth daily. 05/10/19  Yes McGowan,  Larene Beach A, PA-C  gabapentin (NEURONTIN) 300 MG capsule TAKE 2 CAPSULES BY MOUTH EVERY MORNING, 3 CAPSULES IN THE AFTERNOON AND 3 CAPSULES EVERY EVENING 03/26/19  Yes Doles-Johnson, Teah, NP  glimepiride (AMARYL) 4 MG tablet TAKE ONE TABLET BY MOUTH EVERY DAY WITH BREAKFAST 05/02/19 06/01/19 Yes McGowan, Shannon A, PA-C  hydrochlorothiazide (MICROZIDE) 12.5 MG capsule TAKE ONE CAPSULE BY MOUTH EVERY DAY 05/02/19 06/01/19 Yes McGowan, Larene Beach A, PA-C  ibuprofen (ADVIL,MOTRIN) 200 MG tablet Take 200 mg by mouth every 6 (six) hours as needed.   Yes [provider]  insulin glargine (LANTUS) 100 UNIT/ML injection Inject 0.17 mLs (17 Units total) into the skin at bedtime. Patient taking differently: Inject 18 Units into the skin at bedtime.  02/21/19  Yes McGowan, Larene Beach A, PA-C  lisinopril (ZESTRIL) 20 MG tablet TAKE ONE TABLET BY MOUTH EVERY DAY 05/02/19 06/01/19 Yes McGowan, Larene Beach A, PA-C  metFORMIN (GLUCOPHAGE) 500 MG tablet Take 2 tablets (1,000 mg total) by mouth 2 (two) times daily. 01/31/19  Yes Doles-Johnson, Teah, NP  methadone (DOLOPHINE) 10 MG/5ML solution Take 140 mg by mouth daily.     [provider]    Allergies Patient has no known allergies.  Family History  Problem Relation Age of Onset  . Cancer Mother   . Heart disease Father   . Diabetes Paternal Grandfather     Social History Social History   Tobacco Use  . Smoking status: Current Every Day Smoker    Packs/day: 1.00  Years: 30.00    Pack years: 30.00    Types: Cigarettes  . Smokeless tobacco: Never Used  Substance Use Topics  . Alcohol use: Yes    Alcohol/week: 3.0 standard drinks    Types: 3 Cans of beer per week  . Drug use: No    Review of Systems  Constitutional: No fever/chills Eyes: No visual changes. ENT: No sore throat. Cardiovascular: Denies chest pain. Respiratory: Denies shortness of breath. Gastrointestinal: No abdominal pain.  No nausea, no vomiting.  No diarrhea.  No  constipation. Genitourinary: Negative for dysuria. Musculoskeletal: Negative for back pain. Skin: Negative for rash. Neurological: Negative for headaches, focal weakness or numbness.  ____________________________________________   PHYSICAL EXAM:  VITAL SIGNS: ED Triage Vitals  Enc Vitals Group     BP      Pulse      Resp      Temp      Temp src      SpO2      Weight      Height      Head Circumference      Peak Flow      Pain Score      Pain Loc      Pain Edu?      Excl. in GC?     Constitutional: Alert and oriented.  Obese. Eyes: Conjunctivae are normal. Head: Atraumatic. Nose: No congestion/rhinnorhea. Mouth/Throat: Mucous membranes are moist. Neck: Normal ROM Cardiovascular: Normal rate, regular rhythm. Grossly normal heart sounds. Respiratory: Normal respiratory effort.  No retractions. Lungs CTAB. Gastrointestinal: Soft and nontender. No distention. Genitourinary: deferred Musculoskeletal: No lower extremity tenderness nor edema. Neurologic:  Normal speech and language. No gross focal neurologic deficits are appreciated. Skin:  Skin is warm, dry and intact. No rash noted. Psychiatric: Mood and affect are normal. Speech and behavior are normal.  ____________________________________________   LABS (all labs ordered are listed, but only abnormal results are displayed)  Labs Reviewed  CBC WITH DIFFERENTIAL/PLATELET - Abnormal; Notable for the following components:      Result Value   WBC 12.5 (*)    Lymphs Abs 4.9 (*)    All other components within normal limits  BASIC METABOLIC PANEL - Abnormal; Notable for the following components:   Chloride 95 (*)    Glucose, Bld 310 (*)    All other components within normal limits  TROPONIN I (HIGH SENSITIVITY) - Abnormal; Notable for the following components:   Troponin I (High Sensitivity) 150 (*)    All other components within normal limits  SARS CORONAVIRUS 2 (HOSPITAL ORDER, PERFORMED IN [facility]  LAB)  TROPONIN I (HIGH SENSITIVITY)   ____________________________________________  EKG  ED ECG REPORT I, Chesley Noon, the attending physician, personally viewed and interpreted this ECG.   Date: 05/20/2019  EKG Time: 4:12  Rate: 84  Rhythm: normal sinus rhythm  Axis: Normal  Intervals:none  ST&T Change: none    PROCEDURES  Procedure(s) performed (including Critical Care):  .Critical Care Performed by: Chesley Noon, MD Authorized by: Chesley Noon, MD   Critical care provider statement:    Critical care time (minutes):  45   Critical care time was exclusive of:  Separately billable procedures and treating other patients and teaching time   Critical care was necessary to treat or prevent imminent or life-threatening deterioration of the following conditions:  Cardiac failure   Critical care was time spent personally by me on the following activities:  Discussions with consultants, evaluation of patient's response to  treatment, examination of patient, ordering and performing treatments and interventions, ordering and review of laboratory studies, ordering and review of radiographic studies, pulse oximetry, re-evaluation of patient's condition, obtaining history from patient or surrogate and review of old charts   I assumed direction of critical care for this patient from another provider in my specialty: no       ____________________________________________   INITIAL IMPRESSION / ASSESSMENT AND PLAN / ED COURSE       48 year old male with history of hypertension and diabetes presents to the ED with burning pain at the center of his chest present since 3 AM this morning.  EKG without acute ischemic changes, low suspicion for ACS as symptoms sound more consistent with reflux, will treat with GI cocktail.  Patient has a heart score of 3, if 2 sets of troponin are negative he would be appropriate for discharge home with PCP follow-up.  We will additionally check chest  x-ray.  Do not suspect PE as he is PERC negative.  Initial troponin within normal limits, however patient noted to have significantly elevated second set troponin at 150.  He now states that pain is "easing off" and repeat EKG without any apparent ischemic changes or evidence of STEMI.  Will load with aspirin and start on heparin drip.  Case discussed with hospitalist, who accepts patient for admission.      ____________________________________________   FINAL CLINICAL IMPRESSION(S) / ED DIAGNOSES  Final diagnoses:  NSTEMI (non-ST elevated myocardial infarction) (HCC)  Chest pain, unspecified type  Essential hypertension  Controlled type 2 diabetes mellitus with complication, with long-term current use of insulin [facility])     ED Discharge Orders    None       Note:  This document was prepared using Dragon voice recognition software and may include unintentional dictation errors.   Chesley Noon, MD 05/20/19 3664    Chesley Noon, MD 06/12/19 1230

## 2019-05-20 NOTE — Progress Notes (Signed)
/  CRITICAL VALUE ALERT  Critical Value: troponin 4100  Date & Time Notied: 05/20/19 12:05 Provider Notified: Fritzi Mandes, Bartholome Bill  Orders Received/Actions taken: continue to monitor, patient is on heparin gtt at this time already, MD says he will cath tomorrow

## 2019-05-20 NOTE — Progress Notes (Addendum)
Inpatient Diabetes Program Recommendations  AACE/ADA: New Consensus Statement on Inpatient Glycemic Control (2015)  Target Ranges:  Prepandial:   less than 140 mg/dL      Peak postprandial:   less than 180 mg/dL (1-2 hours)      Critically ill patients:  140 - 180 mg/dL   Results for IMMANUEL, FEDAK (MRN [id number]) as of 05/20/2019 08:49  Ref. Range 05/20/2019 04:30  Glucose Latest Ref Range: 70 - 99 mg/dL 310 (H)   Results for SOPHEAP, BOEHLE (MRN [id number]) as of 05/20/2019 08:49  Ref. Range 09/26/2018 10:40 01/10/2019 18:22 05/09/2019 19:31  Hemoglobin A1C Latest Ref Range: 4.8 - 5.6 % 10.7 (H) 11.0 (H) 11.2 (H)  (275 mg/dl)     To ED with CP/ Abnromal Troponin  History: DM   Home DM Meds: Lantus 18 units QHS       Amaryl 4 mg Daily       Metformin 1000 mg BID   Current Orders: Lantus 18 units QHS       Novolog Sensitive Correction Scale/ SSI (0-9 units) TID AC + HS      (Signed and Held orders)     Still down in the ED as of 8:45am.   Will be admitted.   Last A1c was 11.2% on 09/17--Seen by PCP Zara Council) at the [facility] and Insulin was adjusted.    Addendum 12:15pm- Met with pt today.  Discussed with pt that we have started him on his home dose of Insulin.  Oral home DM meds currently on hold.  Spoke with patient about his current A1c of  11.2%.  Explained what an A1c is and what it measures.  Reminded patient that his goal A1c is 7% or less per ADA standards to prevent both acute and long-term complications.  Explained to patient the extreme importance of good glucose control at home.  Encouraged patient to check his CBGs at least bid at home (fasting and another check within the day) and to record all CBGs in a logbook for his PCP to review.  Encouraged close follow up with his PCP for further DM management.  Pt stated he takes his meds as instructed.  No issues taking meds.  Does not check CBGs often.  Encouraged more frequent CBG checks at  home.     --Will follow patient during hospitalization--  Wyn Quaker RN, MSN, CDE Diabetes Coordinator Inpatient Glycemic Control Team Team Pager: [phone] (8a-5p)

## 2019-05-20 NOTE — H&P (Signed)
[facility] [facility] [place] NAME: Luis [name]    MR#:  [id number]  DATE OF BIRTH:  05/02/1971  DATE OF ADMISSION:  05/20/2019  PRIMARY CARE PHYSICIAN: Boyce Medici, FNP   REQUESTING/REFERRING PHYSICIAN: Dr Charna Archer  CHIEF COMPLAINT:  burning in the chest since 3 AM HISTORY OF PRESENT ILLNESS:  Luis [name]  is a 48 y.o. male with a known history of uncontrolled type II diabetes on insulin, peripheral neuropathy, hypertension, severe obesity, and chronic back pain on methadone comes to the emergency room after he wakes up getting chest pain/burning around 3 AM.  In the ER patient's first troponin was 16----150-- 4100. Patient was started on IV heparin drip. He was given aspirin. Cardiology Dr. Bethanne Ginger outpatient recommends cardiac cath in the morning.  Patient continues to have some chest pain. Brother in the room.  PAST MEDICAL HISTORY:   Past Medical History:  Diagnosis Date  . Back pain   . Diabetes mellitus without complication ([place])   . High cholesterol   . Hypertension   . Neuropathy     PAST SURGICAL HISTOIRY:   Past Surgical History:  Procedure Laterality Date  . KNEE SURGERY Right     SOCIAL HISTORY:   Social History   Tobacco Use  . Smoking status: Current Every Day Smoker    Packs/day: 1.00    Years: 30.00    Pack years: 30.00    Types: Cigarettes  . Smokeless tobacco: Never Used  Substance Use Topics  . Alcohol use: Yes    Alcohol/week: 3.0 standard drinks    Types: 3 Cans of beer per week    FAMILY HISTORY:   Family History  Problem Relation Age of Onset  . Cancer Mother   . Heart disease Father   . Diabetes Paternal Grandfather     DRUG ALLERGIES:  No Known Allergies  REVIEW OF SYSTEMS:  Review of Systems  Constitutional: Negative for chills, fever and weight loss.  HENT: Negative for ear discharge, ear pain and nosebleeds.   Eyes: Negative for blurred vision, pain and discharge.   Respiratory: Negative for sputum production, shortness of breath, wheezing and stridor.   Cardiovascular: Positive for chest pain. Negative for palpitations, orthopnea and PND.  Gastrointestinal: Positive for heartburn. Negative for abdominal pain, diarrhea, nausea and vomiting.  Genitourinary: Negative for frequency and urgency.  Musculoskeletal: Negative for back pain and joint pain.  Neurological: Negative for sensory change, speech change, focal weakness and weakness.  Psychiatric/Behavioral: Negative for depression and hallucinations. The patient is not nervous/anxious.      MEDICATIONS AT HOME:   Prior to Admission medications   Medication Sig Start Date End Date Taking? Authorizing Provider  atorvastatin (LIPITOR) 40 MG tablet Take 1 tablet (40 mg total) by mouth daily for 30 days. 10/18/18 05/20/19 Yes Doles-Johnson, Teah, NP  ezetimibe (ZETIA) 10 MG tablet Take 1 tablet (10 mg total) by mouth daily. 05/10/19  Yes McGowan, Larene Beach A, PA-C  gabapentin (NEURONTIN) 300 MG capsule TAKE 2 CAPSULES BY MOUTH EVERY MORNING, 3 CAPSULES IN THE AFTERNOON AND 3 CAPSULES EVERY EVENING Patient taking differently: Take 600-900 mg by mouth See admin instructions. Take 2 capsules (600mg ) by mouth every morning then take 3 capsules (900mg ) by mouth every afternoon and evening 03/26/19  Yes Doles-Johnson, Teah, NP  glimepiride (AMARYL) 4 MG tablet TAKE ONE TABLET BY MOUTH EVERY DAY WITH BREAKFAST Patient taking differently: Take 4 mg by mouth daily with breakfast.  05/02/19 06/01/19  Yes McGowan, Shannon A, PA-C  hydrochlorothiazide (MICROZIDE) 12.5 MG capsule TAKE ONE CAPSULE BY MOUTH EVERY DAY Patient taking differently: Take 12.5 mg by mouth daily.  05/02/19 06/01/19 Yes McGowan, Carollee Herter A, PA-C  ibuprofen (ADVIL,MOTRIN) 200 MG tablet Take 200-400 mg by mouth every 6 (six) hours as needed for fever or mild pain.    Yes [provider]  insulin glargine (LANTUS) 100 UNIT/ML injection Inject 0.17 mLs  (17 Units total) into the skin at bedtime. Patient taking differently: Inject 18 Units into the skin at bedtime.  02/21/19  Yes McGowan, Carollee Herter A, PA-C  lisinopril (ZESTRIL) 20 MG tablet TAKE ONE TABLET BY MOUTH EVERY DAY Patient taking differently: Take 20 mg by mouth daily.  05/02/19 06/01/19 Yes McGowan, Carollee Herter A, PA-C  metFORMIN (GLUCOPHAGE) 500 MG tablet Take 2 tablets (1,000 mg total) by mouth 2 (two) times daily. 01/31/19  Yes Doles-Johnson, Teah, NP  methadone (DOLOPHINE) 10 MG/5ML solution Take 140 mg by mouth daily.    Yes [provider]      VITAL SIGNS:  Blood pressure (!) 161/97, pulse 93, temperature 98.3 F (36.8 C), temperature source Oral, resp. rate 20, height 5' 10.5" (1.791 m), weight (!) 158.8 kg, SpO2 96 %.  PHYSICAL EXAMINATION:  GENERAL:  48 y.o.-year-old patient lying in the bed with no acute distress. Morbidly obese EYES: Pupils equal, round, reactive to light and accommodation. No scleral icterus. Extraocular muscles intact.  HEENT: Head atraumatic, normocephalic. Oropharynx and nasopharynx clear.  NECK:  Supple, no jugular venous distention. No thyroid enlargement, no tenderness.  LUNGS: Normal breath sounds bilaterally, no wheezing, rales,rhonchi or crepitation. No use of accessory muscles of respiration.  CARDIOVASCULAR: S1, S2 normal. No murmurs, rubs, or gallops.  ABDOMEN: Soft, nontender, nondistended. Bowel sounds present. No organomegaly or mass.  EXTREMITIES: No pedal edema, cyanosis, or clubbing.  NEUROLOGIC: Cranial nerves II through XII are intact. Muscle strength 5/5 in all extremities. Sensation intact. Gait not checked.  PSYCHIATRIC: The patient is alert and oriented x 3.  SKIN: No obvious rash, lesion, or ulcer.   LABORATORY PANEL:   CBC Recent Labs  Lab 05/20/19 0430  WBC 12.5*  HGB 14.6  HCT 45.8  PLT 261    ------------------------------------------------------------------------------------------------------------------  Chemistries  Recent Labs  Lab 05/20/19 0430  NA 137  K 4.0  CL 95*  CO2 30  GLUCOSE 310*  BUN 12  CREATININE 0.65  CALCIUM 9.0   ------------------------------------------------------------------------------------------------------------------  Cardiac Enzymes No results for input(s): TROPONINI in the last 168 hours. ------------------------------------------------------------------------------------------------------------------  RADIOLOGY:  Dg Chest Portable 1 View  Result Date: 05/20/2019 CLINICAL DATA:  Chest pain EXAM: PORTABLE CHEST 1 VIEW COMPARISON:  04/15/2010 FINDINGS: Normal heart size and mediastinal contours for technique. Chronic interstitial coarsening and streaky density. Generous lung volumes. There is no edema, consolidation, effusion, or pneumothorax. IMPRESSION: Chronic bronchitic markings.  No acute finding. Electronically Signed   By: Marnee Spring M.D.   On: 05/20/2019 05:39    EKG:    IMPRESSION AND PLAN:   Luis [name]  is a 48 y.o. male with a known history of uncontrolled type II diabetes on insulin, peripheral neuropathy, hypertension, severe obesity, and chronic back pain on methadone comes to the emergency room after he wakes up getting chest pain/burning around 3 AM.  1. Acute coronary syndrome -multiple risk factor with obesity, hyperlipidemia, uncontrolled diabetes, hypertension and family history of CAD -admit to telemetry -IV heparin drip, nitro paste, PRN Nitro, aspirin, beta blockers -cardiology consultation with Dr. Lady Gary  appreciated. Cardiac cath tomorrow -PRN IV morphine  2. Uncontrolled type II diabetes with dietary noncompliance -A1c 11.3 -continue sliding scale and Lantus. Diabetes coordinator to see -hold metformin pending cardiac cath  3. Hypertension -continue home meds -lisinopril and  hydrochlorothiazide -add beta-blockers  4. Peripheral neuropathy due to diabetes -continue gabapentin  5. Chronic back pain -on methadone--- dose was verified by patient's pain clinic  Above was discussed with patient and brother All the records are reviewed and case discussed with ED provider.   CODE STATUS: full  TOTAL TIME TAKING CARE OF THIS PATIENT: **55* minutes.    Enedina Finner M.D on 05/20/2019 at 12:31 PM  Between 7am to 6pm - Pager - [phone]  After 6pm go to [email or web address] - password [email or web address]  [facility]  Office  [phone]  CC: Primary care physician; Zachery Dauer, FNP

## 2019-05-21 ENCOUNTER — Encounter: Payer: Self-pay | Admitting: *Deleted

## 2019-05-21 ENCOUNTER — Encounter
Admission: EM | Disposition: A | Discharge status: Home / Self Care | Payer: Self-pay | Source: Home / Self Care | Attending: Internal Medicine

## 2019-05-21 HISTORY — PX: LEFT HEART CATH AND CORONARY ANGIOGRAPHY: CATH118249

## 2019-05-21 LAB — CBC
HCT: 44.2 % (ref 39.0–52.0)
Hemoglobin: 14.1 g/dL (ref 13.0–17.0)
MCH: 27 pg (ref 26.0–34.0)
MCHC: 31.9 g/dL (ref 30.0–36.0)
MCV: 84.7 fL (ref 80.0–100.0)
Platelets: 263 10*3/uL (ref 150–400)
RBC: 5.22 MIL/uL (ref 4.22–5.81)
RDW: 13.9 % (ref 11.5–15.5)
WBC: 14.5 10*3/uL — ABNORMAL HIGH (ref 4.0–10.5)
nRBC: 0 % (ref 0.0–0.2)

## 2019-05-21 LAB — GLUCOSE, CAPILLARY
Glucose-Capillary: 241 mg/dL — ABNORMAL HIGH (ref 70–99)
Glucose-Capillary: 220 mg/dL — ABNORMAL HIGH (ref 70–99)
Glucose-Capillary: 229 mg/dL — ABNORMAL HIGH (ref 70–99)
Glucose-Capillary: 267 mg/dL — ABNORMAL HIGH (ref 70–99)

## 2019-05-21 LAB — HEPARIN LEVEL (UNFRACTIONATED)
Heparin Unfractionated: <0.1 IU/mL — ABNORMAL LOW (ref 0.30–0.70)
Heparin Unfractionated: 0.28 IU/mL — ABNORMAL LOW (ref 0.30–0.70)

## 2019-05-21 SURGERY — LEFT HEART CATH AND CORONARY ANGIOGRAPHY
Anesthesia: Moderate Sedation

## 2019-05-21 MED ORDER — ONDANSETRON HCL 4 MG/2ML IJ SOLN: 4.0000 mg | Freq: Four times a day (QID) | INTRAMUSCULAR | Status: DC | PRN

## 2019-05-21 MED ORDER — LABETALOL HCL 5 MG/ML IV SOLN: 10.0000 mg | INTRAVENOUS | Status: AC | PRN

## 2019-05-21 MED ORDER — SODIUM CHLORIDE 0.9 % IV SOLN: 250.0000 mL | INTRAVENOUS | Status: DC | PRN

## 2019-05-21 MED ORDER — SODIUM CHLORIDE 0.9 % IV SOLN
250.0000 mL | INTRAVENOUS | Status: DC | PRN
  Administered 2019-05-21: 09:00:00 1000 mL via INTRAVENOUS

## 2019-05-21 MED ORDER — SODIUM CHLORIDE 0.9 % WEIGHT BASED INFUSION
1.0000 mL/kg/h | INTRAVENOUS | Status: AC
  Administered 2019-05-21: 12:00:00 1 mL/kg/h via INTRAVENOUS

## 2019-05-21 MED ORDER — MIDAZOLAM HCL 2 MG/2ML IJ SOLN
INTRAMUSCULAR | Status: DC | PRN
  Administered 2019-05-21: 1 mg via INTRAVENOUS

## 2019-05-21 MED ORDER — SODIUM CHLORIDE 0.9 % WEIGHT BASED INFUSION: 3.0000 mL/kg/h | INTRAVENOUS | Status: AC

## 2019-05-21 MED ORDER — HEPARIN (PORCINE) IN NACL 1000-0.9 UT/500ML-% IV SOLN
INTRAVENOUS | Status: AC
  Filled 2019-05-21: qty 1000

## 2019-05-21 MED ORDER — HEPARIN SODIUM (PORCINE) 1000 UNIT/ML IJ SOLN
INTRAMUSCULAR | Status: DC | PRN
  Administered 2019-05-21: 5000 [IU] via INTRAVENOUS

## 2019-05-21 MED ORDER — MIDAZOLAM HCL 2 MG/2ML IJ SOLN
INTRAMUSCULAR | Status: AC
  Filled 2019-05-21: qty 2

## 2019-05-21 MED ORDER — INSULIN ASPART 100 UNIT/ML ~~LOC~~ SOLN
0.0000 [IU] | Freq: Three times a day (TID) | SUBCUTANEOUS | Status: DC
  Administered 2019-05-21: 5 [IU] via SUBCUTANEOUS
  Administered 2019-05-22: 8 [IU] via SUBCUTANEOUS
  Administered 2019-05-22: 5 [IU] via SUBCUTANEOUS
  Filled 2019-05-21 (×3): qty 1

## 2019-05-21 MED ORDER — SODIUM CHLORIDE 0.9% FLUSH
3.0000 mL | Freq: Two times a day (BID) | INTRAVENOUS | Status: DC
  Administered 2019-05-21: 3 mL via INTRAVENOUS

## 2019-05-21 MED ORDER — CLOPIDOGREL BISULFATE 75 MG PO TABS
75.0000 mg | ORAL_TABLET | Freq: Every day | ORAL | Status: DC
  Administered 2019-05-21 – 2019-05-22 (×2): 75 mg via ORAL
  Filled 2019-05-21 (×2): qty 1

## 2019-05-21 MED ORDER — INSULIN GLARGINE 100 UNIT/ML ~~LOC~~ SOLN
22.0000 [IU] | Freq: Every day | SUBCUTANEOUS | Status: DC
  Administered 2019-05-21: 22 [IU] via SUBCUTANEOUS
  Filled 2019-05-21: qty 0.22

## 2019-05-21 MED ORDER — SODIUM CHLORIDE 0.9% FLUSH: 3.0000 mL | INTRAVENOUS | Status: DC | PRN

## 2019-05-21 MED ORDER — ASPIRIN 81 MG PO CHEW
CHEWABLE_TABLET | ORAL | Status: AC
  Filled 2019-05-21: qty 1

## 2019-05-21 MED ORDER — HEPARIN (PORCINE) IN NACL 1000-0.9 UT/500ML-% IV SOLN
INTRAVENOUS | Status: DC | PRN
  Administered 2019-05-21: 500 mL

## 2019-05-21 MED ORDER — VERAPAMIL HCL 2.5 MG/ML IV SOLN
INTRAVENOUS | Status: DC | PRN
  Administered 2019-05-21: 2.5 mg via INTRA_ARTERIAL

## 2019-05-21 MED ORDER — ASPIRIN 81 MG PO CHEW
81.0000 mg | CHEWABLE_TABLET | ORAL | Status: AC
  Administered 2019-05-21: 81 mg via ORAL

## 2019-05-21 MED ORDER — SODIUM CHLORIDE 0.9 % WEIGHT BASED INFUSION: 1.0000 mL/kg/h | INTRAVENOUS | Status: DC

## 2019-05-21 MED ORDER — ACETAMINOPHEN 325 MG PO TABS: 650.0000 mg | ORAL_TABLET | ORAL | Status: DC | PRN

## 2019-05-21 MED ORDER — FENTANYL CITRATE (PF) 100 MCG/2ML IJ SOLN
INTRAMUSCULAR | Status: DC | PRN
  Administered 2019-05-21: 25 ug via INTRAVENOUS

## 2019-05-21 MED ORDER — FENTANYL CITRATE (PF) 100 MCG/2ML IJ SOLN
INTRAMUSCULAR | Status: AC
  Filled 2019-05-21: qty 2

## 2019-05-21 MED ORDER — HEPARIN BOLUS VIA INFUSION
3500.0000 [IU] | Freq: Once | INTRAVENOUS | Status: AC
  Administered 2019-05-21: 3500 [IU] via INTRAVENOUS
  Filled 2019-05-21: qty 3500

## 2019-05-21 MED ORDER — IOHEXOL 300 MG/ML  SOLN
INTRAMUSCULAR | Status: DC | PRN
  Administered 2019-05-21: 180 mL

## 2019-05-21 MED ORDER — HYDRALAZINE HCL 20 MG/ML IJ SOLN: 10.0000 mg | INTRAMUSCULAR | Status: AC | PRN

## 2019-05-21 MED ORDER — ENOXAPARIN SODIUM 40 MG/0.4ML ~~LOC~~ SOLN
40.0000 mg | Freq: Two times a day (BID) | SUBCUTANEOUS | Status: DC
  Administered 2019-05-21 – 2019-05-22 (×2): 40 mg via SUBCUTANEOUS
  Filled 2019-05-21 (×2): qty 0.4

## 2019-05-21 MED ORDER — HEPARIN SODIUM (PORCINE) 1000 UNIT/ML IJ SOLN
INTRAMUSCULAR | Status: AC
  Filled 2019-05-21: qty 1

## 2019-05-21 MED ORDER — VERAPAMIL HCL 2.5 MG/ML IV SOLN
INTRAVENOUS | Status: AC
  Filled 2019-05-21: qty 2

## 2019-05-21 SURGICAL SUPPLY — 12 items
CATH 5F 110X4 TIG (CATHETERS) ×3 IMPLANT
CATH INFINITI 5FR JK (CATHETERS) ×3 IMPLANT
CATH INFINITI 5FR JL4 (CATHETERS) ×3 IMPLANT
CATH INFINITI JR4 5F (CATHETERS) ×3 IMPLANT
CATH LAUNCHER 6FR JL3.5 (CATHETERS) ×3 IMPLANT
CATH LAUNCHER 6FR JL4 (CATHETERS) ×3 IMPLANT
CATH VISTA GUIDE 6FR XB3.5 (CATHETERS) ×3 IMPLANT
DEVICE RAD COMP TR BAND LRG (VASCULAR PRODUCTS) ×3 IMPLANT
GLIDESHEATH SLEND SS 6F .021 (SHEATH) ×3 IMPLANT
KIT MANI 3VAL PERCEP (MISCELLANEOUS) ×3 IMPLANT
PACK CARDIAC CATH (CUSTOM PROCEDURE TRAY) ×3 IMPLANT
WIRE ROSEN-J .035X260CM (WIRE) ×6 IMPLANT

## 2019-05-21 NOTE — Progress Notes (Signed)
Dr. Saralyn Pilar in at bedside to speak with pt. And his son [place]i re: cath results. Both verbalize understanding of conversation. Right wrist clean, dry, intact at present with TR band. No complications at present. No cardiac or subjective c/o. Pt. Set up to eat now.

## 2019-05-21 NOTE — Consult Note (Signed)
[place] for Heparin dosing and monitoring  Indication: chest pain/ACS  No Known Allergies  Patient Measurements: Height: 5' 10.5" (179.1 cm) Weight: (!) 354 lb 15.1 oz (161 kg) IBW/kg (Calculated) : 74.15 Heparin Dosing Weight: 112 kg  Vital Signs: Temp: 98.3 F (36.8 C) (09/29 0832) Temp Source: Oral (09/29 0832) BP: 124/66 (09/29 0832) Pulse Rate: 77 (09/29 0832)  Labs: Recent Labs    05/20/19 0430 05/20/19 0629 05/20/19 0736 05/20/19 1111 05/20/19 1401 05/20/19 2202 05/21/19 0414 05/21/19 0736  HGB 14.6  --   --   --   --   --  14.1  --   HCT 45.8  --   --   --   --   --  44.2  --   PLT 261  --   --   --   --   --  263  --   APTT  --   --  23*  --   --   --   --   --   LABPROT  --   --  12.3  --   --   --   --   --   INR  --   --  0.9  --   --   --   --   --   HEPARINUNFRC  --   --   --   --  <0.10* <0.10*  --  0.28*  CREATININE 0.65  --   --   --   --   --   --   --   TROPONINIHS 16 150*  --  4,100*  --   --   --   --     Estimated Creatinine Clearance: 175.8 mL/min (by C-G formula based on SCr of 0.65 mg/dL).   Assessment: Pharmacy consulted for heparin infusion dosing and monitoring for 48 yo male for ACS/STEMI. No reported anticoagulants PTA.   Troponin (HS): 16 > 150 > 4100  Heparin DW: 112.5 kg  Heparin Course: Initiated 9/28 @ 0751 Gave 4000 units bolus and then 1350 units/hr HL returned < 0.10 - verified no interruptions with nurse Caryl Pina Bolus 3500 units and increase drip rate to 1700 units/hr HL returned < 0.10 - verified no problems w/ infusion Bolus 3500 units and increase drip rate to 2100 units/hr  Goal of Therapy:  Heparin level 0.3-0.7 units/ml Monitor platelets by anticoagulation protocol: Yes   Plan:  Heparin level 9/29 at 0736 = 0.28. Called RN who confirms heparin running at 21 ml/hr. Per RN, patient going to cath lab in next 10 minutes asking to hold off adjusting heparin drip. Will follow  up after cath.  CBC in AM. Continue to monitor H&H and platelets  Pharmacy will continue to follow.   Rocky Morel, PharmD Clinical Pharmacist 05/21/2019 8:34 AM

## 2019-05-21 NOTE — Progress Notes (Signed)
[place] at [place] NAME: Luis [name]    MR#:  [id number]  DATE OF BIRTH:  [birth year]  SUBJECTIVE:  CHIEF COMPLAINT:   Chief Complaint  Patient presents with  . Chest Pain  feels much better, no chest pain s/p cath REVIEW OF SYSTEMS:  Review of Systems  Constitutional: Negative for diaphoresis, fever, malaise/fatigue and weight loss.  HENT: Negative for ear discharge, ear pain, hearing loss, nosebleeds, sore throat and tinnitus.   Eyes: Negative for blurred vision and pain.  Respiratory: Negative for cough, hemoptysis, shortness of breath and wheezing.   Cardiovascular: Negative for chest pain, palpitations, orthopnea and leg swelling.  Gastrointestinal: Negative for abdominal pain, blood in stool, constipation, diarrhea, heartburn, nausea and vomiting.  Genitourinary: Negative for dysuria, frequency and urgency.  Musculoskeletal: Negative for back pain and myalgias.  Skin: Negative for itching and rash.  Neurological: Negative for dizziness, tingling, tremors, focal weakness, seizures, weakness and headaches.  Psychiatric/Behavioral: Negative for depression. The patient is not nervous/anxious.     DRUG ALLERGIES:  No Known Allergies VITALS:  Blood pressure 96/61, pulse 75, temperature 98.4 F (36.9 C), resp. rate 18, height 5' 10.5" (1.791 m), weight (!) 161 kg, SpO2 98 %. PHYSICAL EXAMINATION:  Physical Exam HENT:     Head: Normocephalic and atraumatic.  Eyes:     Conjunctiva/sclera: Conjunctivae normal.     Pupils: Pupils are equal, round, and reactive to light.  Neck:     Musculoskeletal: Normal range of motion and neck supple.     Thyroid: No thyromegaly.     Trachea: No tracheal deviation.  Cardiovascular:     Rate and Rhythm: Normal rate and regular rhythm.     Heart sounds: Normal heart sounds.  Pulmonary:     Effort: Pulmonary effort is normal. No respiratory distress.     Breath sounds: Normal breath sounds. No  wheezing.  Chest:     Chest wall: No tenderness.  Abdominal:     General: Bowel sounds are normal. There is no distension.     Palpations: Abdomen is soft.     Tenderness: There is no abdominal tenderness.  Musculoskeletal: Normal range of motion.  Skin:    General: Skin is warm and dry.     Findings: No rash.  Neurological:     Mental Status: He is alert and oriented to person, place, and time.     Cranial Nerves: No cranial nerve deficit.    LABORATORY PANEL:  Male CBC Recent Labs  Lab 05/21/19 0414  WBC 14.5*  HGB 14.1  HCT 44.2  PLT 263   ------------------------------------------------------------------------------------------------------------------ Chemistries  Recent Labs  Lab 05/20/19 0430  NA 137  K 4.0  CL 95*  CO2 30  GLUCOSE 310*  BUN 12  CREATININE 0.65  CALCIUM 9.0   RADIOLOGY:  No results found. ASSESSMENT AND PLAN:  Luis [name]  is a 48 y.o. 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 Acute coronary syndrome/NSTEMI -multiple risk factor with obesity, hyperlipidemia, uncontrolled diabetes, hypertension and family history of CAD - aspirin, beta blockers -s/p Cardiac cath - Occluded small caliber mid left circumflex, with patent large caliber ramus intermedius branch & 75% stenosis proximal RCA, with ectasia - Cardio recommends Medical therapy - Dual antiplatelet therapy (add plavix, continue asa) - Aggressive risk factor modification  2. Uncontrolled type II  diabetes with dietary noncompliance -A1c 11.3 -continue sliding scale and Lantus. Diabetes coordinator to see -hold metformin due to cardiac cath. Can resume at D/C  3. Hypertension -continue home meds -lisinopril and hydrochlorothiazide  beta-blockers  4. Peripheral neuropathy due to diabetes -continue gabapentin  5. Chronic  back pain -on methadone--- dose was verified by patient's pain clinic     All the records are reviewed and case discussed with Care Management/Social Worker. Management plans discussed with the patient, nursing and they are in agreement.  CODE STATUS: Full Code  TOTAL TIME TAKING CARE OF THIS PATIENT: 35 minutes.   More than 50% of the time was spent in counseling/coordination of care: YES  POSSIBLE D/C IN 1 DAYS, DEPENDING ON CLINICAL CONDITION.   Delfino Lovett M.D on 05/21/2019 at 5:47 PM  Between 7am to 6pm - Pager - [phone]  After 6pm go to [email or web address] - Social research officer, government  [facility]  Office  [phone]  CC: Primary care physician; Zachery Dauer, FNP  Note: This dictation was prepared with Dragon dictation along with smaller phrase technology. Any transcriptional errors that result from this process are unintentional.

## 2019-05-21 NOTE — Progress Notes (Addendum)
Inpatient Diabetes Program Recommendations  AACE/ADA: New Consensus Statement on Inpatient Glycemic Control (2015)  Target Ranges:  Prepandial:   less than 140 mg/dL      Peak postprandial:   less than 180 mg/dL (1-2 hours)      Critically ill patients:  140 - 180 mg/dL   Results for Luis [name], Luis [name] (MRN [id number]) as of 05/21/2019 09:58  Ref. Range 05/20/2019 11:39 05/20/2019 16:31 05/20/2019 21:45  Glucose-Capillary Latest Ref Range: 70 - 99 mg/dL 319 (H)  7 units NOVOLOG  252 (H)  5 units NOVOLOG  258 (H)  3 units NOVOLOG +  18 units LANTUS   Results for Luis [name], Luis [name] (MRN [id number]) as of 05/21/2019 09:58  Ref. Range 05/21/2019 08:30  Glucose-Capillary Latest Ref Range: 70 - 99 mg/dL 241 (H)  3 units NOVOLOG      To ED with CP/ Abnormal Troponin  History: DM   Home DM Meds: Lantus 18 units QHS                             Amaryl 4 mg Daily                             Metformin 1000 mg BID   Current Orders: Lantus 18 units QHS                             Novolog Sensitive Correction Scale/ SSI (0-9 units) TID AC + HS      Last A1c was 11.2% on 09/17--Seen by PCP Zara Council) at the [facility] and Insulin was adjusted.  DM Coordinator met with pt yesterday (09/28) to discuss elevated A1c and home DM care regimen.  NPO for Cardiac Cath today.     MD- Please consider the following in-hospital insulin adjustments:  1. Increase Lantus to 22 units QHS (20% increase)  2. Increase Novolog SSi to the Moderate scale (0-15 units) TID AC + HS     --Will follow patient during hospitalization--  Wyn Quaker RN, MSN, CDE Diabetes Coordinator Inpatient Glycemic Control Team Team Pager: [phone] (8a-5p)

## 2019-05-21 NOTE — Plan of Care (Signed)
  Problem: Activity: Goal: Risk for activity intolerance will decrease Outcome: Progressing Note: Independent in room , tolerating well   Problem: Nutrition: Goal: Adequate nutrition will be maintained Outcome: Progressing   Problem: Coping: Goal: Level of anxiety will decrease Outcome: Progressing   Problem: Pain Managment: Goal: General experience of comfort will improve Outcome: Progressing Note: Complaints of headache once, treated with tylenol    Problem: Safety: Goal: Ability to remain free from injury will improve Outcome: Progressing   Problem: Skin Integrity: Goal: Risk for impaired skin integrity will decrease Outcome: Progressing

## 2019-05-21 NOTE — Consult Note (Signed)
[facility] for Heparin dosing and monitoring  Indication: chest pain/ACS  No Known Allergies  Patient Measurements: Height: 5' 10.5" (179.1 cm) Weight: (!) 350 lb (158.8 kg) IBW/kg (Calculated) : 74.15 Heparin Dosing Weight: 112 kg  Vital Signs: Temp: 98.1 F (36.7 C) (09/28 1942) Temp Source: Oral (09/28 1942) BP: 124/76 (09/28 1942) Pulse Rate: 82 (09/28 1942)  Labs: Recent Labs    05/20/19 0430 05/20/19 0629 05/20/19 0736 05/20/19 1111 05/20/19 1401 05/20/19 2202  HGB 14.6  --   --   --   --   --   HCT 45.8  --   --   --   --   --   PLT 261  --   --   --   --   --   APTT  --   --  23*  --   --   --   LABPROT  --   --  12.3  --   --   --   INR  --   --  0.9  --   --   --   HEPARINUNFRC  --   --   --   --  <0.10* <0.10*  CREATININE 0.65  --   --   --   --   --   TROPONINIHS 16 150*  --  4,100*  --   --     Estimated Creatinine Clearance: 174.4 mL/min (by C-G formula based on SCr of 0.65 mg/dL).   Medical History: Past Medical History:  Diagnosis Date  . Back pain   . Diabetes mellitus without complication ([place])   . High cholesterol   . Hypertension   . Neuropathy     Assessment: Pharmacy consulted for heparin infusion dosing and monitoring for 48 yo male for ACS/STEMI. No reported anticoagulants PTA.   Troponin (HS): 16 --> 150   Heparin DW: 112 kg  Initiated 9/28 @ 0751 Gave 4000 units bolus and then 1350 units/hr  Goal of Therapy:  Heparin level 0.3-0.7 units/ml Monitor platelets by anticoagulation protocol: Yes   Plan:  HL returned < 0.10 - verified no interruptions with nurse Caryl Pina HL returned < 0.10 - verified no problems w/ infusion  Will bolus 3500 units and increase drip rate to 2100 units/hr  Will check anti-Xa level in 6 hours and daily while on heparin Continue to monitor H&H and platelets  Ena Dawley, PharmD Clinical Pharmacist 05/21/2019 12:48 AM

## 2019-05-22 LAB — BASIC METABOLIC PANEL
Anion gap: 12 (ref 5–15)
BUN: 14 mg/dL (ref 6–20)
CO2: 27 mmol/L (ref 22–32)
Calcium: 8.7 mg/dL — ABNORMAL LOW (ref 8.9–10.3)
Chloride: 96 mmol/L — ABNORMAL LOW (ref 98–111)
Creatinine, Ser: 0.71 mg/dL (ref 0.61–1.24)
GFR calc Af Amer: >60 mL/min (ref >60)
GFR calc non Af Amer: >60 mL/min (ref >60)
Glucose, Bld: 249 mg/dL — ABNORMAL HIGH (ref 70–99)
Potassium: 4.5 mmol/L (ref 3.5–5.1)
Sodium: 135 mmol/L (ref 135–145)

## 2019-05-22 LAB — CBC
HCT: 43.7 % (ref 39.0–52.0)
Hemoglobin: 13.9 g/dL (ref 13.0–17.0)
MCH: 26.9 pg (ref 26.0–34.0)
MCHC: 31.8 g/dL (ref 30.0–36.0)
MCV: 84.7 fL (ref 80.0–100.0)
Platelets: 252 10*3/uL (ref 150–400)
RBC: 5.16 MIL/uL (ref 4.22–5.81)
RDW: 14.2 % (ref 11.5–15.5)
WBC: 13.4 10*3/uL — ABNORMAL HIGH (ref 4.0–10.5)
nRBC: 0 % (ref 0.0–0.2)

## 2019-05-22 LAB — GLUCOSE, CAPILLARY
Glucose-Capillary: 244 mg/dL — ABNORMAL HIGH (ref 70–99)
Glucose-Capillary: 272 mg/dL — ABNORMAL HIGH (ref 70–99)

## 2019-05-22 MED ORDER — INSULIN GLARGINE 100 UNIT/ML ~~LOC~~ SOLN
26.0000 [IU] | Freq: Every day | SUBCUTANEOUS | Status: DC
  Filled 2019-05-22: qty 0.26

## 2019-05-22 MED ORDER — GABAPENTIN 300 MG PO CAPS: 900.0000 mg | ORAL_CAPSULE | Freq: Two times a day (BID) | ORAL | 0 refills | Status: DC

## 2019-05-22 MED ORDER — INSULIN GLARGINE 100 UNIT/ML ~~LOC~~ SOLN: 26.0000 [IU] | Freq: Every day | SUBCUTANEOUS | 11 refills | Status: DC

## 2019-05-22 MED ORDER — GABAPENTIN 300 MG PO CAPS: 600.0000 mg | ORAL_CAPSULE | Freq: Every day | ORAL | 0 refills | Status: DC

## 2019-05-22 MED ORDER — GLIMEPIRIDE 4 MG PO TABS: 4.0000 mg | ORAL_TABLET | Freq: Every day | ORAL | 0 refills | Status: DC

## 2019-05-22 MED ORDER — EZETIMIBE 10 MG PO TABS: 10.0000 mg | ORAL_TABLET | Freq: Every day | ORAL | 1 refills | Status: DC

## 2019-05-22 MED ORDER — METOPROLOL TARTRATE 25 MG PO TABS: 25.0000 mg | ORAL_TABLET | Freq: Two times a day (BID) | ORAL | 1 refills | Status: DC

## 2019-05-22 MED ORDER — LISINOPRIL 20 MG PO TABS: 20.0000 mg | ORAL_TABLET | Freq: Every day | ORAL | 0 refills | Status: DC

## 2019-05-22 MED ORDER — METFORMIN HCL 500 MG PO TABS: 1000.0000 mg | ORAL_TABLET | Freq: Two times a day (BID) | ORAL | 1 refills | Status: DC

## 2019-05-22 MED ORDER — MELATONIN 5 MG PO TABS
5.0000 mg | ORAL_TABLET | Freq: Once | ORAL | Status: AC
  Administered 2019-05-22: 5 mg via ORAL
  Filled 2019-05-22: qty 1

## 2019-05-22 MED ORDER — ATORVASTATIN CALCIUM 40 MG PO TABS: 40.0000 mg | ORAL_TABLET | Freq: Every day | ORAL | 1 refills | Status: DC

## 2019-05-22 MED ORDER — CLOPIDOGREL BISULFATE 75 MG PO TABS: 75.0000 mg | ORAL_TABLET | Freq: Every day | ORAL | 1 refills | Status: DC

## 2019-05-22 MED ORDER — ASPIRIN 81 MG PO TBEC: 81.0000 mg | DELAYED_RELEASE_TABLET | Freq: Every day | ORAL | 0 refills | Status: DC

## 2019-05-22 NOTE — Discharge Summary (Signed)
[facility] - [place] at [facility]   PATIENT NAME: Luis [name]    MR#:  [id number]  DATE OF BIRTH:  Oct 20, 1970  DATE OF ADMISSION:  05/20/2019 ADMITTING PHYSICIAN: Enedina Finner, MD  DATE OF DISCHARGE: 05/22/2019  PRIMARY CARE PHYSICIAN: Zachery Dauer, FNP    ADMISSION DIAGNOSIS:  NSTEMI (non-ST elevated myocardial infarction) (HCC) [I21.4] Essential hypertension [I10] Controlled type 2 diabetes mellitus with complication, with long-term current use of insulin (HCC) [E11.8, Z79.4] Chest pain, unspecified type [R07.9]  DISCHARGE DIAGNOSIS:  Acute NSTEMI Uncontrolled DM HTN Obesity  SECONDARY DIAGNOSIS:   Past Medical History:  Diagnosis Date  . Back pain   . Diabetes mellitus without complication (HCC)   . High cholesterol   . Hypertension   . 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 Diabetes coordinator to see -resume metformin  after 48 hours due to cardiac cath.  -cont glimipride  3.Hypertension -continue home meds -lisinopril and hydrochlorothiazide and beta-blockers  4.Peripheral neuropathy due to diabetes -continue gabapentin  5.Chronic back pain -on  methadone---dose was verified by patient's pain clinic  Overall doing well. Patient will discharged to home. Discussed with care manager. Patient will get all his meds from medication management clinic. He will follow-up with Dr. Lady Gary and his nurse practitioner. CONSULTS OBTAINED:  Treatment Team:  Dalia Heading, MD  DRUG ALLERGIES:  No Known Allergies  DISCHARGE MEDICATIONS:   Allergies as of 05/22/2019   No Known Allergies     Medication List    TAKE these medications   aspirin 81 MG EC tablet Take 1 tablet (81 mg total) by mouth daily. Start taking on: May 23, 2019   atorvastatin 40 MG tablet Commonly known as: LIPITOR Take 1 tablet (40 mg total) by mouth daily.   clopidogrel 75 MG tablet Commonly known as: PLAVIX Take 1 tablet (75 mg total) by mouth daily. Start taking on: May 23, 2019   ezetimibe 10 MG tablet Commonly known as: ZETIA Take 1 tablet (10 mg total) by mouth daily.   gabapentin 300 MG capsule Commonly known as: NEURONTIN Take 3 capsules (900 mg total) by mouth 2 (two) times daily. What changed: You were already taking a medication with the same name, and this prescription was added. Make sure you understand how and when to take each.   gabapentin 300 MG capsule Commonly known as: NEURONTIN Take 2 capsules (600 mg total) by mouth daily. Start taking on: May 23, 2019 What changed: See the new instructions.   glimepiride 4 MG tablet Commonly known as: AMARYL Take 1 tablet (4 mg total) by mouth daily with breakfast. What changed: See the new instructions.   hydrochlorothiazide 12.5 MG capsule Commonly known as: MICROZIDE TAKE ONE CAPSULE BY MOUTH EVERY DAY   ibuprofen 200 MG tablet Commonly known as: ADVIL Take 200-400 mg by mouth every 6 (six) hours as  needed for fever or mild pain.   insulin glargine 100 UNIT/ML injection Commonly known as: Lantus Inject 0.26 mLs (26 Units total) into the skin at bedtime. What changed: how much  to take   lisinopril 20 MG tablet Commonly known as: ZESTRIL Take 1 tablet (20 mg total) by mouth daily.   metFORMIN 500 MG tablet Commonly known as: GLUCOPHAGE Take 2 tablets (1,000 mg total) by mouth 2 (two) times daily. Start taking on: May 23, 2019   methadone 10 MG/5ML solution Commonly known as: DOLOPHINE Take 140 mg by mouth daily.   metoprolol tartrate 25 MG tablet Commonly known as: LOPRESSOR Take 1 tablet (25 mg total) by mouth 2 (two) times daily.       If you experience worsening of your admission symptoms, develop shortness of breath, life threatening emergency, suicidal or homicidal thoughts you must seek medical attention immediately by calling 911 or calling your MD immediately  if symptoms less severe.  You Must read complete instructions/literature along with all the possible adverse reactions/side effects for all the Medicines you take and that have been prescribed to you. Take any new Medicines after you have completely understood and accept all the possible adverse reactions/side effects.   Please note  You were cared for by a hospitalist during your hospital stay. If you have any questions about your discharge medications or the care you received while you were in the hospital after you are discharged, you can call the unit and asked to speak with the hospitalist on call if the hospitalist that took care of you is not available. Once you are discharged, your primary care physician will handle any further medical issues. Please note that NO REFILLS for any discharge medications will be authorized once you are discharged, as it is imperative that you return to your primary care physician (or establish a relationship with a primary care physician if you do not have one) for your aftercare needs so that they can reassess your need for medications and monitor your lab values. Today   SUBJECTIVE   No new complaints.  VITAL SIGNS:  Blood pressure (!) 137/92,  pulse 74, temperature 98.4 F (36.9 C), resp. rate 20, height 5' 10.5" (1.791 m), weight (!) 161 kg, SpO2 99 %.  I/O:    Intake/Output Summary (Last 24 hours) at 05/22/2019 1101 Last data filed at 05/22/2019 1014 Gross per 24 hour  Intake 1006.29 ml  Output 1750 ml  Net -743.71 ml    PHYSICAL EXAMINATION:  GENERAL:  48 y.o.-year-old patient lying in the bed with no acute distress.Obese EYES: Pupils equal, round, reactive to light and accommodation. No scleral icterus. Extraocular muscles intact.  HEENT: Head atraumatic, normocephalic. Oropharynx and nasopharynx clear.  NECK:  Supple, no jugular venous distention. No thyroid enlargement, no tenderness.  LUNGS: Normal breath sounds bilaterally, no wheezing, rales,rhonchi or crepitation. No use of accessory muscles of respiration.  CARDIOVASCULAR: S1, S2 normal. No murmurs, rubs, or gallops.  ABDOMEN: Soft, non-tender, non-distended. Bowel sounds present. No organomegaly or mass.  EXTREMITIES: No pedal edema, cyanosis, or clubbing.  NEUROLOGIC: Cranial nerves II through XII are intact. Muscle strength 5/5 in all extremities. Sensation intact. Gait not checked.  PSYCHIATRIC: patient is alert and oriented x 3.  SKIN: No obvious rash, lesion, or ulcer.   DATA REVIEW:   CBC  Recent Labs  Lab 05/22/19 0558  WBC 13.4*  HGB 13.9  HCT 43.7  PLT 252    Chemistries  Recent Labs  Lab  05/22/19 0558  NA 135  K 4.5  CL 96*  CO2 27  GLUCOSE 249*  BUN 14  CREATININE 0.71  CALCIUM 8.7*    Microbiology Results   Recent Results (from the past 240 hour(s))  SARS Coronavirus 2 [facility](Hospital order, Performed in [facility] lab) Nasopharyngeal Nasopharyngeal Swab     Status: None   Collection Time: 05/20/19  7:36 AM   Specimen: Nasopharyngeal Swab  Result Value Ref Range Status   SARS Coronavirus 2 NEGATIVE NEGATIVE Final    Comment: (NOTE) If result is NEGATIVE SARS-CoV-2 target nucleic acids are NOT DETECTED. The SARS-CoV-2  RNA is generally detectable in upper and lower  respiratory specimens during the acute phase of infection. The lowest  concentration of SARS-CoV-2 viral copies this assay can detect is 250  copies / mL. A negative result does not preclude SARS-CoV-2 infection  and should not be used as the sole basis for treatment or other  patient management decisions.  A negative result may occur with  improper specimen collection / handling, submission of specimen other  than nasopharyngeal swab, presence of viral mutation(s) within the  areas targeted by this assay, and inadequate number of viral copies  (<250 copies / mL). A negative result must be combined with clinical  observations, patient history, and epidemiological information. If result is POSITIVE SARS-CoV-2 target nucleic acids are DETECTED. The SARS-CoV-2 RNA is generally detectable in upper and lower  respiratory specimens dur ing the acute phase of infection.  Positive  results are indicative of active infection with SARS-CoV-2.  Clinical  correlation with patient history and other diagnostic information is  necessary to determine patient infection status.  Positive results do  not rule out bacterial infection or co-infection with other viruses. If result is PRESUMPTIVE POSTIVE SARS-CoV-2 nucleic acids MAY BE PRESENT.   A presumptive positive result was obtained on the submitted specimen  and confirmed on repeat testing.  While 2019 novel coronavirus  (SARS-CoV-2) nucleic acids may be present in the submitted sample  additional confirmatory testing may be necessary for epidemiological  and / or clinical management purposes  to differentiate between  SARS-CoV-2 and other Sarbecovirus currently known to infect humans.  If clinically indicated additional testing with an alternate test  methodology [phone] is advised. The SARS-CoV-2 RNA is generally  detectable in upper and lower respiratory sp ecimens during the acute  phase of  infection. The expected result is Negative. Fact Sheet for Patients:  BoilerBrush.com.cyhttps://www.fda.gov/media/136312/download Fact Sheet for Healthcare Providers: https://pope.com/https://www.fda.gov/media/136313/download This test is not yet approved or cleared by the [place] FDA and has been authorized for detection and/or diagnosis of SARS-CoV-2 by FDA under an Emergency Use Authorization (EUA).  This EUA will remain in effect (meaning this test can be used) for the duration of the COVID-19 declaration under Section 564(b)(1) of the Act, 21 U.S.C. section 360bbb-3(b)(1), unless the authorization is terminated or revoked sooner. Performed at [facility], [address] [place], [place] [address]     RADIOLOGY:  No results found.   CODE STATUS:     Code Status Orders  (From admission, onward)         Start     Ordered   05/20/19 0946  Full code  Continuous     05/20/19 0945        Code Status History    This patient has a current code status but no historical code status.   Advance Care Planning Activity  TOTAL TIME TAKING CARE OF THIS PATIENT: *40* minutes.    Enedina Finner M.D on 05/22/2019 at 11:01 AM  Between 7am to 6pm - Pager - [phone] After 6pm go to [email or web address] - Social research officer, government  [facility]  Office  [phone]  CC: Primary care physician; Zachery Dauer, FNP

## 2019-05-22 NOTE — TOC Transition Note (Addendum)
Transition of Care [facility]) - CM/SW Discharge Note   Patient Details  Name: Luis [name] MRN: [id number] Date of Birth: 04/21/1971  [facility] CM/SW Contact:  Elza Rafter, RN Phone Number: 05/22/2019, 10:55 AM   Clinical Narrative:  Patient is from home with mother and son.  Independent in all ADL's.  Current with [facility] for PCP; [facility]  Will fax all prescriptions to [facility] and patient can pick up at discharge.  No further needs identified at this time by the [facility] team.    @1200 -Faxed all scripts to [facility]  He can pick up medications after 2pm-Ash[place] RN aware  Final next level of care: Home/Self Care Barriers to Discharge: No Barriers Identified   Patient Goals and CMS Choice        Discharge Placement                       Discharge Plan and Services   Discharge Planning Services: CM Consult                                 Social Determinants of Health (SDOH) Interventions     Readmission Risk Interventions No flowsheet data found.

## 2019-05-22 NOTE — Progress Notes (Signed)
Cardiovascular and Pulmonary Nurse Navigator Note:   48 year old male with known hx of uncontrolled DM II on insulin, peripheral neuropathy, HLD, HTN, severe obesity, and chronic back pain.  Patient ruled in for NSTEMI.  HS Troponin max at 4,100.    Isaias Cowman, MD (Primary)  Teodoro Spray, MD  Procedures  LEFT HEART CATH AND CORONARY ANGIOGRAPHY  Conclusion    Prox Cx lesion is 100% stenosed.  Mid LAD lesion is 30% stenosed.  Prox RCA lesion is 75% stenosed.   1.  Non-STEMI 2.  Occluded small caliber mid left circumflex, with patent large caliber ramus intermedius branch 3.  75% stenosis proximal RCA, with ectasia  Recommendations  1.  Medical therapy 2.  Aggressive risk factor modification 3.  Dual antiplatelet therapy    EDUCATION:   "Heart Attack Bouncing Back" booklet given and reviewed with patient. Discussed the definition of CAD. Reviewed the location of CAD.   ? Discussed modifiable risk factors including controlling blood pressure, cholesterol, and blood sugar; following heart healthy diet; maintaining healthy weight; exercise; and smoking cessation. ? Discussed cardiac medications including rationale for taking, mechanisms of action, and side effects. Stressed the importance of taking medications as prescribed. Patient on the following cardiac medications:  Aspirin, Plavix, Lipitor, Zetia, HCTZ, Lisinopril, and Metoprolol.  RN CM assisted patient with obtaining medications from [facility] here at [facility].   ? Discussed emergency plan for heart attack symptoms. Patient verbalized understanding of need to call 911 and not to drive himself to ER if having cardiac symptoms / chest pain.   ? Diet of low sodium, low fat, low cholesterol heart healthy / carb modified diet discussed. Information on diet provided.  ? Smoking Cessation - Patient is a CURRENT  every day smoker. Patient is reporting he currently smokes one pack per day.  Patient  used to smoke three packs per day.  While here in the hospital patient has been using the Nicotine patch and has not had any cravings.  Patient is being discharged with nicotine patches.   Discussed tapering down the number that he smokes per day and then switching brands to further taper.  Cleaning his environment and not bumming cigarettes from others also discussed.   Patient has been advised to quit by his cardiologist.  Patient reports smoking three packs per day.  "Thinking about Quitting - Yes You Can!" informational sheet reviewed with patient.  ? Exercise - Benefits of exercised discussed. Patient does not currently exercise.   Informed patient his cardiologist has referred him to outpatient Cardiac Rehab. An overview of the program was provided. Patient does not have any health insurance currently.  Patient has previously applied for Disability, but was denied.  Patient receives [facility] through [facility], but does not want to drive all the way to [facility].  Patient did state he and his mother wer given a form in the ER for financial assistance at [facility] that he needs to complete.  This RN then informed patient of the virtual Cardiac Rehab program the [facility] Cardiac Rehab dept offers. Information on this program provided.    Patient seems motivated and wants to live to see his grand-daughter grow up.  For now, he has given verbal consent to participate in the Virtual Cardiac Rehab.  He would like to be called on his mother's cell phone at [phone] in the afternoon around 2 p.m. if possible.  This RN informed patient the virtual calls  are made on Tuesdays.    Virtual Cardiac Rehab Verbal Consent:   In the setting of the current Covid19 crisis, you are scheduled to join our "At Home" [facility]  Cardiac or Pulmonary  Rehab program . Just as we do with many in-gym visits, in order for you to participate in this program, we must obtain  consent.  If you'd like, I can send  this to your mychart (if signed up) or email for you to  review.  Otherwise, I can obtain your verbal consent now.  By agreeing to a Cardiac  or Pulmonary Rehab Telehealth visit, we'd like you to understand that the technology  does not allow for your Cardiac or Pulmonary Rehab team member to perform a  physical assessment, and thus may limit their ability to fully assess your ability to  perform exercise programs. If your provider identifies any concerns that need to be  evaluated in person, we will make arrangements to do so.  Finally, though the  technology is pretty good, we cannot assure that it will always work on either your or  our end and we cannot ensure that we have a secure connection.  Cardiac and  Pulmonary Rehab Telehealth visits and "At Home" cardiac and pulmonary rehab are  provided at no cost to you. Are you willing to proceed?" STAFF: Did the patient verbally  acknowledge consent to telehealth visit?  YES  Patient appreciative of the above information.    Army Melia, RN, BSN, [facility]  [place]  [facility] Cardiac & Pulmonary Rehab  Cardiovascular & Pulmonary Nurse Navigator  Direct Line: [phone]  Department Phone #: [phone] Fax: [phone]  Email Address: [email or web address]

## 2019-05-22 NOTE — Plan of Care (Signed)
  Problem: Health Behavior/Discharge Planning: Goal: Ability to manage health-related needs will improve Outcome: Adequate for Discharge   Problem: Clinical Measurements: Goal: Ability to maintain clinical measurements within normal limits will improve Outcome: Adequate for Discharge Goal: Will remain free from infection Outcome: Adequate for Discharge Goal: Diagnostic test results will improve Outcome: Adequate for Discharge Goal: Cardiovascular complication will be avoided Outcome: Adequate for Discharge   Problem: Activity: Goal: Risk for activity intolerance will decrease Outcome: Adequate for Discharge   Problem: Pain Managment: Goal: General experience of comfort will improve Outcome: Adequate for Discharge   Problem: Safety: Goal: Ability to remain free from injury will improve Outcome: Adequate for Discharge   Problem: Education: Goal: Understanding of CV disease, CV risk reduction, and recovery process will improve Outcome: Adequate for Discharge Goal: Individualized Educational Video(s) Outcome: Adequate for Discharge   Problem: Skin Integrity: Goal: Risk for impaired skin integrity will decrease Outcome: Adequate for Discharge   Problem: Cardiovascular: Goal: Ability to achieve and maintain adequate cardiovascular perfusion will improve Outcome: Adequate for Discharge Goal: Vascular access site(s) Level 0-1 will be maintained Outcome: Adequate for Discharge   Problem: Health Behavior/Discharge Planning: Goal: Ability to safely manage health-related needs after discharge will improve Outcome: Adequate for Discharge

## 2019-05-22 NOTE — Progress Notes (Signed)
Patient discharged home with mother today, medication information and prescriptions proved at medication management clinic for him to pick up, patient educated on radial site care, heart healthy diet, and follow up appointments.

## 2019-05-22 NOTE — Progress Notes (Addendum)
[facility] Card[facility]  SUBJECTIVE: Luis [name] is a 48 year old male with a past medical history significant for uncontrolled type 2 diabetes, hyperlipidemia, tobacco abuse, and obesity who presented to the ED on 05/20/19 for an acute onset of chest pain/burning.  Workup in the ED was significant for high sensitivity troponin 16, 150, and 4100 respectively, ECG negative for ST elevation, and chest xray negative for acute cardiopulmonary disease.  Decision to proceed with further evaluation with cardiac catheterization was made.  Cardiac catheterization revealed an occluded proximal circumflex lesion, 100% stenosed, a mid LAD lesion, 30% stenosed, and a proximal RCA lesion, 75% stenosed.  Aggressive medical management recommended.   Vitals:   05/21/19 1646 05/21/19 1932 05/22/19 0344 05/22/19 0744  BP: 96/61 (!) 156/97 116/63 (!) 137/92  Pulse: 75 79 74 74  Resp: 18 20 20    Temp: 98.4 F (36.9 C) 99.5 F (37.5 C) 98.7 F (37.1 C) 98.4 F (36.9 C)  TempSrc:  Oral Oral   SpO2: 98% 96% 90% 99%  Weight:   (!) 161 kg   Height:         Intake/Output Summary (Last 24 hours) at 05/22/2019 0849 Last data filed at 05/22/2019 0348 Gross per 24 hour  Intake 766.29 ml  Output 1300 ml  Net -533.71 ml      PHYSICAL EXAM  General: Well developed, morbidly obese, in no acute distress HEENT:  Normocephalic and atramatic Neck:  No JVD.  Lungs: Clear bilaterally to auscultation and percussion. Heart: HRRR . Normal S1 and S2 without gallops or murmurs.  Abdomen: Bowel sounds are positive, abdomen soft and non-tender  Msk:  Back normal, normal gait. Normal strength and tone for age. Extremities: No clubbing, cyanosis or edema.   Neuro: Alert and oriented X 3. Psych:  Good affect, responds appropriately   LABS: Basic Metabolic Panel: Recent Labs    05/20/19 0430 05/22/19 0558  NA 137 135  K 4.0 4.5  CL 95* 96*  CO2 30 27  GLUCOSE 310* 249*  BUN 12 14  CREATININE 0.65 0.71  CALCIUM 9.0 8.7*    Liver Function Tests: No results for input(s): AST, ALT, ALKPHOS, BILITOT, PROT, ALBUMIN in the last 72 hours. No results for input(s): LIPASE, AMYLASE in the last 72 hours. CBC: Recent Labs    05/20/19 0430 05/21/19 0414 05/22/19 0558  WBC 12.5* 14.5* 13.4*  NEUTROABS 6.5  --   --   HGB 14.6 14.1 13.9  HCT 45.8 44.2 43.7  MCV 84.5 84.7 84.7  PLT 261 263 252   Cardiac Enzymes: No results for input(s): CKTOTAL, CKMB, CKMBINDEX, TROPONINI in the last 72 hours. BNP: Invalid input(s): POCBNP D-Dimer: No results for input(s): DDIMER in the last 72 hours. Hemoglobin A1C: No results for input(s): HGBA1C in the last 72 hours. Fasting Lipid Panel: No results for input(s): CHOL, HDL, LDLCALC, TRIG, CHOLHDL, LDLDIRECT in the last 72 hours. Thyroid Function Tests: No results for input(s): TSH, T4TOTAL, T3FREE, THYROIDAB in the last 72 hours.  Invalid input(s): FREET3 Anemia Panel: No results for input(s): VITAMINB12, FOLATE, FERRITIN, TIBC, IRON, RETICCTPCT in the last 72 hours.  No results found.   TELEMETRY: Normal sinus rhythm   ASSESSMENT AND PLAN:  Active Problems:   ACS (acute coronary syndrome) ([place])    1.  NSTEMI  -S/p cardiac catheterization revealing an occluded proximal circumflex lesion, 100% stenosed, a mid LAD lesion, 30% stenosed, and a proximal RCA lesion, 75% stenosed  -Medical management indicated; recommend to increase atorvastatin to  80mg  at d/c and continue Zetia 10mg  daily; will also d/c on aspirin 81mg  daily and Plavix 75mg  daily   -Had a long discussion with the patient regarding the importance of smoking cessation - will continue with the patch - as well as dietary modifications   -Encourage cardiac rehab   -Follow up with Dr. or , PA-C within 1 week of discharge   The history, physical exam findings, and plan of care were all discussed with Dr. , and all decision making was made in collaboration.    PA-C 05/22/2019 8:49 AM    Agree with above

## 2019-05-22 NOTE — Progress Notes (Signed)
Pt having trouble sleeping, states he takes melatonin at home when needed. Spoke with NP, Rufina Falco, she gave orders for 5mg  melatonin. Will give and continue to monitor. Conley Simmonds, RN, BSN

## 2019-05-22 NOTE — Plan of Care (Signed)
  Problem: Health Behavior/Discharge Planning: Goal: Ability to manage health-related needs will improve Outcome: Adequate for Discharge   Problem: Clinical Measurements: Goal: Ability to maintain clinical measurements within normal limits will improve Outcome: Adequate for Discharge Goal: Will remain free from infection Outcome: Adequate for Discharge Goal: Diagnostic test results will improve Outcome: Adequate for Discharge Goal: Cardiovascular complication will be avoided Outcome: Adequate for Discharge   Problem: Activity: Goal: Risk for activity intolerance will decrease Outcome: Adequate for Discharge   Problem: Elimination: Goal: Will not experience complications related to bowel motility Outcome: Adequate for Discharge Goal: Will not experience complications related to urinary retention Outcome: Adequate for Discharge   Problem: Pain Managment: Goal: General experience of comfort will improve Outcome: Adequate for Discharge   Problem: Safety: Goal: Ability to remain free from injury will improve Outcome: Adequate for Discharge   Problem: Skin Integrity: Goal: Risk for impaired skin integrity will decrease Outcome: Adequate for Discharge   Problem: Cardiovascular: Goal: Ability to achieve and maintain adequate cardiovascular perfusion will improve Outcome: Adequate for Discharge Goal: Vascular access site(s) Level 0-1 will be maintained Outcome: Adequate for Discharge   Problem: Activity: Goal: Ability to return to baseline activity level will improve Outcome: Adequate for Discharge   Problem: Health Behavior/Discharge Planning: Goal: Ability to safely manage health-related needs after discharge will improve Outcome: Adequate for Discharge   Problem: Health Behavior/Discharge Planning: Goal: Ability to safely manage health-related needs after discharge will improve Outcome: Adequate for Discharge

## 2019-05-22 NOTE — Plan of Care (Signed)
  Problem: Activity: Goal: Risk for activity intolerance will decrease Outcome: Progressing Note: Independent in room, tolerating well    Problem: Elimination: Goal: Will not experience complications related to urinary retention Outcome: Progressing   Problem: Pain Managment: Goal: General experience of comfort will improve Outcome: Progressing Note: No complaints of pain this shift   Problem: Safety: Goal: Ability to remain free from injury will improve Outcome: Progressing   Problem: Skin Integrity: Goal: Risk for impaired skin integrity will decrease Outcome: Progressing   Problem: Education: Goal: Knowledge of General Education information will improve Description: Including pain rating scale, medication(s)/side effects and non-pharmacologic comfort measures Outcome: Completed/Met   Problem: Clinical Measurements: Goal: Respiratory complications will improve Outcome: Completed/Met Note: On room air   Problem: Nutrition: Goal: Adequate nutrition will be maintained Outcome: Completed/Met   Problem: Coping: Goal: Level of anxiety will decrease Outcome: Completed/Met

## 2019-05-23 DIAGNOSIS — I219 Acute myocardial infarction, unspecified: Secondary | ICD-10-CM

## 2019-05-23 HISTORY — DX: Acute myocardial infarction, unspecified: I21.9

## 2019-05-28 ENCOUNTER — Encounter: Payer: Medicaid Other | Attending: Cardiology | Admitting: *Deleted

## 2019-05-28 ENCOUNTER — Other Ambulatory Visit: Payer: Self-pay

## 2019-05-28 DIAGNOSIS — E119 Type 2 diabetes mellitus without complications: Secondary | ICD-10-CM

## 2019-05-28 DIAGNOSIS — I214 Non-ST elevation (NSTEMI) myocardial infarction: Secondary | ICD-10-CM

## 2019-05-28 MED ORDER — GABAPENTIN 300 MG PO CAPS: ORAL_CAPSULE | ORAL | 0 refills | Status: DC

## 2019-05-28 NOTE — Progress Notes (Signed)
Pt unsure of virtual program.  He seemed hesitant about committing.  He wants to meet with Dr. Ubaldo Glassing first on Thursday 10/8 to discuss.  We will follow up again next week.

## 2019-06-10 ENCOUNTER — Ambulatory Visit: Payer: Medicaid Other | Admitting: Pharmacist

## 2019-06-10 ENCOUNTER — Other Ambulatory Visit: Payer: Self-pay

## 2019-06-10 DIAGNOSIS — Z79899 Other long term (current) drug therapy: Secondary | ICD-10-CM

## 2019-06-10 NOTE — Progress Notes (Signed)
  Medication Management Clinic Visit Note  Patient: Luis [name] MRN: [id number] Date of Birth: 07/15/1971 PCP: Boyce Medici, FNP   Marsa Aris 48 y.o. male presents for a medication therapy management visit today.  He was recently admitted to [facility] 04/2019 for myocardial infarction.  There were no vitals taken for this visit.  Patient Information   Past Medical History:  Diagnosis Date  . Back pain   . Diabetes mellitus without complication ([place])   . High cholesterol   . Hypertension   . Neuropathy       Past Surgical History:  Procedure Laterality Date  . KNEE SURGERY Right   . LEFT HEART CATH AND CORONARY ANGIOGRAPHY N/A 05/21/2019   Procedure: LEFT HEART CATH AND CORONARY ANGIOGRAPHY;  Surgeon: Isaias Cowman, MD;  Location: [facility];  Service: Cardiovascular;  Laterality: N/A;     Family History  Problem Relation Age of Onset  . Cancer Mother   . Heart disease Father   . Diabetes Paternal Grandfather       Lifestyle Diet:  Grilled chicken.  Avoiding fried foods, and lowering salt intake.            Social History   Substance and Sexual Activity  Alcohol Use Yes  . Alcohol/week: 3.0 standard drinks  . Types: 3 Cans of beer per week      Social History   Tobacco Use  Smoking Status Current Every Day Smoker  . Packs/day: 1.00  . Years: 30.00  . Pack years: 30.00  . Types: Cigarettes  Smokeless Tobacco Never Used      Health Maintenance  Topic Date Due  . FOOT EXAM  02/25/1982  . OPHTHALMOLOGY EXAM  02/25/1982  . HIV Screening  02/26/1987  . TETANUS/TDAP  02/26/1991  . HEMOGLOBIN A1C  11/06/2019  . INFLUENZA VACCINE  Completed  . PNEUMOCOCCAL POLYSACCHARIDE VACCINE AGE 94-64 HIGH RISK  Completed     Assessment and Plan:  Recent MI:  Clopidogrel, metoprolol tartrate, ASA.    BP:  Does not check BP regularly.  Lisinopril, HCTZ.  Patient reported dizziness after taking medications.  Advised patient to speak with  MD about lowering dosages of BP meds.  DM:  FBG in the AM 160s.  A1c 11.2.  Basaglar 26 units at bedtime, metformin, glimepiride.  No longer takes Trulicity due to stomach pains.  Neuropathy:  Gabepentin  HLD:  Making dietary changes, low fried intake, zetia.  Unsure if still taking atorvastatin as patient is not at home at time of phone call.  Last filled in February.  Pain:  Takes tylenol, no longer taking ibuprofen  Methadone:  ([facility])

## 2019-06-12 ENCOUNTER — Other Ambulatory Visit: Payer: Self-pay

## 2019-06-26 ENCOUNTER — Other Ambulatory Visit: Payer: Self-pay | Admitting: Gerontology

## 2019-06-26 DIAGNOSIS — E119 Type 2 diabetes mellitus without complications: Secondary | ICD-10-CM

## 2019-07-08 ENCOUNTER — Other Ambulatory Visit: Payer: Self-pay | Admitting: Gerontology

## 2019-07-08 DIAGNOSIS — E119 Type 2 diabetes mellitus without complications: Secondary | ICD-10-CM

## 2019-08-01 ENCOUNTER — Other Ambulatory Visit: Payer: Medicaid Other

## 2019-08-01 ENCOUNTER — Other Ambulatory Visit: Payer: Self-pay

## 2019-08-01 DIAGNOSIS — E119 Type 2 diabetes mellitus without complications: Secondary | ICD-10-CM

## 2019-08-01 NOTE — Progress Notes (Unsigned)
cbc

## 2019-08-02 LAB — MICROALBUMIN / CREATININE URINE RATIO
Creatinine, Urine: 79.7 mg/dL
Microalb/Creat Ratio: 22 mg/g creat (ref 0–29)
Microalbumin, Urine: 17.9 ug/mL

## 2019-08-02 LAB — COMPREHENSIVE METABOLIC PANEL
ALT: 27 IU/L (ref 0–44)
AST: 18 IU/L (ref 0–40)
Albumin/Globulin Ratio: 1.4 (ref 1.2–2.2)
Albumin: 4.2 g/dL (ref 4.0–5.0)
Alkaline Phosphatase: 90 IU/L (ref 39–117)
BUN/Creatinine Ratio: 25 — ABNORMAL HIGH (ref 9–20)
BUN: 15 mg/dL (ref 6–24)
Bilirubin Total: <0.2 mg/dL (ref 0.0–1.2)
CO2: 26 mmol/L (ref 20–29)
Calcium: 9.9 mg/dL (ref 8.7–10.2)
Chloride: 96 mmol/L (ref 96–106)
Creatinine, Ser: 0.61 mg/dL — ABNORMAL LOW (ref 0.76–1.27)
GFR calc Af Amer: 137 mL/min/{1.73_m2} (ref >59)
GFR calc non Af Amer: 119 mL/min/{1.73_m2} (ref >59)
Globulin, Total: 2.9 g/dL (ref 1.5–4.5)
Glucose: 132 mg/dL — ABNORMAL HIGH (ref 65–99)
Potassium: 4.7 mmol/L (ref 3.5–5.2)
Sodium: 137 mmol/L (ref 134–144)
Total Protein: 7.1 g/dL (ref 6.0–8.5)

## 2019-08-02 LAB — CBC WITH DIFFERENTIAL/PLATELET
Basophils Absolute: 0.1 10*3/uL (ref 0.0–0.2)
Basos: 1 %
EOS (ABSOLUTE): 0.5 10*3/uL — ABNORMAL HIGH (ref 0.0–0.4)
Eos: 4 %
Hematocrit: 42.4 % (ref 37.5–51.0)
Hemoglobin: 14.5 g/dL (ref 13.0–17.7)
Immature Grans (Abs): 0 10*3/uL (ref 0.0–0.1)
Immature Granulocytes: 0 %
Lymphocytes Absolute: 5.2 10*3/uL — ABNORMAL HIGH (ref 0.7–3.1)
Lymphs: 38 %
MCH: 28 pg (ref 26.6–33.0)
MCHC: 34.2 g/dL (ref 31.5–35.7)
MCV: 82 fL (ref 79–97)
Monocytes Absolute: 1.1 10*3/uL — ABNORMAL HIGH (ref 0.1–0.9)
Monocytes: 8 %
Neutrophils Absolute: 6.7 10*3/uL (ref 1.4–7.0)
Neutrophils: 49 %
Platelets: 311 10*3/uL (ref 150–450)
RBC: 5.18 x10E6/uL (ref 4.14–5.80)
RDW: 13.1 % (ref 11.6–15.4)
WBC: 13.6 10*3/uL — ABNORMAL HIGH (ref 3.4–10.8)

## 2019-08-02 LAB — LIPID PANEL
Chol/HDL Ratio: 6.3 ratio — ABNORMAL HIGH (ref 0.0–5.0)
Cholesterol, Total: 175 mg/dL (ref 100–199)
HDL: 28 mg/dL — ABNORMAL LOW (ref >39)
LDL Chol Calc (NIH): 69 mg/dL (ref 0–99)
Triglycerides: 497 mg/dL — ABNORMAL HIGH (ref 0–149)
VLDL Cholesterol Cal: 78 mg/dL — ABNORMAL HIGH (ref 5–40)

## 2019-08-02 LAB — HEMOGLOBIN A1C
Est. average glucose Bld gHb Est-mCnc: 237 mg/dL
Hgb A1c MFr Bld: 9.9 % — ABNORMAL HIGH (ref 4.8–5.6)

## 2019-08-02 LAB — TSH: TSH: 2.98 u[IU]/mL (ref 0.450–4.500)

## 2019-08-08 ENCOUNTER — Other Ambulatory Visit: Payer: Self-pay

## 2019-08-08 ENCOUNTER — Ambulatory Visit: Payer: Medicaid Other | Admitting: Family Medicine

## 2019-08-08 DIAGNOSIS — E785 Hyperlipidemia, unspecified: Secondary | ICD-10-CM

## 2019-08-08 DIAGNOSIS — E119 Type 2 diabetes mellitus without complications: Secondary | ICD-10-CM

## 2019-08-08 DIAGNOSIS — I10 Essential (primary) hypertension: Secondary | ICD-10-CM

## 2019-08-08 DIAGNOSIS — K219 Gastro-esophageal reflux disease without esophagitis: Secondary | ICD-10-CM

## 2019-08-08 MED ORDER — GLIMEPIRIDE 4 MG PO TABS: ORAL_TABLET | ORAL | 2 refills | Status: DC

## 2019-08-08 MED ORDER — LISINOPRIL 20 MG PO TABS: 20.0000 mg | ORAL_TABLET | Freq: Every day | ORAL | 2 refills | Status: DC

## 2019-08-08 MED ORDER — GABAPENTIN 300 MG PO CAPS: ORAL_CAPSULE | ORAL | 0 refills | Status: DC

## 2019-08-08 MED ORDER — EZETIMIBE 10 MG PO TABS: 10.0000 mg | ORAL_TABLET | Freq: Every day | ORAL | 2 refills | Status: DC

## 2019-08-08 MED ORDER — HYDROCHLOROTHIAZIDE 12.5 MG PO CAPS: 12.5000 mg | ORAL_CAPSULE | Freq: Every day | ORAL | 0 refills | Status: DC

## 2019-08-08 NOTE — Progress Notes (Signed)
Virtual Visit via Telephone Note  I connected with Luis [name] on 08/08/19 at  5:45 PM EST by telephone and verified that I am speaking with the correct person using two identifiers.    I discussed the limitations, risks, security and privacy concerns of performing an evaluation and management service by telephone and the availability of in person appointments. I also discussed with the patient that there may be a patient responsible charge related to this service. The patient expressed understanding and agreed to proceed.   History of Present Illness: Patient assessed via telehealth for diabetes follow up appt. At the present time, he reports checking his FBS daily with a range on 100-150s. Patient's a1c has decreased from 11.2 to 9.9. He denies hypoglycemic episodes. No adverse reactions to medications reported.     Observations/Objective: Patient is alert and oriented. He has a regular voice tone and pattern and does not present in apparent distress.   Assessment and Plan: 1. Essential hypertension - hydrochlorothiazide (MICROZIDE) 12.5 MG capsule; Take 1 capsule (12.5 mg total) by mouth daily.  Dispense: 30 capsule; Refill: 0 - lisinopril (ZESTRIL) 20 MG tablet; Take 1 tablet (20 mg total) by mouth daily.  Dispense: 30 tablet; Refill: 2 - CBC With Differential - Future; Future - Comprehensive metabolic panel; Future  2. Diabetes mellitus without complication (HCC) - gabapentin (NEURONTIN) 300 MG capsule; TAKE 2 CAPSULES IN THE MORNING, 3 CAPSULES MID DAY, AND 3 CAPSULES INTHE EVENING  Dispense: 240 capsule; Refill: 0 - glimepiride (AMARYL) 4 MG tablet; TAKE ONE TABLET BY MOUTH EVERY DAY WITH BREAKFAST  Dispense: 30 tablet; Refill: 2 - HgB A1c; Future  3. Dyslipidemia - ezetimibe (ZETIA) 10 MG tablet; Take 1 tablet (10 mg total) by mouth daily.  Dispense: 30 tablet; Refill: 2 - Lipid Panel With LDL/HDL Ratio; Future  4. Gastroesophageal reflux disease without  esophagitis  Follow Up Instructions: Continue with current medications. Encouraged carb modified diet with daily exercise.    I discussed the assessment and treatment plan with the patient. The patient was provided an opportunity to ask questions and all were answered. The patient agreed with the plan and demonstrated an understanding of the instructions.   The patient was advised to call back or seek an in-person evaluation if the symptoms worsen or if the condition fails to improve as anticipated.  I provided 10 minutes of non-face-to-face time during this encounter.   Lanae Boast, FNP

## 2019-08-08 NOTE — Patient Instructions (Signed)

## 2019-09-17 ENCOUNTER — Other Ambulatory Visit: Payer: Self-pay

## 2019-09-17 DIAGNOSIS — E119 Type 2 diabetes mellitus without complications: Secondary | ICD-10-CM

## 2019-09-17 MED ORDER — METFORMIN HCL 500 MG PO TABS: 1000.0000 mg | ORAL_TABLET | Freq: Two times a day (BID) | ORAL | 1 refills | Status: DC

## 2019-09-25 ENCOUNTER — Other Ambulatory Visit: Payer: Self-pay | Admitting: Gerontology

## 2019-09-25 DIAGNOSIS — E119 Type 2 diabetes mellitus without complications: Secondary | ICD-10-CM

## 2019-10-23 ENCOUNTER — Other Ambulatory Visit: Payer: Medicaid Other

## 2019-10-23 ENCOUNTER — Other Ambulatory Visit: Payer: Self-pay

## 2019-10-23 DIAGNOSIS — I10 Essential (primary) hypertension: Secondary | ICD-10-CM

## 2019-10-23 DIAGNOSIS — E119 Type 2 diabetes mellitus without complications: Secondary | ICD-10-CM

## 2019-10-23 DIAGNOSIS — E785 Hyperlipidemia, unspecified: Secondary | ICD-10-CM

## 2019-10-24 LAB — CBC WITH DIFFERENTIAL
Basophils Absolute: 0.1 10*3/uL (ref 0.0–0.2)
Basos: 1 %
EOS (ABSOLUTE): 0.4 10*3/uL (ref 0.0–0.4)
Eos: 3 %
Hematocrit: 45.4 % (ref 37.5–51.0)
Hemoglobin: 15.2 g/dL (ref 13.0–17.7)
Immature Grans (Abs): 0.1 10*3/uL (ref 0.0–0.1)
Immature Granulocytes: 1 %
Lymphocytes Absolute: 4.1 10*3/uL — ABNORMAL HIGH (ref 0.7–3.1)
Lymphs: 33 %
MCH: 27.9 pg (ref 26.6–33.0)
MCHC: 33.5 g/dL (ref 31.5–35.7)
MCV: 84 fL (ref 79–97)
Monocytes Absolute: 0.8 10*3/uL (ref 0.1–0.9)
Monocytes: 6 %
Neutrophils Absolute: 7.1 10*3/uL — ABNORMAL HIGH (ref 1.4–7.0)
Neutrophils: 56 %
RBC: 5.44 x10E6/uL (ref 4.14–5.80)
RDW: 13.7 % (ref 11.6–15.4)
WBC: 12.5 10*3/uL — ABNORMAL HIGH (ref 3.4–10.8)

## 2019-10-24 LAB — LIPID PANEL WITH LDL/HDL RATIO
Cholesterol, Total: 146 mg/dL (ref 100–199)
HDL: 30 mg/dL — ABNORMAL LOW (ref >39)
LDL Chol Calc (NIH): 77 mg/dL (ref 0–99)
LDL/HDL Ratio: 2.6 ratio (ref 0.0–3.6)
Triglycerides: 233 mg/dL — ABNORMAL HIGH (ref 0–149)
VLDL Cholesterol Cal: 39 mg/dL (ref 5–40)

## 2019-10-24 LAB — HEMOGLOBIN A1C
Est. average glucose Bld gHb Est-mCnc: 258 mg/dL
Hgb A1c MFr Bld: 10.6 % — ABNORMAL HIGH (ref 4.8–5.6)

## 2019-10-24 LAB — COMPREHENSIVE METABOLIC PANEL
ALT: 24 IU/L (ref 0–44)
AST: 14 IU/L (ref 0–40)
Albumin/Globulin Ratio: 1.4 (ref 1.2–2.2)
Albumin: 4 g/dL (ref 4.0–5.0)
Alkaline Phosphatase: 90 IU/L (ref 39–117)
BUN/Creatinine Ratio: 18 (ref 9–20)
BUN: 13 mg/dL (ref 6–24)
Bilirubin Total: 0.3 mg/dL (ref 0.0–1.2)
CO2: 27 mmol/L (ref 20–29)
Calcium: 9 mg/dL (ref 8.7–10.2)
Chloride: 94 mmol/L — ABNORMAL LOW (ref 96–106)
Creatinine, Ser: 0.71 mg/dL — ABNORMAL LOW (ref 0.76–1.27)
GFR calc Af Amer: 129 mL/min/{1.73_m2} (ref >59)
GFR calc non Af Amer: 112 mL/min/{1.73_m2} (ref >59)
Globulin, Total: 2.8 g/dL (ref 1.5–4.5)
Glucose: 253 mg/dL — ABNORMAL HIGH (ref 65–99)
Potassium: 5.2 mmol/L (ref 3.5–5.2)
Sodium: 135 mmol/L (ref 134–144)
Total Protein: 6.8 g/dL (ref 6.0–8.5)

## 2019-10-31 ENCOUNTER — Other Ambulatory Visit: Payer: Self-pay

## 2019-10-31 ENCOUNTER — Ambulatory Visit: Payer: Medicaid Other | Admitting: Family Medicine

## 2019-10-31 VITALS — Ht 70.0 in | Wt 360.0 lb

## 2019-10-31 DIAGNOSIS — E785 Hyperlipidemia, unspecified: Secondary | ICD-10-CM

## 2019-10-31 DIAGNOSIS — E1142 Type 2 diabetes mellitus with diabetic polyneuropathy: Secondary | ICD-10-CM

## 2019-10-31 DIAGNOSIS — I10 Essential (primary) hypertension: Secondary | ICD-10-CM

## 2019-10-31 DIAGNOSIS — IMO0002 Reserved for concepts with insufficient information to code with codable children: Secondary | ICD-10-CM

## 2019-10-31 DIAGNOSIS — E1165 Type 2 diabetes mellitus with hyperglycemia: Secondary | ICD-10-CM

## 2019-10-31 MED ORDER — GLIMEPIRIDE 4 MG PO TABS: ORAL_TABLET | ORAL | 2 refills | Status: DC

## 2019-10-31 MED ORDER — METFORMIN HCL 1000 MG PO TABS: 1000.0000 mg | ORAL_TABLET | Freq: Two times a day (BID) | ORAL | 0 refills | Status: DC

## 2019-10-31 MED ORDER — BASAGLAR KWIKPEN 100 UNIT/ML ~~LOC~~ SOPN: 35.0000 [IU] | PEN_INJECTOR | Freq: Every day | SUBCUTANEOUS | 2 refills | Status: DC

## 2019-10-31 NOTE — Patient Instructions (Signed)

## 2019-10-31 NOTE — Progress Notes (Signed)
   Virtual Visit via Telephone Note  I connected with Luis [name] on 10/31/19 at  5:45 PM EST by telephone and verified that I am speaking with the correct person using two identifiers.   I discussed the limitations, risks, security and privacy concerns of performing an evaluation and management service by telephone and the availability of in person appointments. I also discussed with the patient that there may be a patient responsible charge related to this service. The patient expressed understanding and agreed to proceed.   History of Present Illness: Luis [name]  has a past medical history of Back pain, Diabetes mellitus without complication (HCC), High cholesterol, Hypertension, and Neuropathy. Patient reports FBS ranging in 200s-250. He states that he is compliant with his medications. A1C has increased from 9.9 to 10.6. He is not following a heart healthy or carb modified diet. He is not exercising at the present time. He reports that he has started drinking sugar free sodas.    Observations/Objective: Patient with regular voice tone, rate and rhythm. Speaking calmly and is in no apparent distress.    Assessment and Plan: 1. Uncontrolled type 2 diabetes mellitus with diabetic polyneuropathy, with long-term current use of insulin (HCC) Increased insulin to 35 units QHS.  - Insulin Glargine (BASAGLAR KWIKPEN) 100 UNIT/ML; Inject 0.35 mLs (35 Units total) into the skin at bedtime.  Dispense: 15 mL; Refill: 2 - metFORMIN (GLUCOPHAGE) 1000 MG tablet; Take 1 tablet (1,000 mg total) by mouth 2 (two) times daily.  Dispense: 180 tablet; Refill: 0 - glimepiride (AMARYL) 4 MG tablet; TAKE ONE TABLET BY MOUTH EVERY DAY WITH BREAKFAST  Dispense: 30 tablet; Refill: 2  2. Essential hypertension No medication changes warranted at the present time.  3. Dyslipidemia The patient is asked to make an attempt to improve diet and exercise patterns to aid in medical management of this problem.      Labs reviewed with patient.  Follow Up Instructions:  We discussed hand washing, using hand sanitizer when soap and water are not available, only going out when absolutely necessary, and social distancing. Explained to patient that he is immunocompromised and will need to take precautions during this time.   I discussed the assessment and treatment plan with the patient. The patient was provided an opportunity to ask questions and all were answered. The patient agreed with the plan and demonstrated an understanding of the instructions.   The patient was advised to call back or seek an in-person evaluation if the symptoms worsen or if the condition fails to improve as anticipated.  I provided 15 minutes of non-face-to-face time during this encounter.  Ms. Andr L. Riley Lam, FNP-BC [facility] [address] [place], [place] [address] [phone]

## 2019-11-27 ENCOUNTER — Other Ambulatory Visit: Payer: Self-pay

## 2019-11-27 ENCOUNTER — Other Ambulatory Visit: Payer: Self-pay | Admitting: Gerontology

## 2019-11-27 DIAGNOSIS — E119 Type 2 diabetes mellitus without complications: Secondary | ICD-10-CM

## 2019-11-27 MED ORDER — GABAPENTIN 300 MG PO CAPS: ORAL_CAPSULE | ORAL | 0 refills | Status: DC

## 2019-12-05 ENCOUNTER — Ambulatory Visit: Payer: Medicaid Other

## 2019-12-19 ENCOUNTER — Other Ambulatory Visit: Payer: Self-pay

## 2019-12-19 ENCOUNTER — Ambulatory Visit: Payer: Medicaid Other | Admitting: Urology

## 2019-12-19 VITALS — BP 154/89 | HR 95 | Temp 96.7°F | Wt 374.8 lb

## 2019-12-19 DIAGNOSIS — IMO0002 Reserved for concepts with insufficient information to code with codable children: Secondary | ICD-10-CM

## 2019-12-19 DIAGNOSIS — E119 Type 2 diabetes mellitus without complications: Secondary | ICD-10-CM

## 2019-12-19 DIAGNOSIS — I10 Essential (primary) hypertension: Secondary | ICD-10-CM

## 2019-12-19 DIAGNOSIS — E1142 Type 2 diabetes mellitus with diabetic polyneuropathy: Secondary | ICD-10-CM

## 2019-12-19 MED ORDER — GABAPENTIN 300 MG PO CAPS: ORAL_CAPSULE | ORAL | 1 refills | Status: DC

## 2019-12-19 MED ORDER — LISINOPRIL 20 MG PO TABS: 20.0000 mg | ORAL_TABLET | Freq: Every day | ORAL | 1 refills | Status: DC
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
## 2020-01-16 ENCOUNTER — Ambulatory Visit: Payer: Medicaid Other

## 2020-01-23 ENCOUNTER — Ambulatory Visit: Payer: Medicaid Other | Admitting: Urology

## 2020-01-23 ENCOUNTER — Other Ambulatory Visit: Payer: Self-pay

## 2020-01-23 VITALS — BP 137/80 | HR 86 | Temp 97.0°F | Ht 68.0 in | Wt 368.2 lb

## 2020-01-23 DIAGNOSIS — J440 Chronic obstructive pulmonary disease with acute lower respiratory infection: Secondary | ICD-10-CM

## 2020-01-23 DIAGNOSIS — E1142 Type 2 diabetes mellitus with diabetic polyneuropathy: Secondary | ICD-10-CM

## 2020-01-23 MED ORDER — BENZONATATE 100 MG PO CAPS: 200.0000 mg | ORAL_CAPSULE | Freq: Three times a day (TID) | ORAL | 0 refills | Status: DC | PRN

## 2020-01-23 MED ORDER — CEPHALEXIN 500 MG PO CAPS: 500.0000 mg | ORAL_CAPSULE | Freq: Four times a day (QID) | ORAL | 1 refills | Status: DC

## 2020-01-23 NOTE — Progress Notes (Signed)
Patient: Luis [name] Male    DOB: 1971/06/21   49 y.o.   MRN: [id number] Visit Date: 01/23/2020  Today's Provider: [place]   Chief Complaint  Patient presents with  . Cough    worse when laying down   . Peripheral Neuropathy    severe pain in hands and feet    Subjective:    HPI Luis [name] is a 49 y.o. male who has had a chest cold two weeks ago.  He is coughing up yellow phlegm.  He has not had a fever or chills.  He has a sinus HA above his left eye.  Nyquil did not provide relief.   He still is able to taste and smell.  It is worse when he lays down.    On maximum dose of gabapentin and still in pain    No Known Allergies Previous Medications   ASPIRIN EC 81 MG EC TABLET    Take 1 tablet (81 mg total) by mouth daily.   ATORVASTATIN (LIPITOR) 40 MG TABLET    Take 1 tablet (40 mg total) by mouth daily.   CLOPIDOGREL (PLAVIX) 75 MG TABLET    Take 1 tablet (75 mg total) by mouth daily.   EZETIMIBE (ZETIA) 10 MG TABLET    Take 1 tablet (10 mg total) by mouth daily.   GABAPENTIN (NEURONTIN) 300 MG CAPSULE    Take 3 capsules in the morning, 3 capsules mid day and 3 capsules in the evening   GLIMEPIRIDE (AMARYL) 4 MG TABLET    TAKE ONE TABLET BY MOUTH EVERY DAY WITH BREAKFAST   HYDROCHLOROTHIAZIDE (MICROZIDE) 12.5 MG CAPSULE    Take 1 capsule (12.5 mg total) by mouth daily.   IBUPROFEN (ADVIL,MOTRIN) 200 MG TABLET    Take 200-400 mg by mouth every 6 (six) hours as needed for fever or mild pain.    INSULIN GLARGINE (BASAGLAR KWIKPEN) 100 UNIT/ML    Inject 0.35 mLs (35 Units total) into the skin at bedtime.   LISINOPRIL (ZESTRIL) 20 MG TABLET    Take 1 tablet (20 mg total) by mouth daily.   METFORMIN (GLUCOPHAGE) 1000 MG TABLET    Take 1 tablet (1,000 mg total) by mouth 2 (two) times daily.   METHADONE (DOLOPHINE) 10 MG/5ML SOLUTION    Take 140 mg by mouth daily.    METOPROLOL TARTRATE (LOPRESSOR) 25 MG TABLET    Take 1 tablet (25 mg total) by mouth 2 (two) times  daily.    Review of Systems  Social History   Tobacco Use  . Smoking status: Current Every Day Smoker    Packs/day: 1.00    Years: 35.00    Pack years: 35.00    Types: Cigarettes  . Smokeless tobacco: Never Used  Substance Use Topics  . Alcohol use: Yes    Alcohol/week: 1.0 - 2.0 standard drinks    Types: 1 - 2 Cans of beer per week   Objective:   BP 137/80   Pulse 86   Temp (!) 97 F (36.1 C)   Ht 5\' 8"  (1.727 m)   Wt (!) 368 lb 3.2 oz (167 kg)   SpO2 90%   BMI 55.98 kg/m   Physical Exam    Assessment & Plan:   1. Acute bronchitis with COPD (HCC) - Keflex QID x 10 days - benzonatate TID prn  - RTC in two weeks for recheck  2. Diabetic peripheral neuropathy (HCC) - continue present dose of gabapentin - offered TCA  or SSRI, but patient refused   [facility]   [facility]

## 2020-02-06 ENCOUNTER — Ambulatory Visit: Payer: Medicaid Other

## 2020-02-17 ENCOUNTER — Other Ambulatory Visit: Payer: Self-pay

## 2020-02-17 DIAGNOSIS — E119 Type 2 diabetes mellitus without complications: Secondary | ICD-10-CM

## 2020-02-17 MED ORDER — GABAPENTIN 300 MG PO CAPS: ORAL_CAPSULE | ORAL | 0 refills | Status: DC

## 2020-02-17 NOTE — Progress Notes (Signed)
4 day supply until appt on 02/20/20

## 2020-02-18 ENCOUNTER — Telehealth: Payer: Self-pay | Admitting: Pharmacy Technician

## 2020-02-18 NOTE — Telephone Encounter (Signed)
Patient provided updated Patient Intake Application and paystubs and 2020 Federal Tax Return from support person.  Still need Social Security Award Letter, [employer] widow's benefit verification and unemployment verification letter from support person.  Patient verbally acknowledged that no additional medication assistance will be provided until all requested financial information is provided.  Sherilyn Dacosta Care Manager [facility]

## 2020-02-20 ENCOUNTER — Ambulatory Visit: Payer: Medicaid Other | Admitting: Family Medicine

## 2020-02-20 ENCOUNTER — Other Ambulatory Visit: Payer: Self-pay

## 2020-02-20 ENCOUNTER — Other Ambulatory Visit: Payer: Self-pay | Admitting: Family Medicine

## 2020-02-20 VITALS — BP 144/84 | HR 90 | Temp 99.7°F | Resp 18 | Ht 67.0 in | Wt 373.3 lb

## 2020-02-20 DIAGNOSIS — I10 Essential (primary) hypertension: Secondary | ICD-10-CM

## 2020-02-20 DIAGNOSIS — Z09 Encounter for follow-up examination after completed treatment for conditions other than malignant neoplasm: Secondary | ICD-10-CM

## 2020-02-20 DIAGNOSIS — E119 Type 2 diabetes mellitus without complications: Secondary | ICD-10-CM

## 2020-02-20 MED ORDER — HYDROCHLOROTHIAZIDE 12.5 MG PO CAPS: 12.5000 mg | ORAL_CAPSULE | Freq: Every day | ORAL | 0 refills | Status: DC

## 2020-02-20 MED ORDER — LISINOPRIL 20 MG PO TABS: 20.0000 mg | ORAL_TABLET | Freq: Every day | ORAL | 1 refills | Status: DC

## 2020-02-20 MED ORDER — GABAPENTIN 300 MG PO CAPS: ORAL_CAPSULE | ORAL | 0 refills | Status: DC

## 2020-02-20 NOTE — Patient Instructions (Signed)
Tobacco Use Disorder Tobacco use disorder (TUD) occurs when a person craves, seeks, and uses tobacco, regardless of the consequences. This disorder can cause problems with mental and physical health. It can affect your ability to have healthy relationships, and it can keep you from meeting your responsibilities at work, home, or school. Tobacco may be:  Smoked as a cigarette or cigar.  Inhaled using e-cigarettes.  Smoked in a pipe or hookah.  Chewed as smokeless tobacco.  Inhaled into the nostrils as snuff. Tobacco products contain a dangerous chemical called nicotine, which is very addictive. Nicotine triggers hormones that make the body feel stimulated and works on areas of the brain that make you feel good. These effects can make it hard for people to quit nicotine. Tobacco contains many other unsafe chemicals that can damage almost every organ in the body. Smoking tobacco also puts others in danger due to fire risk and possible health problems caused by breathing in secondhand smoke. What are the signs or symptoms? Symptoms of TUD may include:  Being unable to slow down or stop your tobacco use.  Spending an abnormal amount of time getting or using tobacco.  Craving tobacco. Cravings may last for up to 6 months after quitting.  Tobacco use that: ? Interferes with your work, school, or home life. ? Interferes with your personal and social relationships. ? Makes you give up activities that you once enjoyed or found important.  Using tobacco even though you know that it is: ? Dangerous or bad for your health or someone else's health. ? Causing problems in your life.  Needing more and more of the substance to get the same effect (developing tolerance).  Experiencing unpleasant symptoms if you do not use the substance (withdrawal). Withdrawal symptoms may include: ? Depressed, anxious, or irritable mood. ? Difficulty concentrating. ? Increased appetite. ? Restlessness or trouble  sleeping.  Using the substance to avoid withdrawal. How is this diagnosed? This condition may be diagnosed based on:  Your current and past tobacco use. Your health care provider may ask questions about how your tobacco use affects your life.  A physical exam. You may be diagnosed with TUD if you have at least two symptoms within a 12-month period. How is this treated? This condition is treated by stopping tobacco use. Many people are unable to quit on their own and need help. Treatment may include:  Nicotine replacement therapy (NRT). NRT provides nicotine without the other harmful chemicals in tobacco. NRT gradually lowers the dosage of nicotine in the body and reduces withdrawal symptoms. NRT is available as: ? Over-the-counter gums, lozenges, and skin patches. ? Prescription mouth inhalers and nasal sprays.  Medicine that acts on the brain to reduce cravings and withdrawal symptoms.  A type of talk therapy that examines your triggers for tobacco use, how to avoid them, and how to cope with cravings (behavioral therapy).  Hypnosis. This may help with withdrawal symptoms.  Joining a support group for others coping with TUD. The best treatment for TUD is usually a combination of medicine, talk therapy, and support groups. Recovery can be a long process. Many people start using tobacco again after stopping (relapse). If you relapse, it does not mean that treatment will not work. Follow these instructions at home:  Lifestyle  Do not use any products that contain nicotine or tobacco, such as cigarettes and e-cigarettes.  Avoid things that trigger tobacco use as much as you can. Triggers include people and situations that usually cause you   to use tobacco.  Avoid drinks that contain caffeine, including coffee. These may worsen some withdrawal symptoms.  Find ways to manage stress. Wanting to smoke may cause stress, and stress can make you want to smoke. Relaxation techniques such as  deep breathing, meditation, and yoga may help.  Attend support groups as needed. These groups are an important part of long-term recovery for many people. General instructions  Take over-the-counter and prescription medicines only as told by your health care provider.  Check with your health care provider before taking any new prescription or over-the-counter medicines.  Decide on a friend, family member, or smoking quit-line (such as 1-800-QUIT-NOW in the [place] that you can call or text when you feel the urge to smoke or when you need help coping with cravings.  Keep all follow-up visits as told by your health care provider and therapist. This is important. Contact a health care provider if:  You are not able to take your medicines as prescribed.  Your symptoms get worse, even with treatment. Summary  Tobacco use disorder (TUD) occurs when a person craves, seeks, and uses tobacco regardless of the consequences.  This condition may be diagnosed based on your current and past tobacco use and a physical exam.  Many people are unable to quit on their own and need help. Recovery can be a long process.  The most effective treatment for TUD is usually a combination of medicine, talk therapy, and support groups. This information is not intended to replace advice given to you by your health care provider. Make sure you discuss any questions you have with your health care provider. Document Revised: 07/26/2017 Document Reviewed: 07/26/2017 Elsevier Patient Education  2020 [employer]  

## 2020-02-20 NOTE — Progress Notes (Signed)
[facility]   Progress Note: General Provider: Mike Gip, FNP  SUBJECTIVE:   Luis [name] is a 49 y.o. male who  has a past medical history of Back pain, Diabetes mellitus without complication (HCC), High cholesterol, Hypertension, and Neuropathy.. Patient presents today for Follow-up (Was given antibiotics for URI and was scheduled to follow-up in 2 weeks)  Patient states that he completed his antibiotic course without complications. He reports that he is feeling better as evidenced by unlabored breathing,less congestion and a decrease in coughing. He continues to smoke and is not interested in quitting at the present time. He needs refills of gabapentin, lisinopril and HCTZ. No other problems or concerns at the present time.   Review of Systems  Constitutional: Negative.   Respiratory: Positive for cough.   Cardiovascular: Negative.   Psychiatric/Behavioral: Negative.      OBJECTIVE: BP (!) 144/84 (BP Location: Right Arm, Patient Position: Sitting, Cuff Size: Large)   Pulse 90   Temp 99.7 F (37.6 C) (Oral)   Resp 18   Ht 5\' 7"  (1.702 m)   Wt (!) 373 lb 4.8 oz (169.3 kg)   SpO2 95%   BMI 58.47 kg/m   Wt Readings from Last 3 Encounters:  02/20/20 (!) 373 lb 4.8 oz (169.3 kg)  01/23/20 (!) 368 lb 3.2 oz (167 kg)  12/19/19 (!) 374 lb 12.8 oz (170 kg)     Physical Exam Constitutional:      General: He is not in acute distress. HENT:     Head: Normocephalic and atraumatic.  Eyes:     Extraocular Movements: Extraocular movements intact.     Conjunctiva/sclera: Conjunctivae normal.     Pupils: Pupils are equal, round, and reactive to light.  Cardiovascular:     Rate and Rhythm: Normal rate and regular rhythm.  Pulmonary:     Effort: Pulmonary effort is normal.     Breath sounds: No wheezing, rhonchi or rales.  Neurological:     Mental Status: He is alert and oriented to person, place, and time.  Psychiatric:        Mood and Affect: Mood  normal.        Behavior: Behavior normal.        Thought Content: Thought content normal.        Judgment: Judgment normal.     ASSESSMENT/PLAN:   1. Diabetes mellitus without complication (HCC) Medication refilled. Labs ordered for DM follow up visit.  - gabapentin (NEURONTIN) 300 MG capsule; Take 3 capsules in the morning, 3 capsules mid day and 3 capsules in the evening  Dispense: 270 capsule; Refill: 0 - Comprehensive metabolic panel; Future - Hemoglobin A1c; Future  2. Essential hypertension No medication changes warranted at the present time.  - lisinopril (ZESTRIL) 20 MG tablet; Take 1 tablet (20 mg total) by mouth daily.  Dispense: 90 tablet; Refill: 1 - hydrochlorothiazide (MICROZIDE) 12.5 MG capsule; Take 1 capsule (12.5 mg total) by mouth daily.  Dispense: 30 capsule; Refill: 0  3. Follow up Smoking cessation instruction/counseling given:  counseled patient on the dangers of tobacco use, advised patient to stop smoking, and reviewed strategies to maximize success  Bronchitis resolved. Encouraged smoking cessation.   Return in about 4 weeks (around 03/19/2020), or labs 1 week prior, for DM2.    The patient was given clear instructions to go to ER or return to medical center if symptoms do not improve, worsen or new problems develop. The patient verbalized understanding and  agreed with plan of care.   Ms. Freda Jackson. Riley Lam, FNP-BC [facility]

## 2020-03-11 ENCOUNTER — Other Ambulatory Visit: Payer: Medicaid Other

## 2020-03-12 ENCOUNTER — Other Ambulatory Visit: Payer: Self-pay

## 2020-03-12 DIAGNOSIS — IMO0002 Reserved for concepts with insufficient information to code with codable children: Secondary | ICD-10-CM

## 2020-03-12 MED ORDER — BASAGLAR KWIKPEN 100 UNIT/ML ~~LOC~~ SOPN: 35.0000 [IU] | PEN_INJECTOR | Freq: Every day | SUBCUTANEOUS | 2 refills | Status: DC

## 2020-03-17 ENCOUNTER — Other Ambulatory Visit: Payer: Self-pay | Admitting: Gerontology

## 2020-03-17 ENCOUNTER — Other Ambulatory Visit: Payer: Self-pay

## 2020-03-17 DIAGNOSIS — E119 Type 2 diabetes mellitus without complications: Secondary | ICD-10-CM

## 2020-03-17 DIAGNOSIS — IMO0002 Reserved for concepts with insufficient information to code with codable children: Secondary | ICD-10-CM

## 2020-03-18 ENCOUNTER — Other Ambulatory Visit: Payer: Medicaid Other

## 2020-03-18 ENCOUNTER — Other Ambulatory Visit: Payer: Self-pay

## 2020-03-18 DIAGNOSIS — E119 Type 2 diabetes mellitus without complications: Secondary | ICD-10-CM

## 2020-03-19 LAB — COMPREHENSIVE METABOLIC PANEL
ALT: 18 IU/L (ref 0–44)
AST: 11 IU/L (ref 0–40)
Albumin/Globulin Ratio: 1.4 (ref 1.2–2.2)
Albumin: 4 g/dL (ref 4.0–5.0)
Alkaline Phosphatase: 94 IU/L (ref 48–121)
BUN/Creatinine Ratio: 19 (ref 9–20)
BUN: 12 mg/dL (ref 6–24)
Bilirubin Total: 0.3 mg/dL (ref 0.0–1.2)
CO2: 29 mmol/L (ref 20–29)
Calcium: 9.5 mg/dL (ref 8.7–10.2)
Chloride: 94 mmol/L — ABNORMAL LOW (ref 96–106)
Creatinine, Ser: 0.63 mg/dL — ABNORMAL LOW (ref 0.76–1.27)
GFR calc Af Amer: 135 mL/min/{1.73_m2} (ref >59)
GFR calc non Af Amer: 117 mL/min/{1.73_m2} (ref >59)
Globulin, Total: 2.9 g/dL (ref 1.5–4.5)
Glucose: 218 mg/dL — ABNORMAL HIGH (ref 65–99)
Potassium: 5.1 mmol/L (ref 3.5–5.2)
Sodium: 139 mmol/L (ref 134–144)
Total Protein: 6.9 g/dL (ref 6.0–8.5)

## 2020-03-19 LAB — HEMOGLOBIN A1C
Est. average glucose Bld gHb Est-mCnc: 246 mg/dL
Hgb A1c MFr Bld: 10.2 % — ABNORMAL HIGH (ref 4.8–5.6)

## 2020-03-26 ENCOUNTER — Other Ambulatory Visit: Payer: Self-pay

## 2020-03-26 ENCOUNTER — Ambulatory Visit: Payer: Medicaid Other | Admitting: Family Medicine

## 2020-03-26 ENCOUNTER — Other Ambulatory Visit: Payer: Self-pay | Admitting: Family Medicine

## 2020-03-26 VITALS — BP 140/82 | HR 104 | Ht 68.0 in | Wt 375.0 lb

## 2020-03-26 DIAGNOSIS — R600 Localized edema: Secondary | ICD-10-CM | POA: Insufficient documentation

## 2020-03-26 DIAGNOSIS — IMO0002 Reserved for concepts with insufficient information to code with codable children: Secondary | ICD-10-CM | POA: Insufficient documentation

## 2020-03-26 DIAGNOSIS — G479 Sleep disorder, unspecified: Secondary | ICD-10-CM

## 2020-03-26 DIAGNOSIS — E1165 Type 2 diabetes mellitus with hyperglycemia: Secondary | ICD-10-CM

## 2020-03-26 DIAGNOSIS — I10 Essential (primary) hypertension: Secondary | ICD-10-CM

## 2020-03-26 DIAGNOSIS — G4733 Obstructive sleep apnea (adult) (pediatric): Secondary | ICD-10-CM

## 2020-03-26 MED ORDER — METOPROLOL TARTRATE 25 MG PO TABS: 25.0000 mg | ORAL_TABLET | Freq: Two times a day (BID) | ORAL | 2 refills | Status: DC

## 2020-03-26 MED ORDER — CVS GLUCOSE METER TEST STRIPS VI STRP: ORAL_STRIP | 12 refills | Status: AC

## 2020-03-26 MED ORDER — HYDROCHLOROTHIAZIDE 12.5 MG PO CAPS: 25.0000 mg | ORAL_CAPSULE | Freq: Every day | ORAL | 3 refills | Status: DC

## 2020-03-26 MED ORDER — FREESTYLE LANCETS MISC
12 refills | Status: AC
Start: 2020-03-26 — End: ?

## 2020-03-26 MED ORDER — ATORVASTATIN CALCIUM 40 MG PO TABS: 80.0000 mg | ORAL_TABLET | Freq: Every day | ORAL | 2 refills | Status: DC

## 2020-03-26 MED ORDER — BASAGLAR KWIKPEN 100 UNIT/ML ~~LOC~~ SOPN: 40.0000 [IU] | PEN_INJECTOR | Freq: Every day | SUBCUTANEOUS | 2 refills | Status: DC

## 2020-03-26 NOTE — Assessment & Plan Note (Signed)
A1c over 10 He likely needs mealtime insulin - asked him to check blood sugars before meals and keep log Increase basaglar to 40 units

## 2020-03-26 NOTE — Progress Notes (Signed)
Established Patient Office Visit  Subjective:  Patient ID: Luis [name], male    DOB: December 20, 1970  Age: 49 y.o. MRN: [id number]  CC:  Chief Complaint  Patient presents with   Diabetes   Edema    bilateral lower extremity     HPI Luis [name] presents for routine follow up  Legs have been swelling.  For Luis [name] while, worse recently.  Up all day.  Present for years, but worse recently.  Swelling in the evening.  HCTZ would help keep swelling down.  SOB.  Depends on what he's doing.  No PND.  Sleeps on 2 pillows.  No chest pain.  More intense in last few weeks, middle of last month.  Legs swell when he lets them hang.  No CP.   Diabetes - takes amayl 4 mg.  35 units glargine at night. Metformin.  BG's during the day - first thing around 140 - 130.  No low blood sugars.    He's been told he needs sleep study, but he can't afford equipment.  He's working on disability.   Past Medical History:  Diagnosis Date   Back pain    Diabetes mellitus without complication (HCC)    High cholesterol    Hypertension    Neuropathy     Past Surgical History:  Procedure Laterality Date   KNEE SURGERY Right    LEFT HEART CATH AND CORONARY ANGIOGRAPHY N/Luis [name] 05/21/2019   Procedure: LEFT HEART CATH AND CORONARY ANGIOGRAPHY;  Surgeon: Luis Millard, MD;  Location: [facility];  Service: Cardiovascular;  Laterality: N/Luis [name];    Family History  Problem Relation Age of Onset   Cancer Mother    Heart disease Father    Diabetes Paternal Grandfather     Social History   Socioeconomic History   Marital status: Divorced    Spouse name: Not on file   Number of children: 2   Years of education: Not on file   Highest education level: High school graduate  Occupational History   Occupation: unemployed  Tobacco Use   Smoking status: Current Every Day Smoker    Packs/day: 1.00    Years: 35.00    Pack years: 35.00    Types: Cigarettes   Smokeless tobacco: Never  Used  Building services engineer Use: Never used  Substance and Sexual Activity   Alcohol use: Yes    Alcohol/week: 1.0 - 2.0 standard drink    Types: 1 - 2 Cans of beer per week   Drug use: No   Sexual activity: Not Currently    Birth control/protection: Condom  Other Topics Concern   Not on file  Social History Narrative   On food stamps, which helps with food. Living with mom as has been unemployed due to nueropathy.   Social Determinants of Health   Financial Resource Strain: Low Risk    Difficulty of Paying Living Expenses: Not hard at all  Food Insecurity: No Food Insecurity   Worried About Programme researcher, broadcasting/film/video in the Last Year: Never true   Ran Out of Food in the Last Year: Never true  Transportation Needs: No Transportation Needs   Lack of Transportation (Medical): No   Lack of Transportation (Non-Medical): No  Physical Activity:    Days of Exercise per Week:    Minutes of Exercise per Session:   Stress: No Stress Concern Present   Feeling of Stress : Not at all  Social Connections:    Frequency of  Communication with Friends and Family:    Frequency of Social Gatherings with Friends and Family:    Attends Religious Services:    Active Member of Clubs or Organizations:    Attends Engineer, structural:    Marital Status:   Intimate Partner Violence: Not At Risk   Fear of Current or Ex-Partner: No   Emotionally Abused: No   Physically Abused: No   Sexually Abused: No    Outpatient Medications Prior to Visit  Medication Sig Dispense Refill   aspirin EC 81 MG EC tablet Take 1 tablet (81 mg total) by mouth daily. 30 tablet 0   ezetimibe (ZETIA) 10 MG tablet Take 1 tablet (10 mg total) by mouth daily. 30 tablet 2   gabapentin (NEURONTIN) 300 MG capsule TAKE 3 CAPSULES BY MOUTH EVERY MORNING, 3 CAPSULES MID DAY AND 3 CAPSULES EVERY EVENING 270 capsule 0   glimepiride (AMARYL) 4 MG tablet TAKE ONE TABLET BY MOUTH EVERY DAY WITH BREAKFAST 30  tablet 0   lisinopril (ZESTRIL) 20 MG tablet Take 1 tablet (20 mg total) by mouth daily. 90 tablet 1   metFORMIN (GLUCOPHAGE) 1000 MG tablet Take 1 tablet (1,000 mg total) by mouth 2 (two) times daily. 180 tablet 0   methadone (DOLOPHINE) 10 MG/5ML solution Take 140 mg by mouth daily.      atorvastatin (LIPITOR) 40 MG tablet Take 1 tablet (40 mg total) by mouth daily. (Patient taking differently: Take 80 mg by mouth daily. ) 30 tablet 1   hydrochlorothiazide (MICROZIDE) 12.5 MG capsule Take 1 capsule (12.5 mg total) by mouth daily. 30 capsule 0   Insulin Glargine (BASAGLAR KWIKPEN) 100 UNIT/ML Inject 0.35 mLs (35 Units total) into the skin at bedtime. 15 mL 2   metoprolol tartrate (LOPRESSOR) 25 MG tablet Take 1 tablet (25 mg total) by mouth 2 (two) times daily. 60 tablet 1   ibuprofen (ADVIL,MOTRIN) 200 MG tablet Take 200-400 mg by mouth every 6 (six) hours as needed for fever or mild pain.      No facility-administered medications prior to visit.    No Known Allergies  ROS Review of Systems  Cardiovascular: Positive for leg swelling. Negative for chest pain.      Objective:    Physical Exam Constitutional:      Appearance: Normal appearance.  HENT:     Head: Normocephalic.  Cardiovascular:     Rate and Rhythm: Tachycardia present.     Pulses: Normal pulses.     Heart sounds: No murmur heard.  No friction rub. No gallop.   Pulmonary:     Effort: Pulmonary effort is normal. No respiratory distress.  Abdominal:     General: There is no distension.     Palpations: Abdomen is soft.     Tenderness: There is no abdominal tenderness.     Comments: protuberant  Musculoskeletal:        General: Swelling (bilateral LE swelling) present. Normal range of motion.     Cervical back: Neck supple.  Skin:    General: Skin is warm.  Neurological:     General: No focal deficit present.     Mental Status: He is alert and oriented to person, place, and time.  Psychiatric:         Mood and Affect: Mood normal.        Behavior: Behavior normal.     BP 140/82 (BP Location: Right Arm, Patient Position: Sitting)    Pulse (!) 104    Ht  5\' 8"  (1.727 m)    Wt (!) 375 lb (170.1 kg)    SpO2 93%    BMI 57.02 kg/m  Wt Readings from Last 3 Encounters:  03/26/20 (!) 375 lb (170.1 kg)  03/19/20 (!) 367 lb 4.8 oz (166.6 kg)  02/20/20 (!) 373 lb 4.8 oz (169.3 kg)     Health Maintenance Due  Topic Date Due   Hepatitis C Screening  Never done   FOOT EXAM  Never done   OPHTHALMOLOGY EXAM  Never done   COVID-19 Vaccine (1) Never done   HIV Screening  Never done   TETANUS/TDAP  Never done   INFLUENZA VACCINE  03/22/2020    There are no preventive care reminders to display for this patient.  Lab Results  Component Value Date   TSH 2.980 08/01/2019   Lab Results  Component Value Date   WBC 12.5 (H) 10/23/2019   HGB 15.2 10/23/2019   HCT 45.4 10/23/2019   MCV 84 10/23/2019   PLT 311 08/01/2019   Lab Results  Component Value Date   NA 139 03/18/2020   K 5.1 03/18/2020   CO2 29 03/18/2020   GLUCOSE 218 (H) 03/18/2020   BUN 12 03/18/2020   CREATININE 0.63 (L) 03/18/2020   BILITOT 0.3 03/18/2020   ALKPHOS 94 03/18/2020   AST 11 03/18/2020   ALT 18 03/18/2020   PROT 6.9 03/18/2020   ALBUMIN 4.0 03/18/2020   CALCIUM 9.5 03/18/2020   ANIONGAP 12 05/22/2019   Lab Results  Component Value Date   CHOL 146 10/23/2019   Lab Results  Component Value Date   HDL 30 (L) 10/23/2019   Lab Results  Component Value Date   LDLCALC 77 10/23/2019   Lab Results  Component Value Date   TRIG 233 (H) 10/23/2019   Lab Results  Component Value Date   CHOLHDL 6.3 (H) 08/01/2019   Lab Results  Component Value Date   HGBA1C 10.2 (H) 03/18/2020      Assessment & Plan:   Problem List Items Addressed This Visit      Cardiovascular and Mediastinum   Hypertension    BP above goal Will increase HCTZ to 25 mg daily Follow at next visit  Repeat labs in 2-4  weeks      Relevant Medications   atorvastatin (LIPITOR) 40 MG tablet   hydrochlorothiazide (MICROZIDE) 12.5 MG capsule   metoprolol tartrate (LOPRESSOR) 25 MG tablet     Respiratory   OSA (obstructive sleep apnea)    He's been told he needs sleep study, but concerned about cost Will refer to neurology at [facility] for sleep study        Endocrine   Uncontrolled type 2 diabetes mellitus (HCC)    A1c over 10 He likely needs mealtime insulin - asked him to check blood sugars before meals and keep log Increase basaglar to 40 units      Relevant Medications   atorvastatin (LIPITOR) 40 MG tablet   Insulin Glargine (BASAGLAR KWIKPEN) 100 UNIT/ML     Other   Bilateral lower extremity edema - Primary    Bilateral LE edema - ddx includes HF, nephrotic syndrome, venous stasis.  With bilateral nature and fluctuating degree of swelling, low suspicion for VTE. EF 50-55% in 2020 Will trial increased dose of HCTZ -> if this doesn't work, consider transition to lasix Normal creatinine, normal LFT's, follow UA for proteinuria.  Follow BNP. Given return precautions.      Relevant Orders   Urinalysis  TSH   B Nat Peptide    Other Visit Diagnoses    Uncontrolled type 2 diabetes mellitus with diabetic polyneuropathy, with long-term current use of insulin (HCC)       Relevant Medications   atorvastatin (LIPITOR) 40 MG tablet   Insulin Glargine (BASAGLAR KWIKPEN) 100 UNIT/ML   Other Relevant Orders   PR BLOOD GLUCOSE TEST STRIPS   Sleep disorder       Relevant Orders   Ambulatory referral to Neurology      Meds ordered this encounter  Medications   atorvastatin (LIPITOR) 40 MG tablet    Sig: Take 2 tablets (80 mg total) by mouth daily.    Dispense:  60 tablet    Refill:  2   hydrochlorothiazide (MICROZIDE) 12.5 MG capsule    Sig: Take 2 capsules (25 mg total) by mouth daily.    Dispense:  60 capsule    Refill:  3   metoprolol tartrate (LOPRESSOR) 25 MG tablet    Sig: Take 1  tablet (25 mg total) by mouth 2 (two) times daily.    Dispense:  60 tablet    Refill:  2   Insulin Glargine (BASAGLAR KWIKPEN) 100 UNIT/ML    Sig: Inject 0.4 mLs (40 Units total) into the skin at bedtime.    Dispense:  15 mL    Refill:  2   Lancets (FREESTYLE) lancets    Sig: Use as instructed    Dispense:  100 each    Refill:  12   glucose blood ([facility] GLUCOSE METER TEST STRIPS) test strip    Sig: Use as instructed    Dispense:  100 each    Refill:  12    Follow-up: Return in about 2 weeks (around 04/09/2020).    Lacretia Nicks, MD

## 2020-03-26 NOTE — Assessment & Plan Note (Signed)
He's been told he needs sleep study, but concerned about cost Will refer to neurology at [facility] for sleep study

## 2020-03-26 NOTE — Assessment & Plan Note (Signed)
Bilateral LE edema - ddx includes HF, nephrotic syndrome, venous stasis.  With bilateral nature and fluctuating degree of swelling, low suspicion for VTE. EF 50-55% in 2020 Will trial increased dose of HCTZ -> if this doesn't work, consider transition to lasix Normal creatinine, normal LFT's, follow UA for proteinuria.  Follow BNP. Given return precautions.

## 2020-03-26 NOTE — Assessment & Plan Note (Addendum)
BP above goal Will increase HCTZ to 25 mg daily Follow at next visit  Repeat labs in 2-4 weeks

## 2020-03-26 NOTE — Patient Instructions (Addendum)
Increase your HCTZ to 25 mg daily.  Let us know if your swelling improves with this.  Increase your basaglar to 40 units.  Check your blood sugar before meals and keep Lillymae Duet log.  Quitting smoking is extremely important.  Let us know when you're ready.

## 2020-03-27 ENCOUNTER — Telehealth: Payer: Self-pay | Admitting: Pharmacy Technician

## 2020-03-27 LAB — URINALYSIS
Bilirubin, UA: NEGATIVE
Glucose, UA: NEGATIVE
Ketones, UA: NEGATIVE
Leukocytes,UA: NEGATIVE
Nitrite, UA: NEGATIVE
Protein,UA: NEGATIVE
RBC, UA: NEGATIVE
Specific Gravity, UA: 1.018 (ref 1.005–1.030)
Urobilinogen, Ur: 1 mg/dL (ref 0.2–1.0)
pH, UA: 6.5 (ref 5.0–7.5)

## 2020-03-27 LAB — BRAIN NATRIURETIC PEPTIDE: BNP: 11.2 pg/mL (ref 0.0–100.0)

## 2020-03-27 LAB — TSH: TSH: 2.92 u[IU]/mL (ref 0.450–4.500)

## 2020-03-27 NOTE — Telephone Encounter (Signed)
Received updated proof of income.  Patient eligible to receive medication assistance at [facility] until time for re-certification in 2022, and as long as eligibility requirements continue to be met. ° °Betty J. Kluttz °Care Manager °[facility] °

## 2020-04-09 ENCOUNTER — Ambulatory Visit: Payer: Medicaid Other | Admitting: Unknown Physician Specialty

## 2020-04-09 ENCOUNTER — Other Ambulatory Visit: Payer: Self-pay

## 2020-04-09 DIAGNOSIS — G4733 Obstructive sleep apnea (adult) (pediatric): Secondary | ICD-10-CM

## 2020-04-09 DIAGNOSIS — I10 Essential (primary) hypertension: Secondary | ICD-10-CM

## 2020-04-09 DIAGNOSIS — E1165 Type 2 diabetes mellitus with hyperglycemia: Secondary | ICD-10-CM

## 2020-04-09 NOTE — Assessment & Plan Note (Signed)
Home readings are stable.  Continue present medications

## 2020-04-09 NOTE — Progress Notes (Signed)
BP (!) 148/89 (BP Location: Right Arm, Patient Position: Sitting, Cuff Size: Large)    Pulse 78    Temp 98.4 F (36.9 C)    Ht 5\' 6"  (1.676 m)    Wt (!) 369 lb 12.8 oz (167.7 kg)    SpO2 93%    BMI 59.69 kg/m    Subjective:    Patient ID: , male    DOB: Nov 08, 1970, 49 y.o.   MRN: [id number]  HPI: Luis [name] is a 49 y.o. male  Chief Complaint  Patient presents with   Follow-up    lab work fu   Hypertension Last visit increased HCTZ.  Pt states it helped swelling.   Average home BPs - states they are good but can't remember numbers   No problems or lightheadedness No chest pain with exertion or shortness of breath   Diabetes Basaglar increased to 40 units last visit.  Fasting blood sugars 140 and lower.  Evening BS is around 170.    Relevant past medical, surgical, family and social history reviewed and updated as indicated. Interim medical history since our last visit reviewed. Allergies and medications reviewed and updated.  Review of Systems  Per HPI unless specifically indicated above     Objective:    BP (!) 148/89 (BP Location: Right Arm, Patient Position: Sitting, Cuff Size: Large)    Pulse 78    Temp 98.4 F (36.9 C)    Ht 5\' 6"  (1.676 m)    Wt (!) 369 lb 12.8 oz (167.7 kg)    SpO2 93%    BMI 59.69 kg/m   Wt Readings from Last 3 Encounters:  04/09/20 (!) 369 lb 12.8 oz (167.7 kg)  03/26/20 (!) 375 lb (170.1 kg)  03/19/20 (!) 367 lb 4.8 oz (166.6 kg)    Physical Exam Constitutional:      General: He is not in acute distress.    Appearance: He is well-developed. He is obese.  HENT:     Head: Normocephalic and atraumatic.  Eyes:     General: Lids are normal. No scleral icterus.       Right eye: No discharge.        Left eye: No discharge.     Conjunctiva/sclera: Conjunctivae normal.  Neck:     Vascular: No carotid bruit or JVD.  Cardiovascular:     Rate and Rhythm: Normal rate and regular rhythm.     Heart sounds: Normal heart  sounds.  Pulmonary:     Effort: Pulmonary effort is normal. No respiratory distress.     Breath sounds: Normal breath sounds.  Abdominal:     Palpations: There is no hepatomegaly or splenomegaly.  Musculoskeletal:        General: Normal range of motion.     Cervical back: Normal range of motion and neck supple.  Skin:    General: Skin is warm and dry.     Coloration: Skin is not pale.     Findings: No rash.  Neurological:     Mental Status: He is alert and oriented to person, place, and time.  Psychiatric:        Behavior: Behavior normal.        Thought Content: Thought content normal.        Judgment: Judgment normal.     Results for orders placed or performed in visit on 03/26/20  Urinalysis  Result Value Ref Range   Specific Gravity, UA 1.018 1.005 - 1.030   pH,  UA 6.5 5.0 - 7.5   Color, UA Yellow Yellow   Appearance Ur Clear Clear   Leukocytes,UA Negative Negative   Protein,UA Negative Negative/Trace   Glucose, UA Negative Negative   Ketones, UA Negative Negative   RBC, UA Negative Negative   Bilirubin, UA Negative Negative   Urobilinogen, Ur 1.0 0.2 - 1.0 mg/dL   Nitrite, UA Negative Negative  TSH  Result Value Ref Range   TSH 2.920 0.450 - 4.500 uIU/mL  B Nat Peptide  Result Value Ref Range   BNP 11.2 0.0 - 100.0 pg/mL      Assessment & Plan:   Problem List Items Addressed This Visit      Unprioritized   Hypertension    Home readings are stable.  Continue present medications      OSA (obstructive sleep apnea)    Working on neurology assessment      Uncontrolled type 2 diabetes mellitus (HCC)    Fasting BS seem better at around 140.  Post prandial around 170.  Will check Hgb A1C in 2 months          Follow up plan: Return in about 2 months (around 06/09/2020).

## 2020-04-09 NOTE — Assessment & Plan Note (Signed)
Fasting BS seem better at around 140.  Post prandial around 170.  Will check Hgb A1C in 2 months

## 2020-04-09 NOTE — Assessment & Plan Note (Signed)
Working on neurology assessment

## 2020-04-16 ENCOUNTER — Telehealth: Payer: Self-pay | Admitting: Family Medicine

## 2020-04-16 NOTE — Telephone Encounter (Signed)
Patient is concerned because his blood glucose is in the 80s after eating dinner. He states that he recently had his insulin increased and feels that it is too much. Advised patient to decrease insulin to 35 units and eat a small protein snack before bed. Continue with all other medications.

## 2020-04-29 ENCOUNTER — Telehealth: Payer: Self-pay | Admitting: Family Medicine

## 2020-04-29 ENCOUNTER — Other Ambulatory Visit: Payer: Self-pay

## 2020-04-29 DIAGNOSIS — IMO0002 Reserved for concepts with insufficient information to code with codable children: Secondary | ICD-10-CM

## 2020-04-29 MED ORDER — METFORMIN HCL 1000 MG PO TABS: 1000.0000 mg | ORAL_TABLET | Freq: Two times a day (BID) | ORAL | 0 refills | Status: DC

## 2020-04-29 NOTE — Telephone Encounter (Signed)
Pt called stating he is out of metformin medication (1000 mg). Pt stated that med management told him to call us. Please advise. MD 9/2 @ 2:55

## 2020-05-06 ENCOUNTER — Other Ambulatory Visit: Payer: Medicaid Other

## 2020-05-08 ENCOUNTER — Encounter: Payer: Self-pay | Admitting: Emergency Medicine

## 2020-05-08 ENCOUNTER — Telehealth: Payer: Self-pay | Admitting: Physician Assistant

## 2020-05-08 ENCOUNTER — Ambulatory Visit
Admission: EM | Admit: 2020-05-08 | Discharge: 2020-05-08 | Disposition: A | Discharge status: Home / Self Care | Payer: Medicaid Other | Attending: Physician Assistant | Admitting: Physician Assistant

## 2020-05-08 ENCOUNTER — Other Ambulatory Visit: Payer: Self-pay

## 2020-05-08 DIAGNOSIS — L304 Erythema intertrigo: Secondary | ICD-10-CM | POA: Insufficient documentation

## 2020-05-08 DIAGNOSIS — L03313 Cellulitis of chest wall: Secondary | ICD-10-CM

## 2020-05-08 HISTORY — DX: Other psychoactive substance dependence, in remission: F19.21

## 2020-05-08 MED ORDER — CLOTRIMAZOLE-BETAMETHASONE 1-0.05 % EX CREA: TOPICAL_CREAM | CUTANEOUS | 0 refills | Status: AC

## 2020-05-08 MED ORDER — FLUCONAZOLE 150 MG PO TABS
150.0000 mg | ORAL_TABLET | ORAL | 0 refills | Status: AC
Start: 2020-05-08 — End: 2020-05-15

## 2020-05-08 MED ORDER — DOXYCYCLINE HYCLATE 100 MG PO CAPS: 100.0000 mg | ORAL_CAPSULE | Freq: Two times a day (BID) | ORAL | 0 refills | Status: AC

## 2020-05-08 NOTE — ED Triage Notes (Signed)
Patient in today c/o redness, irritation and draining of area under his right breast x 2 days. Patient has been putting a cream that his mother used after getting a toe amputated. Patient does not know the name of the cream.

## 2020-05-08 NOTE — Telephone Encounter (Signed)
Sent lotrisone  

## 2020-05-08 NOTE — Discharge Instructions (Addendum)
Your rash is consistent with intertrigo which is a fungal infection.  There is likely secondary bacterial infection.  Begin Diflucan as well as doxycycline.  Keep area clean and dry.  You can apply the ointment that you already have as needed.  Follow-up if you have any worsening of the redness, if you develop fever, if you develop pain, etc.  We have cultured the wound and will call with results in a couple of days in case we have to change treatment.

## 2020-05-08 NOTE — ED Provider Notes (Signed)
[facility]    CSN: [id number] Arrival date & time: 05/08/20  1215      History   Chief Complaint Chief Complaint  Patient presents with  . Cellulitis    HPI Luis [name] is a 49 y.o. male.   Patient presenting for redness and irritation of the right chest x 2 days.  He says the area has been draining fluid.  Patient states he has been using antibiotic ointment with some improvement.  Patient denies fevers.  Patient states his daughter scared him about possible MRSA infection so that is why he came in today.  Patient denies any pain associated with the area of irritation.  He also denies any itching.  He says the fluid that drained was clear. Patient's medical history is significant for morbid obesity, diabetes, hypertension, hyperlipidemia, COPD, and CAD.  No other concerns today.  HPI  Past Medical History:  Diagnosis Date  . Back pain   . Diabetes mellitus without complication (HCC)   . Drug addiction in remission (HCC)   . High cholesterol   . Hypertension   . Neuropathy     Patient Active Problem List   Diagnosis Date Noted  . Uncontrolled type 2 diabetes mellitus (HCC) 03/26/2020  . Bilateral lower extremity edema 03/26/2020  . ACS (acute coronary syndrome) (HCC) 05/20/2019  . Dyslipidemia 07/06/2018  . GERD (gastroesophageal reflux disease) 07/05/2018  . Obesity, unspecified 06/19/2017  . Hypertension 12/01/2016  . Controlled type 2 diabetes mellitus with complication, with long-term current use of insulin (HCC) 12/01/2016  . 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 OSA (obstructive sleep apnea) 05/06/2013  . Chronic back pain 12/25/2011    Past Surgical History:  Procedure Laterality Date  . KNEE SURGERY Right   . LEFT HEART CATH AND CORONARY ANGIOGRAPHY N/A 05/21/2019   Procedure: LEFT HEART CATH AND CORONARY ANGIOGRAPHY;  Surgeon: Marcina Millard, MD;  Location: [facility] INVASIVE CV LAB;  Service: Cardiovascular;   Laterality: N/A;       Home Medications    Prior to Admission medications   Medication Sig Start Date End Date Taking? Authorizing Provider  aspirin EC 81 MG EC tablet Take 1 tablet (81 mg total) by mouth daily. 05/23/19  Yes Enedina Finner, MD  atorvastatin (LIPITOR) 40 MG tablet Take 2 tablets (80 mg total) by mouth daily. 03/26/20 05/08/20 Yes Zigmund Daniel., MD  ezetimibe (ZETIA) 10 MG tablet Take 1 tablet (10 mg total) by mouth daily. 08/08/19  Yes Mike Gip, FNP  gabapentin (NEURONTIN) 300 MG capsule TAKE 3 CAPSULES BY MOUTH EVERY MORNING, 3 CAPSULES MID DAY AND 3 CAPSULES EVERY EVENING 03/17/20  Yes Mike Gip, FNP  glimepiride (AMARYL) 4 MG tablet TAKE ONE TABLET BY MOUTH EVERY DAY WITH BREAKFAST 03/17/20  Yes Mike Gip, FNP  hydrochlorothiazide (MICROZIDE) 12.5 MG capsule Take 2 capsules (25 mg total) by mouth daily. 03/26/20 05/08/20 Yes Zigmund Daniel., MD  ibuprofen (ADVIL,MOTRIN) 200 MG tablet Take 200-400 mg by mouth every 6 (six) hours as needed for fever or mild pain.    Yes [provider]  Insulin Glargine (BASAGLAR KWIKPEN) 100 UNIT/ML Inject 0.4 mLs (40 Units total) into the skin at bedtime. 03/26/20 06/24/20 Yes Zigmund Daniel., MD  lisinopril (ZESTRIL) 20 MG tablet Take 1 tablet (20 mg total) by mouth daily. 02/20/20  Yes Mike Gip, FNP  metFORMIN (GLUCOPHAGE) 1000 MG tablet Take 1 tablet (1,000 mg total) by mouth 2 (two) times daily. 04/29/20 06/28/20  Yes Iloabachie, Chioma E, NP  methadone (DOLOPHINE) 10 MG/5ML solution Take 140 mg by mouth daily.    Yes [provider]  metoprolol tartrate (LOPRESSOR) 25 MG tablet Take 1 tablet (25 mg total) by mouth 2 (two) times daily. 03/26/20 05/08/20 Yes Zigmund Daniel., MD  doxycycline (VIBRAMYCIN) 100 MG capsule Take 1 capsule (100 mg total) by mouth 2 (two) times daily for 7 days. 05/08/20 05/15/20  Shirlee Latch, PA-C  fluconazole (DIFLUCAN) 150 MG tablet Take 1 tablet (150 mg total)  by mouth every 3 (three) days for 7 days. 05/08/20 05/15/20  Eusebio Friendly B, PA-C  glucose blood ([facility] GLUCOSE METER TEST STRIPS) test strip Use as instructed 03/26/20   Zigmund Daniel., MD  Lancets (FREESTYLE) lancets Use as instructed 03/26/20   Zigmund Daniel., MD    Family History Family History  Problem Relation Age of Onset  . Cancer Mother   . Heart disease Father   . Diabetes Paternal Grandfather     Social History Social History   Tobacco Use  . Smoking status: Current Every Day Smoker    Packs/day: 1.00    Years: 35.00    Pack years: 35.00    Types: Cigarettes  . Smokeless tobacco: Never Used  Vaping Use  . Vaping Use: Never used  Substance Use Topics  . Alcohol use: Yes    Alcohol/week: 1.0 - 2.0 standard drink    Types: 1 - 2 Cans of beer per week  . Drug use: Not Currently    Types: Marijuana, Methamphetamines    Comment: quit 2000     Allergies   Patient has no known allergies.   Review of Systems Review of Systems  Constitutional: Negative for fatigue and fever.  Respiratory: Negative for shortness of breath.   Cardiovascular: Negative for chest pain.  Gastrointestinal: Negative for nausea and vomiting.  Musculoskeletal: Negative for arthralgias, back pain, joint swelling and myalgias.  Skin: Positive for color change. Negative for rash.  Neurological: Negative for weakness.     Physical Exam Triage Vital Signs ED Triage Vitals [05/08/20 1317]  Enc Vitals Group     BP 135/77     Pulse Rate 82     Resp 18     Temp 98.1 F (36.7 C)     Temp Source Oral     SpO2 97 %     Weight (!) 360 lb (163.3 kg)     Height 5\' 10"  (1.778 m)     Head Circumference      Peak Flow      Pain Score 0     Pain Loc      Pain Edu?      Excl. in GC?    No data found.  Updated Vital Signs BP 135/77 (BP Location: Left Arm)   Pulse 82   Temp 98.1 F (36.7 C) (Oral)   Resp 18   Ht 5\' 10"  (1.778 m)   Wt (!) 360 lb (163.3 kg)   SpO2 97%   BMI  51.65 kg/m       Physical Exam Vitals and nursing note reviewed.  Constitutional:      General: He is not in acute distress.    Appearance: Normal appearance. He is well-developed. He is obese. He is not toxic-appearing or diaphoretic.  HENT:     Head: Normocephalic and atraumatic.  Eyes:     General: No scleral icterus.    Conjunctiva/sclera: Conjunctivae normal.  Cardiovascular:     Rate and Rhythm: Normal rate and regular rhythm.     Heart sounds: Normal heart sounds. No murmur heard.   Pulmonary:     Effort: Pulmonary effort is normal. No respiratory distress.     Breath sounds: Normal breath sounds.  Musculoskeletal:     Cervical back: Neck supple.  Skin:    General: Skin is warm and dry.     Comments: Under the right breast, there is a maculopapular erythematous rash that is warm.  It is not tender to touch.  There is 1 small superficial ulcer that is draining a small amount of clear fluid.  I have swabbed out for culture.  Neurological:     General: No focal deficit present.     Mental Status: He is alert. Mental status is at baseline.     Motor: No weakness.     Gait: Gait normal.  Psychiatric:        Mood and Affect: Mood normal.        Behavior: Behavior normal.        Thought Content: Thought content normal.      UC Treatments / Results  Labs (all labs ordered are listed, but only abnormal results are displayed) Labs Reviewed  AEROBIC/ANAEROBIC CULTURE (SURGICAL/DEEP WOUND)    EKG   Radiology No results found.  Procedures Procedures (including critical care time)  Medications Ordered in UC Medications - No data to display  Initial Impression / Assessment and Plan / UC Course  I have reviewed the triage vital signs and the nursing notes.  Pertinent labs & imaging results that were available during my care of the patient were reviewed by me and considered in my medical decision making (see chart for details).   Culture taken.  At this time  treating patient for suspected intertrigo with secondary bacterial cellulitis.  Advised him to follow-up with our clinic or PCP as needed.  He should certainly follow-up if he has any worsening of symptoms or fever.   Final Clinical Impressions(s) / UC Diagnoses   Final diagnoses:  Intertrigo  Cellulitis of chest wall     Discharge Instructions     Your rash is consistent with intertrigo which is a fungal infection.  There is likely secondary bacterial infection.  Begin Diflucan as well as doxycycline.  Keep area clean and dry.  You can apply the ointment that you already have as needed.  Follow-up if you have any worsening of the redness, if you develop fever, if you develop pain, etc.  We have cultured the wound and will call with results in a couple of days in case we have to change treatment.    ED Prescriptions    Medication Sig Dispense Auth. Provider   doxycycline (VIBRAMYCIN) 100 MG capsule Take 1 capsule (100 mg total) by mouth 2 (two) times daily for 7 days. 14 capsule Eusebio Friendly B, PA-C   fluconazole (DIFLUCAN) 150 MG tablet Take 1 tablet (150 mg total) by mouth every 3 (three) days for 7 days. 2 tablet Gareth Morgan     PDMP not reviewed this encounter.   Shirlee Latch, PA-C 05/08/20 1343

## 2020-05-12 LAB — AEROBIC/ANAEROBIC CULTURE W GRAM STAIN (SURGICAL/DEEP WOUND)

## 2020-05-19 ENCOUNTER — Other Ambulatory Visit: Payer: Self-pay

## 2020-05-19 DIAGNOSIS — E119 Type 2 diabetes mellitus without complications: Secondary | ICD-10-CM

## 2020-05-19 MED ORDER — GABAPENTIN 300 MG PO CAPS: ORAL_CAPSULE | ORAL | 0 refills | Status: DC

## 2020-05-28 ENCOUNTER — Other Ambulatory Visit: Payer: Self-pay | Admitting: Gerontology

## 2020-05-28 DIAGNOSIS — IMO0002 Reserved for concepts with insufficient information to code with codable children: Secondary | ICD-10-CM

## 2020-06-08 ENCOUNTER — Ambulatory Visit: Payer: Medicaid Other | Admitting: Pharmacist

## 2020-06-08 ENCOUNTER — Encounter: Payer: Self-pay | Admitting: Pharmacist

## 2020-06-08 DIAGNOSIS — Z79899 Other long term (current) drug therapy: Secondary | ICD-10-CM

## 2020-06-08 NOTE — Progress Notes (Addendum)
Medication Management Clinic Visit Note  Patient: Luis [name] MRN: [id number] Date of Birth: 12/07/1970 PCP: Zachery Dauer, FNP  [name] 49 y.o. male presents for a telephone medication management visit today with the student pharmacist. The patient did not have concerns about his current medications. He did mention that his stomach has been bothering him lately and was unsure if he should be on Zetia.   There were no vitals taken for this visit.  Patient Information   Past Medical History:  Diagnosis Date   Back pain    Diabetes mellitus without complication (HCC)    Drug addiction in remission (HCC)    High cholesterol    Hypertension    Knee pain    Neuropathy       Past Surgical History:  Procedure Laterality Date   KNEE SURGERY Right    LEFT HEART CATH AND CORONARY ANGIOGRAPHY N/A 05/21/2019   Procedure: LEFT HEART CATH AND CORONARY ANGIOGRAPHY;  Surgeon: Marcina Millard, MD;  Location: [facility] INVASIVE CV LAB;  Service: Cardiovascular;  Laterality: N/A;     Family History  Problem Relation Age of Onset   Cancer Mother    Heart disease Mother    Heart disease Father    Diabetes Paternal Grandfather    Heart disease Brother    New Diagnoses (since last visit): Knee pain  Lifestyle/Exericise: Limited by back pain. He walks and does yard work a few times a week.   Diet: Breakfast: Eggs, bacon Lunch: Sandwich, soup Dinner: He said he tried to eat healthy; stir fry (zucchini, onion, chicken) Drinks: Water, zero (few times a week) Snacks: Peanut butter crackers, ritz        Social History   Substance and Sexual Activity  Alcohol Use Not Currently   Alcohol/week: 1.0 - 2.0 standard drink   Types: 1 - 2 Cans of beer per week     Social History   Tobacco Use  Smoking Status Current Every Day Smoker   Packs/day: 1.00   Years: 35.00   Pack years: 35.00   Types: Cigarettes  Smokeless Tobacco Never Used    Health Maintenance  Topic Date  Due   Hepatitis C Screening  Never done   FOOT EXAM  Never done   OPHTHALMOLOGY EXAM  Never done   COVID-19 Vaccine (1) Never done   HIV Screening  Never done   TETANUS/TDAP  Never done   INFLUENZA VACCINE  03/22/2020   HEMOGLOBIN A1C  09/18/2020   PNEUMOCOCCAL POLYSACCHARIDE VACCINE AGE 66-64 HIGH RISK  Completed   Health Maintenance/Date Completed  Last ED visit: Yes; last month Last Visit to PCP: Yes; 9/28 Next Visit to PCP: Yes; 10/26 Specialist Visit: Yes; cardiology per patient Dental Exam: No Eye Exam: No; said been trying to through [facility] Colonoscopy: No Flu Vaccine: No; interested Pneumonia Vaccine: Yes; said he got it when he was in the hospital COVID-19 Vaccine: No; still deciding Shingrix Vaccine: No; already had chickenpox and shingles when he was 49 years old  Outpatient Encounter Medications as of 06/08/2020  Medication Sig   aspirin EC 81 MG EC tablet Take 1 tablet (81 mg total) by mouth daily.   atorvastatin (LIPITOR) 80 MG tablet Take 80 mg by mouth daily.   clopidogrel (PLAVIX) 75 MG tablet Take 1 tablet by mouth daily.   gabapentin (NEURONTIN) 300 MG capsule TAKE 3 CAPSULES BY MOUTH EVERY MORNING, 3 CAPSULES MID DAY AND 3 CAPSULES EVERY EVENING   glimepiride (AMARYL) 4 MG  tablet TAKE ONE TABLET BY MOUTH EVERY DAY WITH BREAKFAST   glucose blood ([facility] GLUCOSE METER TEST STRIPS) test strip Use as instructed   ibuprofen (ADVIL,MOTRIN) 200 MG tablet Take 200-400 mg by mouth every 6 (six) hours as needed for fever or mild pain.    Insulin Glargine (BASAGLAR KWIKPEN) 100 UNIT/ML Inject 0.4 mLs (40 Units total) into the skin at bedtime.   Lancets (FREESTYLE) lancets Use as instructed   lisinopril (ZESTRIL) 20 MG tablet Take 1 tablet (20 mg total) by mouth daily.   metFORMIN (GLUCOPHAGE) 1000 MG tablet Take 1 tablet (1,000 mg total) by mouth 2 (two) times daily.   methadone (DOLOPHINE) 10 MG/5ML solution Take 140 mg by mouth daily.    metoprolol tartrate (LOPRESSOR) 25  MG tablet Take 25 mg by mouth 2 (two) times daily.   ezetimibe (ZETIA) 10 MG tablet Take 1 tablet (10 mg total) by mouth daily.   hydrochlorothiazide (MICROZIDE) 12.5 MG capsule Take 2 capsules (25 mg total) by mouth daily.   [DISCONTINUED] atorvastatin (LIPITOR) 40 MG tablet Take 1 tablet (40 mg total) by mouth daily. (Patient taking differently: Take 80 mg by mouth daily. )   [DISCONTINUED] atorvastatin (LIPITOR) 40 MG tablet Take 2 tablets (80 mg total) by mouth daily.   [DISCONTINUED] clopidogrel (PLAVIX) 75 MG tablet Take 1 tablet (75 mg total) by mouth daily. (Patient not taking: Reported on 01/23/2020)   [DISCONTINUED] gabapentin (NEURONTIN) 300 MG capsule Take 3 capsules in the morning, 3 capsules mid day and 3 capsules in the evening   [DISCONTINUED] glimepiride (AMARYL) 4 MG tablet TAKE ONE TABLET BY MOUTH EVERY DAY WITH BREAKFAST   [DISCONTINUED] hydrochlorothiazide (MICROZIDE) 12.5 MG capsule Take 1 capsule (12.5 mg total) by mouth daily.   [DISCONTINUED] Insulin Glargine (BASAGLAR KWIKPEN) 100 UNIT/ML Inject 0.35 mLs (35 Units total) into the skin at bedtime.   [DISCONTINUED] lisinopril (ZESTRIL) 20 MG tablet Take 1 tablet (20 mg total) by mouth daily.   [DISCONTINUED] metFORMIN (GLUCOPHAGE) 1000 MG tablet Take 1 tablet (1,000 mg total) by mouth 2 (two) times daily.   [DISCONTINUED] metoprolol tartrate (LOPRESSOR) 25 MG tablet Take 1 tablet (25 mg total) by mouth 2 (two) times daily.   [DISCONTINUED] metoprolol tartrate (LOPRESSOR) 25 MG tablet Take 1 tablet (25 mg total) by mouth 2 (two) times daily.   No facility-administered encounter medications on file as of 06/08/2020.   Assessment and Plan:  T2DM: glimepiride, Basaglar, metformin Patient reported no concerns about current regiment. He reported fasting average readings of 120s in the morning. When asked about recent lows, he talked about one low he had in the past. He said that was the reason why he was decreased from 40 units to  35 units of the Basaglar. His last A1c 2 months ago was 10.2.   We discussed what his A1c was, what it meant, and ways to decrease the number. Patient verbalized understanding the importance of decreasing his sugars, what to do when he gets slow, and the plate method.  Plan: Continue regimen and recheck A1c in 1 month if possible  Cholesterol: atorvastatin Patient reports no concerns about regiment. Patient denied muscle pain. Patient was unsure if he should still be on Zetia.  Plan: Continue and follow-up if patient is suppose to be on Zetia  Hypertension: Metoprolol tartrate, lisinopril, HCTZ Patient reported no concerns about current regimen. He doesn't check his BP at home, because he doesn't have a machine. He didn't have any questions and denied dizziness.  Plan:  Continue  Neuropathy: Gabapentin Patient endorses nerve pain in his legs and hands. He verbalized his understanding that his sugars could be a cause of his neuropathy.  Plan: Continue  Adherence:  Patient reports barely missing. He says he has a schedule of taking his medications at breakfast and at supper. He doesn't use a pill box.  Plan: Follow up on adherence  Otila Kluver, Student-PharmD [employer] [facility]

## 2020-06-10 ENCOUNTER — Other Ambulatory Visit: Payer: Self-pay

## 2020-06-16 ENCOUNTER — Ambulatory Visit: Payer: Medicaid Other

## 2020-06-17 ENCOUNTER — Ambulatory Visit: Payer: Medicaid Other | Admitting: Adult Health

## 2020-06-18 ENCOUNTER — Other Ambulatory Visit: Payer: Self-pay | Admitting: Gerontology

## 2020-06-18 DIAGNOSIS — E119 Type 2 diabetes mellitus without complications: Secondary | ICD-10-CM

## 2020-06-18 DIAGNOSIS — E1142 Type 2 diabetes mellitus with diabetic polyneuropathy: Secondary | ICD-10-CM

## 2020-06-18 MED ORDER — GABAPENTIN 300 MG PO CAPS: ORAL_CAPSULE | ORAL | 0 refills | Status: DC

## 2020-06-23 ENCOUNTER — Ambulatory Visit: Payer: Medicaid Other | Admitting: Physician Assistant

## 2020-06-23 ENCOUNTER — Other Ambulatory Visit: Payer: Self-pay

## 2020-06-23 VITALS — BP 111/73 | HR 90 | Temp 98.3°F | Ht 68.0 in | Wt 365.2 lb

## 2020-06-23 DIAGNOSIS — E1142 Type 2 diabetes mellitus with diabetic polyneuropathy: Secondary | ICD-10-CM

## 2020-06-23 DIAGNOSIS — E1169 Type 2 diabetes mellitus with other specified complication: Secondary | ICD-10-CM

## 2020-06-23 DIAGNOSIS — Z716 Tobacco abuse counseling: Secondary | ICD-10-CM

## 2020-06-23 MED ORDER — GABAPENTIN 300 MG PO CAPS: ORAL_CAPSULE | ORAL | 0 refills | Status: DC

## 2020-06-23 NOTE — Progress Notes (Signed)
Patient: Luis [name] Male    DOB: [birth year]   49 y.o.   MRN: [id number] Visit Date: 06/23/2020  Today's Provider: Mariane Duval, PA-C   Chief Complaint  Patient presents with  . Follow-up   Subjective:    Pt presents for routine f/u, reports feeling well and has no concerns or complaints.   Needs a refill on his gabapentin; otherwise doesn't need refills on anything right now.   Needs labs, but there seems to have been a mixup regarding getting his labs prior to his f/u visit. He reports having called here regarding pre-visit labs, but he was told they had no orders for pre-visit labs.   He says his blood pressure is "about what [we] got here" whenever he has it measured, but he doesn't measure it regularly outside of visits. He reports full adherence with medications, and reports glucose readings consistently in the 110s to 120s (suggesting a notable improvement from his last A1c on record here, 10.2 in July of this year). He had one instance of a glucose reading at 82, but that's the lowest he's ever had, and he notes even that has only happened once without incident.   He is still smoking and is not ready to quit or to talk about quitting (e.g., says we could write a prescription, but he wouldn't fill it).    No Known Allergies Previous Medications   ASPIRIN EC 81 MG EC TABLET    Take 1 tablet (81 mg total) by mouth daily.   ATORVASTATIN (LIPITOR) 80 MG TABLET    Take 80 mg by mouth daily.   CLOPIDOGREL (PLAVIX) 75 MG TABLET    Take 1 tablet by mouth daily.   EZETIMIBE (ZETIA) 10 MG TABLET    Take 1 tablet (10 mg total) by mouth daily.   GLIMEPIRIDE (AMARYL) 4 MG TABLET    TAKE ONE TABLET BY MOUTH EVERY DAY WITH BREAKFAST   GLUCOSE BLOOD ([facility]S GLUCOSE METER TEST STRIPS) TEST STRIP    Use as instructed   HYDROCHLOROTHIAZIDE (MICROZIDE) 12.5 MG CAPSULE    Take 2 capsules (25 mg total) by mouth daily.   IBUPROFEN (ADVIL,MOTRIN) 200 MG TABLET    Take 200-400 mg by mouth every 6  (six) hours as needed for fever or mild pain.    INSULIN GLARGINE (BASAGLAR KWIKPEN) 100 UNIT/ML    Inject 0.4 mLs (40 Units total) into the skin at bedtime.   LANCETS (FREESTYLE) LANCETS    Use as instructed   LISINOPRIL (ZESTRIL) 20 MG TABLET    Take 1 tablet (20 mg total) by mouth daily.   METFORMIN (GLUCOPHAGE) 1000 MG TABLET    Take 1 tablet (1,000 mg total) by mouth 2 (two) times daily.   METHADONE (DOLOPHINE) 10 MG/5ML SOLUTION    Take 140 mg by mouth daily.    METOPROLOL TARTRATE (LOPRESSOR) 25 MG TABLET    Take 25 mg by mouth 2 (two) times daily.   See HPI. Additional ROS was deferred today given his reports in HPI (specifically, reporting feeling well and having no concerns or complaints) and plan to bring him back to clinic in the next 2-3 weeks after he's had labs.  Social History   Tobacco Use  . Smoking status: Current Every Day Smoker    Packs/day: 1.00    Years: 35.00    Pack years: 35.00    Types: Cigarettes  . Smokeless tobacco: Never Used  Substance Use Topics  . Alcohol use: Not Currently  Alcohol/week: 1.0 - 2.0 standard drink    Types: 1 - 2 Cans of beer per week   Objective:   BP 111/73   Pulse 90   Temp 98.3 F (36.8 C)   Ht 5\' 8"  (1.727 m)   Wt (!) 365 lb 3.2 oz (165.7 kg)   SpO2 95%   BMI 55.53 kg/m   Physical exam Constitutional: A&Ox3, NAD, pleasant, cooperative HEENT: [place]/AT Respiratory: breathing unlabored, good and symmetric chest expansion; lung sounds distant and auscultation reliability reduced d/t habitus, but no concerns about air entry, and lungs seem clear to auscultation bilaterally Cardiovascular: auscultation reliability reduced d/t habitus, but RRR, S1, S2; no S3 or S4 appreciated; no murmurs (despite mild MR noted on 2020 echo), clicks, rubs heard      Assessment & Plan:   DM II, HTN, and HLD -Reports of glucose readings at home would suggest considerable improvement in A1c from last measurement in July of this year (when it  was 10.2); A1c ordered w/ labs today will help give further insight and hopeful consistency -Need labs and f/u visit after labs; ordered today -Patient agreed to have labs done in next week and then return for f/u in 2-3 weeks -Will refill gabapentin for 30-day supply  Tobacco use -Appreciate pt's honesty about not being ready to quit -Still discussed need to quit, which he expresses understanding; gave counseling on benefits of quitting and risks of continuing to smoke, and advised we're here to help however we can whenever he's ready (he said [facility] will be "the first people [he] call[s]" when he's ready  [facility], PA-C   [facility]

## 2020-06-25 ENCOUNTER — Other Ambulatory Visit: Payer: Medicaid Other

## 2020-06-28 IMAGING — DX DG CHEST 1V PORT
1 series · 1 of 1 positions shown · non-contrast
Comparison: 04/15/2010

CLINICAL DATA: Chest pain

EXAM:
PORTABLE CHEST 1 VIEW

[chest ap]
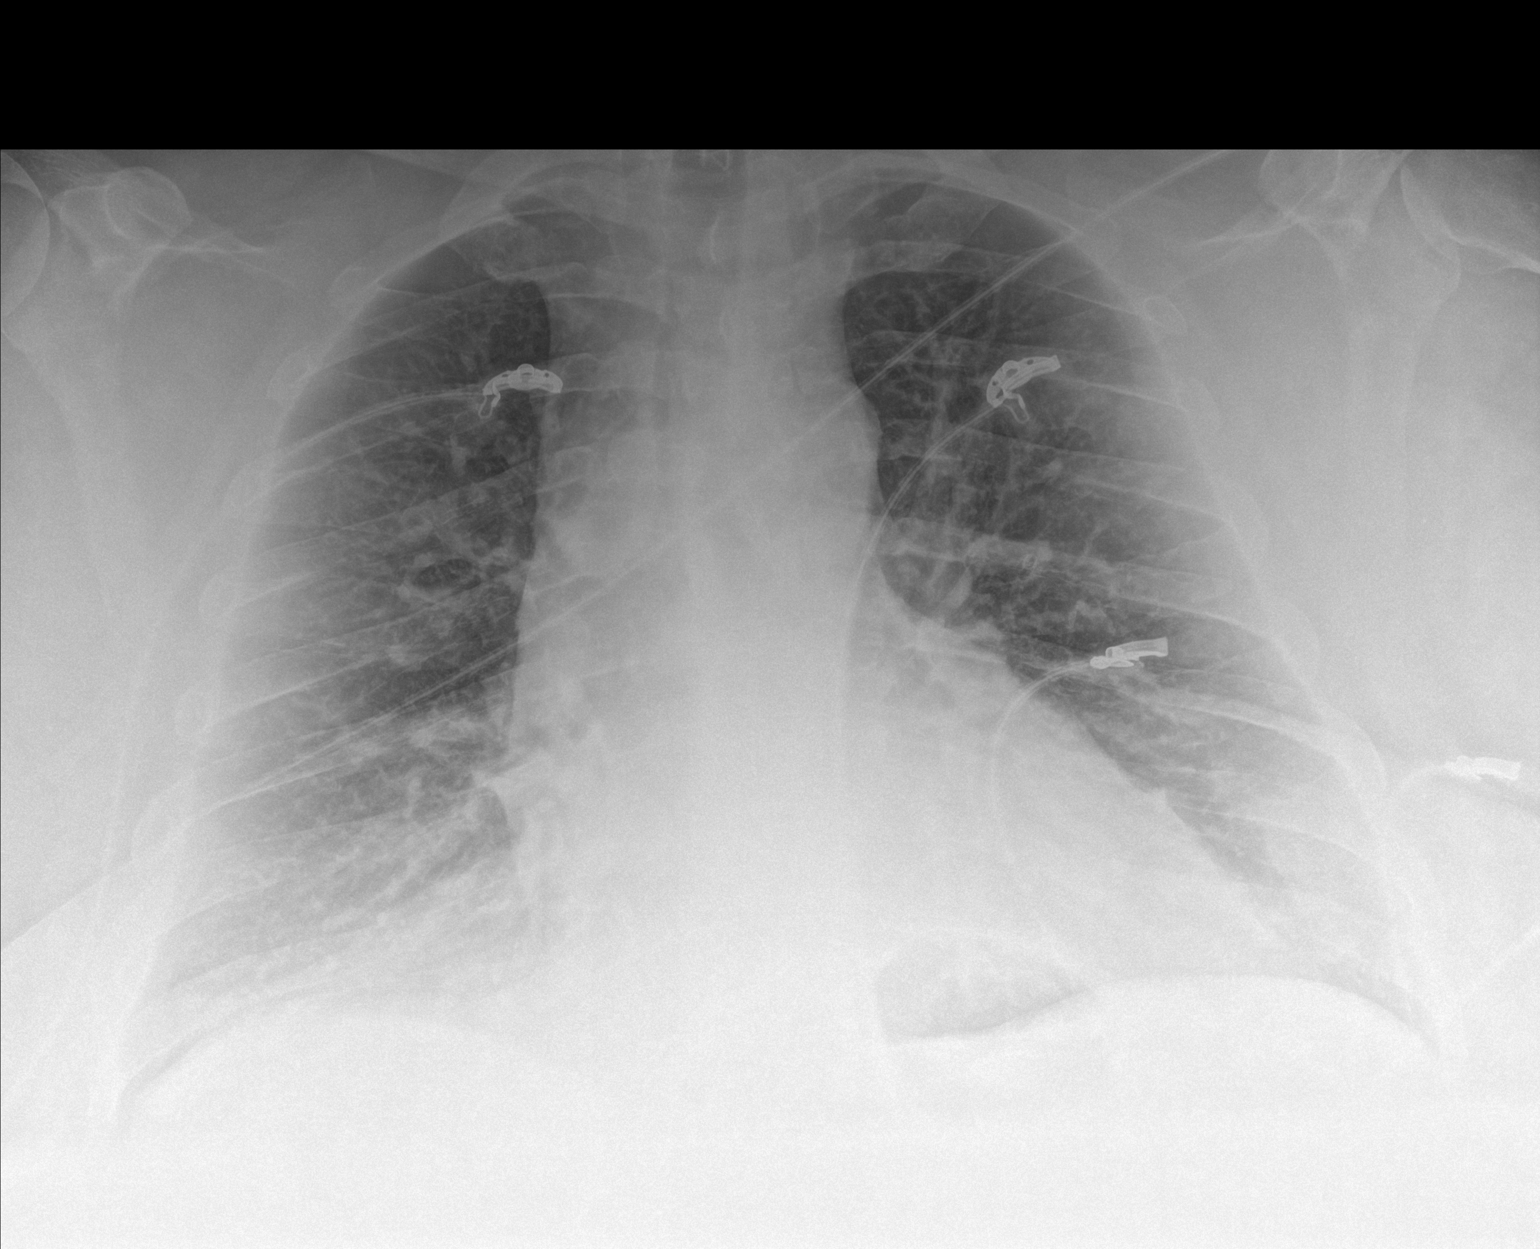

[1 of 1 positions shown; findings below may reference images not displayed]

FINDINGS: Normal heart size and mediastinal contours for technique. Chronic
interstitial coarsening and streaky density. Generous lung volumes.
There is no edema, consolidation, effusion, or pneumothorax.
IMPRESSION: Chronic bronchitic markings.  No acute finding.

## 2020-07-08 ENCOUNTER — Other Ambulatory Visit: Payer: Self-pay

## 2020-07-08 ENCOUNTER — Other Ambulatory Visit: Payer: Medicaid Other

## 2020-07-08 DIAGNOSIS — E1169 Type 2 diabetes mellitus with other specified complication: Secondary | ICD-10-CM

## 2020-07-08 NOTE — Progress Notes (Unsigned)
a1c

## 2020-07-09 ENCOUNTER — Ambulatory Visit: Payer: Medicaid Other

## 2020-07-09 LAB — LIPID PANEL
Chol/HDL Ratio: 6 ratio — ABNORMAL HIGH (ref 0.0–5.0)
Cholesterol, Total: 169 mg/dL (ref 100–199)
HDL: 28 mg/dL — ABNORMAL LOW (ref >39)
LDL Chol Calc (NIH): 105 mg/dL — ABNORMAL HIGH (ref 0–99)
Triglycerides: 207 mg/dL — ABNORMAL HIGH (ref 0–149)
VLDL Cholesterol Cal: 36 mg/dL (ref 5–40)

## 2020-07-09 LAB — BASIC METABOLIC PANEL
BUN/Creatinine Ratio: 20 (ref 9–20)
BUN: 13 mg/dL (ref 6–24)
CO2: 25 mmol/L (ref 20–29)
Calcium: 9.2 mg/dL (ref 8.7–10.2)
Chloride: 95 mmol/L — ABNORMAL LOW (ref 96–106)
Creatinine, Ser: 0.66 mg/dL — ABNORMAL LOW (ref 0.76–1.27)
GFR calc Af Amer: 132 mL/min/{1.73_m2} (ref >59)
GFR calc non Af Amer: 114 mL/min/{1.73_m2} (ref >59)
Glucose: 231 mg/dL — ABNORMAL HIGH (ref 65–99)
Potassium: 4.9 mmol/L (ref 3.5–5.2)
Sodium: 135 mmol/L (ref 134–144)

## 2020-07-09 LAB — HEMOGLOBIN A1C
Est. average glucose Bld gHb Est-mCnc: 235 mg/dL
Hgb A1c MFr Bld: 9.8 % — ABNORMAL HIGH (ref 4.8–5.6)

## 2020-07-27 ENCOUNTER — Other Ambulatory Visit: Payer: Self-pay | Admitting: Gerontology

## 2020-07-27 DIAGNOSIS — E1142 Type 2 diabetes mellitus with diabetic polyneuropathy: Secondary | ICD-10-CM

## 2020-07-28 ENCOUNTER — Other Ambulatory Visit: Payer: Self-pay

## 2020-07-28 ENCOUNTER — Ambulatory Visit: Payer: Medicaid Other | Admitting: Physician Assistant

## 2020-07-28 ENCOUNTER — Other Ambulatory Visit: Payer: Self-pay | Admitting: Gerontology

## 2020-07-28 DIAGNOSIS — E1165 Type 2 diabetes mellitus with hyperglycemia: Secondary | ICD-10-CM

## 2020-07-28 DIAGNOSIS — I252 Old myocardial infarction: Secondary | ICD-10-CM

## 2020-07-28 DIAGNOSIS — I10 Essential (primary) hypertension: Secondary | ICD-10-CM

## 2020-07-28 DIAGNOSIS — IMO0002 Reserved for concepts with insufficient information to code with codable children: Secondary | ICD-10-CM

## 2020-07-28 DIAGNOSIS — E1142 Type 2 diabetes mellitus with diabetic polyneuropathy: Secondary | ICD-10-CM

## 2020-07-28 MED ORDER — GABAPENTIN 300 MG PO CAPS: ORAL_CAPSULE | ORAL | 1 refills | Status: DC

## 2020-07-28 MED ORDER — BASAGLAR KWIKPEN 100 UNIT/ML ~~LOC~~ SOPN: 25.0000 [IU] | PEN_INJECTOR | Freq: Two times a day (BID) | SUBCUTANEOUS | 1 refills | Status: DC

## 2020-07-28 MED ORDER — HYDROCHLOROTHIAZIDE 12.5 MG PO CAPS: 25.0000 mg | ORAL_CAPSULE | Freq: Every day | ORAL | 1 refills | Status: DC

## 2020-07-28 NOTE — Progress Notes (Incomplete)
  Patient: Luis [name] Male    DOB: [birth year]   49 y.o.   MRN: [id number] Visit Date: 07/28/2020  Today's Provider: Mariane Duval, PA-C   Chief Complaint  Patient presents with  . Follow-up    labs  . Medication Refill    Gabapentin and HCTZ   Subjective:    Pt presents for 1 month f/u. His neuropathy continues to bother him considerably, but it is not getting worse, and the gabapentin helps (and he needs a refill). Otherwise, he reports feeling and doing well all things considered. He has lost ~10 lbs since his last visit, and he does report trying to make some changes to his diet (e.g., not drinking full-sugar sodas, instead opting for artificially-sweetened sodas). His     No Known Allergies Previous Medications   ASPIRIN EC 81 MG EC TABLET    Take 1 tablet (81 mg total) by mouth daily.   ATORVASTATIN (LIPITOR) 80 MG TABLET    Take 80 mg by mouth daily.   CLOPIDOGREL (PLAVIX) 75 MG TABLET    Take 1 tablet by mouth daily.   EZETIMIBE (ZETIA) 10 MG TABLET    Take 1 tablet (10 mg total) by mouth daily.   GLIMEPIRIDE (AMARYL) 4 MG TABLET    TAKE ONE TABLET BY MOUTH EVERY DAY WITH BREAKFAST   GLUCOSE BLOOD (CVS GLUCOSE METER TEST STRIPS) TEST STRIP    Use as instructed   IBUPROFEN (ADVIL,MOTRIN) 200 MG TABLET    Take 200-400 mg by mouth every 6 (six) hours as needed for fever or mild pain.    LANCETS (FREESTYLE) LANCETS    Use as instructed   LISINOPRIL (ZESTRIL) 20 MG TABLET    Take 1 tablet (20 mg total) by mouth daily.   METFORMIN (GLUCOPHAGE) 1000 MG TABLET    Take 1 tablet (1,000 mg total) by mouth 2 (two) times daily.   METHADONE (DOLOPHINE) 10 MG/5ML SOLUTION    Take 140 mg by mouth daily.    METOPROLOL TARTRATE (LOPRESSOR) 25 MG TABLET    Take 25 mg by mouth 2 (two) times daily.    Review of Systems  Social History   Tobacco Use  . Smoking status: Current Every Day Smoker    Packs/day: 1.00    Years: 35.00    Pack years: 35.00    Types: Cigarettes  . Smokeless  tobacco: Never Used  Substance Use Topics  . Alcohol use: Not Currently    Alcohol/week: 1.0 - 2.0 standard drink    Types: 1 - 2 Cans of beer per week   Objective:   BP 134/82 (BP Location: Left Arm, Patient Position: Sitting)   Pulse 85   Temp 98.6 F (37 C)   Resp 18   Ht 5\' 10"  (1.778 m)   Wt (!) 355 lb 4.8 oz (161.2 kg)   SpO2 93%   BMI 50.98 kg/m   Physical Exam      Assessment & Plan:           [facility]   [facility] [address]

## 2020-07-28 NOTE — Progress Notes (Addendum)
Patient: Luis [name] Male    DOB: 09/10/1970   49 y.o.   MRN: [id number] Visit Date: 07/28/2020  Today's Provider: Mariane Duval, PA-C   Chief Complaint  Patient presents with  . Follow-up    labs  . Medication Refill    Gabapentin and HCTZ   Subjective:    Pt presents for 1 month f/u. His neuropathy continues to bother him considerably, but it is not getting worse, and the gabapentin helps. Otherwise, he reports feeling and doing well all things considered.   He has lost ~10 lbs since his last visit, and he does report trying to make some changes to his diet (e.g., not drinking full-sugar sodas, instead opting for artificially-sweetened sodas). His cholesterol is suboptimally controlled given his history of NSTEMI in Sep 2020, and he's taking atorvastatin 80 mg and ezetimibe 10 mg daily. His A1c has improved somewhat from the last read (9.8 down from 10.2), but it is still fairly far from goal. He reports consistently measuring fasting AM glucose and it being around 130. He is still smoking and is still not ready to quit (see my note from last month). He reports full adherence with his medication. Needs a refill on gabapentin and HCTZ.    No Known Allergies Previous Medications   ASPIRIN EC 81 MG EC TABLET    Take 1 tablet (81 mg total) by mouth daily.   ATORVASTATIN (LIPITOR) 80 MG TABLET    Take 80 mg by mouth daily.   CLOPIDOGREL (PLAVIX) 75 MG TABLET    Take 1 tablet by mouth daily.   EZETIMIBE (ZETIA) 10 MG TABLET    Take 1 tablet (10 mg total) by mouth daily.   GLIMEPIRIDE (AMARYL) 4 MG TABLET    TAKE ONE TABLET BY MOUTH EVERY DAY WITH BREAKFAST   GLUCOSE BLOOD ([employer] GLUCOSE METER TEST STRIPS) TEST STRIP    Use as instructed   IBUPROFEN (ADVIL,MOTRIN) 200 MG TABLET    Take 200-400 mg by mouth every 6 (six) hours as needed for fever or mild pain.    LANCETS (FREESTYLE) LANCETS    Use as instructed   LISINOPRIL (ZESTRIL) 20 MG TABLET    Take 1 tablet (20 mg total) by mouth  daily.   METFORMIN (GLUCOPHAGE) 1000 MG TABLET    Take 1 tablet (1,000 mg total) by mouth 2 (two) times daily.   METHADONE (DOLOPHINE) 10 MG/5ML SOLUTION    Take 140 mg by mouth daily.    METOPROLOL TARTRATE (LOPRESSOR) 25 MG TABLET    Take 25 mg by mouth 2 (two) times daily.   Review of systems (may be based on changes from baseline unless otherwise stated) See HPI as well General: no malaise, fatigue Eyes: no visual blurring, double vision, or other ocular symptoms (latter asked generically) Respiratory: no SOB, DOE, cough, wheezing, orthopnea, or PND Cardiovascular: no chest pain; chest pressure; pain or pressure in neck, arm, jaw, or epigastric area; palpitations; or edema Gastrointestinal: no nausea, vomiting, abdominal pain, diarrhea, indigestion Genitourinary: no dysuria, increased frequency, or other GU symptoms (latter asked generically) Neurologic: no headaches, syncope, worsening or new neuropathy symtpoms, "dizziness or lightheadedness"  Social History   Tobacco Use  . Smoking status: Current Every Day Smoker    Packs/day: 1.00    Years: 35.00    Pack years: 35.00    Types: Cigarettes  . Smokeless tobacco: Never Used  Substance Use Topics  . Alcohol use: Not Currently    Alcohol/week: 1.0 - 2.0  standard drink    Types: 1 - 2 Cans of beer per week   Objective:   BP 134/82 (BP Location: Left Arm, Patient Position: Sitting)   Pulse 85   Temp 98.6 F (37 C)   Resp 18   Ht 5\' 10"  (1.778 m)   Wt (!) 355 lb 4.8 oz (161.2 kg)   SpO2 93%   BMI 50.98 kg/m   Physical exam Constitutional: A&Ox3, NAD, pleasant, cooperative HEENT: [place]/AT Respiratory: breathing unlabored, good and symmetric chest expansion; lung sounds distant and auscultation reliability reduced d/t habitus, but no concerns about air entry, and lungs seem clear to auscultation bilaterally Cardiovascular: auscultation reliability reduced d/t habitus, but RRR, S1, S2; no S3 or S4 appreciated; no murmurs  (despite mild MR noted on 2020 echo), clicks, rubs heard    Assessment & Plan:     HTN -Cont current regimen -Refilled HCTZ  DM -Disc R/B/A of increasing regimen to get closer to goal -He previously was a little concerned about having a low blood sugar with the last insulin increase, though a low blood sugar never occurred -After fairly lengthy discussion and considering how titration of insulin has affected his A1c thus far and his values and preferences, we agreed on giving insulin glargine twice a day at 25 units in the AM and 25 units in the PM (changing from previous regimen of taking 40 units every evening); he understands this may not be the final dose/regimen, but we'll continue to work toward goal -Disc s/s of hypoglycemia, what to do to counter it, and the need to call to inform 2021 if this occurs -Offered and encouraged at least one visit with endo in case they can offer additional insights, encouragement, or reassurances; he will see them in Jan of 2022 (appreciate the expertise and time) -See "Lifestyle" below  HLD -Suboptimally controlled given history of NSTEMI -Already taking atorva 80 mg and ezetimibe 10 mg -Will increase atorva 80 mg to rosuva 40 mg -See "Lifestyle" below  Lifestyle -Encouraged on weight loss and lifestyle changes; encouraged to continue and discussed what lifestyle changes could help achieve in the long run  Tobacco abuse -Appreciate pt's continued honesty about not being ready to quit -Still discussed need to quit, and he expresses understanding; gave brief counseling (as with last visit) and advised we're here to help however we can whenever he's ready     Follow-up -1 month with endo -3 months with [facility] general (or sooner if needed), with labs before 75-month f/u visit (orders placed)  [facility]   [facility]

## 2020-07-29 ENCOUNTER — Encounter: Payer: Self-pay | Admitting: Physician Assistant

## 2020-07-29 MED ORDER — ROSUVASTATIN CALCIUM 20 MG PO TABS: 40.0000 mg | ORAL_TABLET | Freq: Every day | ORAL | 0 refills | Status: DC

## 2020-09-08 ENCOUNTER — Ambulatory Visit: Payer: Medicaid Other

## 2020-09-21 ENCOUNTER — Other Ambulatory Visit: Payer: Self-pay | Admitting: Gerontology

## 2020-09-21 DIAGNOSIS — IMO0002 Reserved for concepts with insufficient information to code with codable children: Secondary | ICD-10-CM

## 2020-09-21 DIAGNOSIS — E1165 Type 2 diabetes mellitus with hyperglycemia: Secondary | ICD-10-CM

## 2020-09-25 ENCOUNTER — Other Ambulatory Visit: Payer: Self-pay | Admitting: Gerontology

## 2020-09-30 ENCOUNTER — Encounter: Payer: Self-pay | Admitting: Physician Assistant

## 2020-10-06 ENCOUNTER — Other Ambulatory Visit: Payer: Self-pay

## 2020-10-06 ENCOUNTER — Ambulatory Visit: Payer: Medicaid Other | Admitting: "Endocrinology

## 2020-10-06 DIAGNOSIS — E1165 Type 2 diabetes mellitus with hyperglycemia: Secondary | ICD-10-CM

## 2020-10-06 DIAGNOSIS — IMO0002 Reserved for concepts with insufficient information to code with codable children: Secondary | ICD-10-CM

## 2020-10-06 MED ORDER — OZEMPIC (0.25 OR 0.5 MG/DOSE) 2 MG/1.5ML ~~LOC~~ SOPN: PEN_INJECTOR | SUBCUTANEOUS | 1 refills | Status: AC

## 2020-10-06 NOTE — Progress Notes (Signed)
Follow up Diabetes/ Endocrine [facility]     Patient ID: Luis [name], male   DOB: July 26, 1971, 50 y.o.   MRN: [id number] Assessment:  Luis [name] is a 50 y.o. male who is seen in follow up for T2DM at the request of Odem, Deeann Cree, FNP.  Encounter Diagnoses No diagnosis found.  Assessment  Patient is a 50 year old male with T2DM with peripheral neuropathy who is currently not at goal with treatment.   Plan:     - Start Ozempic 0.25 mg for 1 month.  - Follow up with Ophthalmology for annual diabetic retinopathy screening. - Schedule routine diabetes visit in 3 months.     No orders of the defined types were placed in this encounter.    Subjective:  Patient is a 50 year old male with a 5-6-year history of T2DM with peripheral neuropathy. He states that he measures his blood glucose 2x/day (AM and PM). His fasting glucose are around 110-115 (sometimes 150); glucose levels in the evening are around 130-135. He currently manages hi T2DM with metformin, insulin glargine, and glimepiride. He reports a change in his insulin dose from 40 units in the morning to 25/25 for the day; the change was made 3 months ago. He denies any change in in fasting glucose readings with that change. He states that Trulicity was part of his regimen before the addition of insulin. He reports GI symptoms that would get better as he takes Trulicity but would resume each time he restarted Trulicity after a period of gap in coverage. He also reports that he has not taken metformin for the past 3 days.  He denies symptoms of hyperglycemia, hypoglycemia. He reports tingling sensation in his hands, lower legs, and feet. He states that gabapentin provides relief.   He reports some weight loss because of efforts to reduce meal portions. He describes his diet as consisting of grilled/baked chicken, steak, and plenty of vegetables, and occasional zero-sugar soda. He reports walking occasionally, but is limited by  pain in his knees and back.     Review of Systems  Luis [name]  has a past medical history of ACS (acute coronary syndrome) (HCC) (05/20/2019), Back pain, Diabetes mellitus without complication (HCC), Drug addiction in remission (HCC), High cholesterol, Hypertension, Knee pain, Myocardial infarction (HCC) (05/2019), and Neuropathy.  Family History, Social History, current Medications and allergies reviewed and updated in Epic.  Objective:    There were no vitals taken for this visit. Physical Exam      Data : I have personally reviewed pertinent labs and imaging studies, if indicated,  with the patient in clinic today.   Lab Orders  No laboratory test(s) ordered today    HC Readings from Last 3 Encounters:  No data found for [facility]    Wt Readings from Last 3 Encounters:  07/28/20 (!) 355 lb 4.8 oz (161.2 kg)  06/23/20 (!) 365 lb 3.2 oz (165.7 kg)  05/08/20 (!) 360 lb (163.3 kg)

## 2020-10-07 ENCOUNTER — Other Ambulatory Visit: Payer: Self-pay | Admitting: "Endocrinology

## 2020-10-07 ENCOUNTER — Other Ambulatory Visit: Payer: Self-pay | Admitting: Gerontology

## 2020-10-07 DIAGNOSIS — E1165 Type 2 diabetes mellitus with hyperglycemia: Secondary | ICD-10-CM

## 2020-10-07 DIAGNOSIS — IMO0002 Reserved for concepts with insufficient information to code with codable children: Secondary | ICD-10-CM

## 2020-10-20 ENCOUNTER — Telehealth: Payer: Self-pay | Admitting: Pharmacist

## 2020-10-20 NOTE — Telephone Encounter (Signed)
10/20/2020 8:26:20 AM - Ozempic pending  -- Rhetta Mura - Tuesday, October 20, 2020 8:25 AM --I have received the patient's signed portion of Novo Nordisk application for Ozempic-holding for patient to return financials, and provider portion to be returned.

## 2020-10-22 ENCOUNTER — Other Ambulatory Visit: Payer: Self-pay | Admitting: Gerontology

## 2020-10-22 ENCOUNTER — Other Ambulatory Visit: Payer: Medicaid Other

## 2020-10-22 ENCOUNTER — Other Ambulatory Visit: Payer: Self-pay

## 2020-10-22 ENCOUNTER — Telehealth: Payer: Self-pay | Admitting: Pharmacist

## 2020-10-22 DIAGNOSIS — E1165 Type 2 diabetes mellitus with hyperglycemia: Secondary | ICD-10-CM

## 2020-10-22 DIAGNOSIS — E1142 Type 2 diabetes mellitus with diabetic polyneuropathy: Secondary | ICD-10-CM

## 2020-10-22 DIAGNOSIS — IMO0002 Reserved for concepts with insufficient information to code with codable children: Secondary | ICD-10-CM

## 2020-10-22 MED ORDER — GABAPENTIN 300 MG PO CAPS: ORAL_CAPSULE | ORAL | 1 refills | Status: DC

## 2020-10-22 NOTE — Telephone Encounter (Signed)
10/22/2020 10:53:27 AM - Ozempic pending  -- Rhetta Mura - Thursday, October 22, 2020 10:51 AM -- I have now received the signed provider portion of Novo Nordisk for Ozempic-Holding for patient to return financials. Letter requesting this was given to patient 10/12/2020.

## 2020-10-23 LAB — SPECIMEN STATUS REPORT

## 2020-10-23 LAB — LIPID PANEL
Chol/HDL Ratio: 6.5 ratio — ABNORMAL HIGH (ref 0.0–5.0)
Cholesterol, Total: 181 mg/dL (ref 100–199)
HDL: 28 mg/dL — ABNORMAL LOW (ref >39)
LDL Chol Calc (NIH): 120 mg/dL — ABNORMAL HIGH (ref 0–99)
Triglycerides: 184 mg/dL — ABNORMAL HIGH (ref 0–149)
VLDL Cholesterol Cal: 33 mg/dL (ref 5–40)

## 2020-10-23 LAB — HEMOGLOBIN A1C
Est. average glucose Bld gHb Est-mCnc: 237 mg/dL
Hgb A1c MFr Bld: 9.9 % — ABNORMAL HIGH (ref 4.8–5.6)

## 2020-10-29 ENCOUNTER — Ambulatory Visit: Payer: Medicaid Other

## 2020-11-05 ENCOUNTER — Ambulatory Visit: Payer: Medicaid Other | Admitting: Gerontology

## 2020-11-05 ENCOUNTER — Other Ambulatory Visit: Payer: Self-pay | Admitting: Gerontology

## 2020-11-05 ENCOUNTER — Other Ambulatory Visit: Payer: Self-pay

## 2020-11-05 VITALS — BP 122/72 | HR 87 | Temp 98.5°F | Ht 68.0 in | Wt 358.3 lb

## 2020-11-05 DIAGNOSIS — I10 Essential (primary) hypertension: Secondary | ICD-10-CM

## 2020-11-05 DIAGNOSIS — E1142 Type 2 diabetes mellitus with diabetic polyneuropathy: Secondary | ICD-10-CM

## 2020-11-05 DIAGNOSIS — IMO0002 Reserved for concepts with insufficient information to code with codable children: Secondary | ICD-10-CM

## 2020-11-05 MED ORDER — GABAPENTIN 400 MG PO CAPS: 400.0000 mg | ORAL_CAPSULE | Freq: Three times a day (TID) | ORAL | 2 refills | Status: DC

## 2020-11-05 MED ORDER — ROSUVASTATIN CALCIUM 40 MG PO TABS: 40.0000 mg | ORAL_TABLET | Freq: Every day | ORAL | 2 refills | Status: DC

## 2020-11-05 MED ORDER — HYDROCHLOROTHIAZIDE 12.5 MG PO CAPS: 25.0000 mg | ORAL_CAPSULE | Freq: Every day | ORAL | 1 refills | Status: DC

## 2020-11-05 MED ORDER — GLIMEPIRIDE 4 MG PO TABS: ORAL_TABLET | ORAL | 2 refills | Status: DC

## 2020-11-05 MED ORDER — BASAGLAR KWIKPEN 100 UNIT/ML ~~LOC~~ SOPN: 25.0000 [IU] | PEN_INJECTOR | Freq: Two times a day (BID) | SUBCUTANEOUS | 5 refills | Status: DC

## 2020-11-05 NOTE — Progress Notes (Signed)
Established Patient Office Visit  Subjective:  Patient ID: Luis [name], male    DOB: 03-22-1971  Age: 50 y.o. MRN: [id number]  CC: No chief complaint on file.   HPI Luis [name] 50 y/o male who has history of Type 2 DM, ACS, MI, HTN , Hyperlipidemia presents for follow up of type 2 diabetes , lab review and medication refill. His HgbA1c done on 10/22/20 increased from 9.8% to 9.9%. He states that he's compliant with his medication and adheres to ADA diet .He checks his blood glucose bid, his fasting glucose was 115 mg/dl, He denies hypo/hyperglycemic symptoms, and states that his peripheral neuropathy is not controlled with taking 300 mg gabapentin tid. He also performs daily foot check. He states that he has not picked up Ozempic from Pharmacy, but has completed the financial application. His LDL increased from 105 mg/dl to 389 mg/dl. Overall, he states that he's doing well and offers no further complaint.    Past Medical History:  Diagnosis Date  . ACS (acute coronary syndrome) (HCC) 05/20/2019  . Back pain   . Diabetes mellitus without complication (HCC)   . Drug addiction in remission (HCC)   . High cholesterol   . Hypertension   . Knee pain   . Myocardial infarction (HCC) 05/2019  . Neuropathy     Past Surgical History:  Procedure Laterality Date  . KNEE SURGERY Right   . LEFT HEART CATH AND CORONARY ANGIOGRAPHY N/A 05/21/2019   Procedure: LEFT HEART CATH AND CORONARY ANGIOGRAPHY;  Surgeon: Marcina Millard, MD;  Location: [facility] INVASIVE CV LAB;  Service: Cardiovascular;  Laterality: N/A;    Family History  Problem Relation Age of Onset  . Cancer Mother   . Heart disease Mother   . Heart disease Father   . Diabetes Paternal Grandfather   . Heart disease Brother     Social History   Socioeconomic History  . Marital status: Divorced    Spouse name: Not on file  . Number of children: 2  . Years of education: Not on file  . Highest education level: High  school graduate  Occupational History  . Occupation: unemployed  Tobacco Use  . Smoking status: Current Every Day Smoker    Packs/day: 1.00    Years: 35.00    Pack years: 35.00    Types: Cigarettes  . Smokeless tobacco: Never Used  Vaping Use  . Vaping Use: Never used  Substance and Sexual Activity  . Alcohol use: Not Currently    Alcohol/week: 1.0 - 2.0 standard drink    Types: 1 - 2 Cans of beer per week  . Drug use: Not Currently    Types: Marijuana, Methamphetamines    Comment: quit 2000  . Sexual activity: Not Currently    Birth control/protection: Condom  Other Topics Concern  . Not on file  Social History Narrative   On food stamps, which helps with food. Living with mom as has been unemployed due to nueropathy.   Social Determinants of Health   Financial Resource Strain: Not on file  Food Insecurity: Not on file  Transportation Needs: Not on file  Physical Activity: Not on file  Stress: Not on file  Social Connections: Not on file  Intimate Partner Violence: Not on file    Outpatient Medications Prior to Visit  Medication Sig Dispense Refill  . aspirin EC 81 MG EC tablet Take 1 tablet (81 mg total) by mouth daily. 30 tablet 0  . ezetimibe (ZETIA) 10  MG tablet Take 1 tablet (10 mg total) by mouth daily. 30 tablet 2  . glucose blood ([employer]S GLUCOSE METER TEST STRIPS) test strip Use as instructed 100 each 12  . ibuprofen (ADVIL,MOTRIN) 200 MG tablet Take 200-400 mg by mouth every 6 (six) hours as needed for fever or mild pain.     . Lancets (FREESTYLE) lancets Use as instructed 100 each 12  . lisinopril (ZESTRIL) 20 MG tablet Take 1 tablet (20 mg total) by mouth daily. 90 tablet 1  . metFORMIN (GLUCOPHAGE) 1000 MG tablet TAKE ONE TABLET BY MOUTH 2 TIMES A DAY 180 tablet 0  . methadone (DOLOPHINE) 10 MG/5ML solution Take 140 mg by mouth daily.     . metoprolol tartrate (LOPRESSOR) 25 MG tablet Take 25 mg by mouth 2 (two) times daily.    Marland Kitchen gabapentin (NEURONTIN) 300 MG  capsule TAKE 3 CAPSULES BY MOUTH EVERY MORNING, 3 CAPSULES MID DAY AND 3 CAPSULES EVERY EVENING 270 capsule 1  . glimepiride (AMARYL) 4 MG tablet TAKE ONE TABLET BY MOUTH EVERY DAY WITH BREAKFAST 60 tablet 0  . Insulin Glargine (BASAGLAR KWIKPEN) 100 UNIT/ML INJECT 35 UNITS UNDER THE SKIN ATBEDTIME 3 mL 5  . rosuvastatin (CRESTOR) 20 MG tablet TAKE TWO TABLETS BY MOUTH EVERY DAY. DISCONTINUE ATORVASTTIN 80. 60 tablet 0  . clopidogrel (PLAVIX) 75 MG tablet Take 1 tablet by mouth daily. (Patient not taking: No sig reported)    . Semaglutide,0.25 or 0.5MG /DOS, (OZEMPIC, 0.25 OR 0.5 MG/DOSE,) 2 MG/1.5ML SOPN Inject 0.25 mg into the skin once a week for 28 days, THEN 0.5 mg once a week. (Patient not taking: Reported on 11/05/2020) 4.5 mL 1  . hydrochlorothiazide (MICROZIDE) 12.5 MG capsule Take 2 capsules (25 mg total) by mouth daily. 60 capsule 1   No facility-administered medications prior to visit.    No Known Allergies  ROS Review of Systems  Constitutional: Negative.   Eyes: Negative.   Respiratory: Negative.   Cardiovascular: Negative.   Endocrine: Negative.   Skin: Negative.   Neurological: Positive for numbness (peripheral neuropathy).  Psychiatric/Behavioral: Negative.       Objective:    Physical Exam HENT:     Head: Normocephalic and atraumatic.  Eyes:     Extraocular Movements: Extraocular movements intact.     Conjunctiva/sclera: Conjunctivae normal.     Pupils: Pupils are equal, round, and reactive to light.  Cardiovascular:     Rate and Rhythm: Normal rate and regular rhythm.     Pulses: Normal pulses.     Heart sounds: Normal heart sounds.  Pulmonary:     Effort: Pulmonary effort is normal.     Breath sounds: Normal breath sounds.  Skin:    General: Skin is warm.  Neurological:     General: No focal deficit present.     Mental Status: He is alert and oriented to person, place, and time. Mental status is at baseline.  Psychiatric:        Mood and Affect: Mood  normal.        Behavior: Behavior normal.        Thought Content: Thought content normal.        Judgment: Judgment normal.     BP 122/72 (BP Location: Left Arm, Patient Position: Sitting, Cuff Size: Large)   Pulse 87   Temp 98.5 F (36.9 C) (Oral)   Ht 5\' 8"  (1.727 m)   Wt (!) 358 lb 4.8 oz (162.5 kg)   SpO2 92%   BMI  54.48 kg/m  Wt Readings from Last 3 Encounters:  11/05/20 (!) 358 lb 4.8 oz (162.5 kg)  07/28/20 (!) 355 lb 4.8 oz (161.2 kg)  06/23/20 (!) 365 lb 3.2 oz (165.7 kg)   Encouraged weight loss  Health Maintenance Due  Topic Date Due  . Hepatitis C Screening  Never done  . COVID-19 Vaccine (1) Never done  . FOOT EXAM  Never done  . OPHTHALMOLOGY EXAM  Never done  . HIV Screening  Never done  . TETANUS/TDAP  Never done  . COLONOSCOPY (Pts 45-80yrs Insurance coverage will need to be confirmed)  Never done  . INFLUENZA VACCINE  03/22/2020    There are no preventive care reminders to display for this patient.  Lab Results  Component Value Date   TSH 2.920 03/26/2020   Lab Results  Component Value Date   WBC 12.5 (H) 10/23/2019   HGB 15.2 10/23/2019   HCT 45.4 10/23/2019   MCV 84 10/23/2019   PLT 311 08/01/2019   Lab Results  Component Value Date   NA 135 07/08/2020   K 4.9 07/08/2020   CO2 25 07/08/2020   GLUCOSE 231 (H) 07/08/2020   BUN 13 07/08/2020   CREATININE 0.66 (L) 07/08/2020   BILITOT 0.3 03/18/2020   ALKPHOS 94 03/18/2020   AST 11 03/18/2020   ALT 18 03/18/2020   PROT 6.9 03/18/2020   ALBUMIN 4.0 03/18/2020   CALCIUM 9.2 07/08/2020   ANIONGAP 12 05/22/2019   Lab Results  Component Value Date   CHOL 181 10/22/2020   Lab Results  Component Value Date   HDL 28 (L) 10/22/2020   Lab Results  Component Value Date   LDLCALC 120 (H) 10/22/2020   Lab Results  Component Value Date   TRIG 184 (H) 10/22/2020   Lab Results  Component Value Date   CHOLHDL 6.5 (H) 10/22/2020   Lab Results  Component Value Date   HGBA1C 9.9 (H)  10/22/2020      Assessment & Plan:     1. Uncontrolled type 2 diabetes mellitus with diabetic polyneuropathy, with long-term current use of insulin (HCC) -He will continue on current treatment regimen, low car/ non concentrated sweet diet and exercise as tolerated. He was advised to notify clinic immediately he starts weekly Ozempic injection. - glimepiride (AMARYL) 4 MG tablet; Take 1 tablet by mouth daily  Dispense: 30 tablet; Refill: 2 - Insulin Glargine (BASAGLAR KWIKPEN) 100 UNIT/ML; Inject 25 Units into the skin 2 (two) times daily.  Dispense: 3 mL; Refill: 5 - rosuvastatin (CRESTOR) 40 MG tablet; Take 1 tablet (40 mg total) by mouth daily.  Dispense: 30 tablet; Refill: 2 - Ambulatory referral to Ophthalmology - gabapentin (NEURONTIN) 400 MG capsule; Take 1 capsule (400 mg total) by mouth 3 (three) times daily.  Dispense: 90 capsule; Refill: 2 - HgB A1c; Future  2. Essential hypertension -His blood pressure is under control, he will continue on current medication, DASH diet and exercise as tolerated. - hydrochlorothiazide (MICROZIDE) 12.5 MG capsule; Take 2 capsules (25 mg total) by mouth daily.  Dispense: 60 capsule; Refill: 1  3. Diabetic peripheral neuropathy (HCC) - His neuropathy is not controlled, his gabapentin was increase to 400 mg tid.  gabapentin (NEURONTIN) 400 MG capsule; Take 1 capsule (400 mg total) by mouth 3 (three) times daily.  Dispense: 90 capsule; Refill: 2    Follow-up: Return in about 12 weeks (around 01/28/2021).    Adams Hinch Trellis Paganini, NP

## 2020-11-05 NOTE — Patient Instructions (Signed)
Heart-Healthy Eating Plan Heart-healthy meal planning includes:  Eating less unhealthy fats.  Eating more healthy fats.  Making other changes in your diet. Talk with your doctor or a diet specialist (dietitian) to create an eating plan that is right for you. What is my plan? Your doctor may recommend an eating plan that includes:  Total fat: ______% or less of total calories a day.  Saturated fat: ______% or less of total calories a day.  Cholesterol: less than _________mg a day. What are tips for following this plan? Cooking Avoid frying your food. Try to bake, boil, grill, or broil it instead. You can also reduce fat by:  Removing the skin from poultry.  Removing all visible fats from meats.  Steaming vegetables in water or broth. Meal planning  At meals, divide your plate into four equal parts: ? Fill one-half of your plate with vegetables and green salads. ? Fill one-fourth of your plate with whole grains. ? Fill one-fourth of your plate with lean protein foods.  Eat 4-5 servings of vegetables per day. A serving of vegetables is: ? 1 cup of raw or cooked vegetables. ? 2 cups of raw leafy greens.  Eat 4-5 servings of fruit per day. A serving of fruit is: ? 1 medium whole fruit. ?  cup of dried fruit. ?  cup of fresh, frozen, or canned fruit. ?  cup of 100% fruit juice.  Eat more foods that have soluble fiber. These are apples, broccoli, carrots, beans, peas, and barley. Try to get 20-30 g of fiber per day.  Eat 4-5 servings of nuts, legumes, and seeds per week: ? 1 serving of dried beans or legumes equals  cup after being cooked. ? 1 serving of nuts is  cup. ? 1 serving of seeds equals 1 tablespoon.   General information  Eat more home-cooked food. Eat less restaurant, buffet, and fast food.  Limit or avoid alcohol.  Limit foods that are high in starch and sugar.  Avoid fried foods.  Lose weight if you are overweight.  Keep track of how much salt  (sodium) you eat. This is important if you have high blood pressure. Ask your doctor to tell you more about this.  Try to add vegetarian meals each week. Fats  Choose healthy fats. These include olive oil and canola oil, flaxseeds, walnuts, almonds, and seeds.  Eat more omega-3 fats. These include salmon, mackerel, sardines, tuna, flaxseed oil, and ground flaxseeds. Try to eat fish at least 2 times each week.  Check food labels. Avoid foods with trans fats or high amounts of saturated fat.  Limit saturated fats. ? These are often found in animal products, such as meats, butter, and cream. ? These are also found in plant foods, such as palm oil, palm kernel oil, and coconut oil.  Avoid foods with partially hydrogenated oils in them. These have trans fats. Examples are stick margarine, some tub margarines, cookies, crackers, and other baked goods. What foods can I eat? Fruits All fresh, canned (in natural juice), or frozen fruits. Vegetables Fresh or frozen vegetables (raw, steamed, roasted, or grilled). Green salads. Grains Most grains. Choose whole wheat and whole grains most of the time. Rice and pasta, including brown rice and pastas made with whole wheat. Meats and other proteins Lean, well-trimmed beef, veal, pork, and lamb. Chicken and turkey without skin. All fish and shellfish. Wild duck, rabbit, pheasant, and venison. Egg whites or low-cholesterol egg substitutes. Dried beans, peas, lentils, and tofu. Seeds and   most nuts. Dairy Low-fat or nonfat cheeses, including ricotta and mozzarella. Skim or 1% milk that is liquid, powdered, or evaporated. Buttermilk that is made with low-fat milk. Nonfat or low-fat yogurt. Fats and oils Non-hydrogenated (trans-free) margarines. Vegetable oils, including soybean, sesame, sunflower, olive, peanut, safflower, corn, canola, and cottonseed. Salad dressings or mayonnaise made with a vegetable oil. Beverages Mineral water. Coffee and tea. Diet  carbonated beverages. Sweets and desserts Sherbet, gelatin, and fruit ice. Small amounts of dark chocolate. Limit all sweets and desserts. Seasonings and condiments All seasonings and condiments. The items listed above may not be a complete list of foods and drinks you can eat. Contact a dietitian for more options. What foods should I avoid? Fruits Canned fruit in heavy syrup. Fruit in cream or butter sauce. Fried fruit. Limit coconut. Vegetables Vegetables cooked in cheese, cream, or butter sauce. Fried vegetables. Grains Breads that are made with saturated or trans fats, oils, or whole milk. Croissants. Sweet rolls. Donuts. High-fat crackers, such as cheese crackers. Meats and other proteins Fatty meats, such as hot dogs, ribs, sausage, bacon, rib-eye roast or steak. High-fat deli meats, such as salami and bologna. Caviar. Domestic duck and goose. Organ meats, such as liver. Dairy Cream, sour cream, cream cheese, and creamed cottage cheese. Whole-milk cheeses. Whole or 2% milk that is liquid, evaporated, or condensed. Whole buttermilk. Cream sauce or high-fat cheese sauce. Yogurt that is made from whole milk. Fats and oils Meat fat, or shortening. Cocoa butter, hydrogenated oils, palm oil, coconut oil, palm kernel oil. Solid fats and shortenings, including bacon fat, salt pork, lard, and butter. Nondairy cream substitutes. Salad dressings with cheese or sour cream. Beverages Regular sodas and juice drinks with added sugar. Sweets and desserts Frosting. Pudding. Cookies. Cakes. Pies. Milk chocolate or white chocolate. Buttered syrups. Full-fat ice cream or ice cream drinks. The items listed above may not be a complete list of foods and drinks to avoid. Contact a dietitian for more information. Summary  Heart-healthy meal planning includes eating less unhealthy fats, eating more healthy fats, and making other changes in your diet.  Eat a balanced diet. This includes fruits and  vegetables, low-fat or nonfat dairy, lean protein, nuts and legumes, whole grains, and heart-healthy oils and fats. This information is not intended to replace advice given to you by your health care provider. Make sure you discuss any questions you have with your health care provider. Document Revised: 10/12/2017 Document Reviewed: 09/15/2017 Elsevier Patient Education  2021 [employer] https://www.diabeteseducator.org/docs/default-source/living-with-diabetes/conquering-the-grocery-store-v1.pdf?sfvrsn=4">  Carbohydrate Counting for Diabetes Mellitus, Adult Carbohydrate counting is a method of keeping track of how many carbohydrates you eat. Eating carbohydrates naturally increases the amount of sugar (glucose) in the blood. Counting how many carbohydrates you eat improves your blood glucose control, which helps you manage your diabetes. It is important to know how many carbohydrates you can safely have in each meal. This is different for every person. A dietitian can help you make a meal plan and calculate how many carbohydrates you should have at each meal and snack. What foods contain carbohydrates? Carbohydrates are found in the following foods:  Grains, such as breads and cereals.  Dried beans and soy products.  Starchy vegetables, such as potatoes, peas, and corn.  Fruit and fruit juices.  Milk and yogurt.  Sweets and snack foods, such as cake, cookies, candy, chips, and soft drinks.   How do I count carbohydrates in foods? There are two ways to count carbohydrates in food. You can read   food labels or learn standard serving sizes of foods. You can use either of the methods or a combination of both. Using the Nutrition Facts label The Nutrition Facts list is included on the labels of almost all packaged foods and beverages in the [place] It includes:  The serving size.  Information about nutrients in each serving, including the grams (g) of carbohydrate per serving. To use the  Nutrition Facts:  Decide how many servings you will have.  Multiply the number of servings by the number of carbohydrates per serving.  The resulting number is the total amount of carbohydrates that you will be having. Learning the standard serving sizes of foods When you eat carbohydrate foods that are not packaged or do not include Nutrition Facts on the label, you need to measure the servings in order to count the amount of carbohydrates.  Measure the foods that you will eat with a food scale or measuring cup, if needed.  Decide how many standard-size servings you will eat.  Multiply the number of servings by 15. For foods that contain carbohydrates, one serving equals 15 g of carbohydrates. ? For example, if you eat 2 cups or 10 oz (300 g) of strawberries, you will have eaten 2 servings and 30 g of carbohydrates (2 servings x 15 g = 30 g).  For foods that have more than one food mixed, such as soups and casseroles, you must count the carbohydrates in each food that is included. The following list contains standard serving sizes of common carbohydrate-rich foods. Each of these servings has about 15 g of carbohydrates:  1 slice of bread.  1 six-inch (15 cm) tortilla.  ? cup or 2 oz (53 g) cooked rice or pasta.   cup or 3 oz (85 g) cooked or canned, drained and rinsed beans or lentils.   cup or 3 oz (85 g) starchy vegetable, such as peas, corn, or squash.   cup or 4 oz (120 g) hot cereal.   cup or 3 oz (85 g) boiled or mashed potatoes, or  or 3 oz (85 g) of a large baked potato.   cup or 4 fl oz (118 mL) fruit juice.  1 cup or 8 fl oz (237 mL) milk.  1 small or 4 oz (106 g) apple.   or 2 oz (63 g) of a medium banana.  1 cup or 5 oz (150 g) strawberries.  3 cups or 1 oz (24 g) popped popcorn. What is an example of carbohydrate counting? To calculate the number of carbohydrates in this sample meal, follow the steps shown below. Sample meal  3 oz (85 g) chicken  breast.  ? cup or 4 oz (106 g) brown rice.   cup or 3 oz (85 g) corn.  1 cup or 8 fl oz (237 mL) milk.  1 cup or 5 oz (150 g) strawberries with sugar-free whipped topping. Carbohydrate calculation 1. Identify the foods that contain carbohydrates: ? Rice. ? Corn. ? Milk. ? Strawberries. 2. Calculate how many servings you have of each food: ? 2 servings rice. ? 1 serving corn. ? 1 serving milk. ? 1 serving strawberries. 3. Multiply each number of servings by 15 g: ? 2 servings rice x 15 g = 30 g. ? 1 serving corn x 15 g = 15 g. ? 1 serving milk x 15 g = 15 g. ? 1 serving strawberries x 15 g = 15 g. 4. Add together all of the amounts to find the   total grams of carbohydrates eaten: ? 30 g + 15 g + 15 g + 15 g = 75 g of carbohydrates total. What are tips for following this plan? Shopping  Develop a meal plan and then make a shopping list.  Buy fresh and frozen vegetables, fresh and frozen fruit, dairy, eggs, beans, lentils, and whole grains.  Look at food labels. Choose foods that have more fiber and less sugar.  Avoid processed foods and foods with added sugars. Meal planning  Aim to have the same amount of carbohydrates at each meal and for each snack time.  Plan to have regular, balanced meals and snacks. Where to find more information  American Diabetes Association: www.diabetes.org  Centers for Disease Control and Prevention: www.cdc.gov Summary  Carbohydrate counting is a method of keeping track of how many carbohydrates you eat.  Eating carbohydrates naturally increases the amount of sugar (glucose) in the blood.  Counting how many carbohydrates you eat improves your blood glucose control, which helps you manage your diabetes.  A dietitian can help you make a meal plan and calculate how many carbohydrates you should have at each meal and snack. This information is not intended to replace advice given to you by your health care provider. Make sure you discuss  any questions you have with your health care provider. Document Revised: 08/08/2019 Document Reviewed: 08/09/2019 Elsevier Patient Education  2021 [employer]  

## 2020-11-17 ENCOUNTER — Other Ambulatory Visit: Payer: Self-pay | Admitting: Gerontology

## 2020-11-17 DIAGNOSIS — E1142 Type 2 diabetes mellitus with diabetic polyneuropathy: Secondary | ICD-10-CM

## 2020-11-17 DIAGNOSIS — IMO0002 Reserved for concepts with insufficient information to code with codable children: Secondary | ICD-10-CM

## 2020-11-17 MED ORDER — GABAPENTIN 400 MG PO CAPS: 1200.0000 mg | ORAL_CAPSULE | Freq: Three times a day (TID) | ORAL | 2 refills | Status: DC

## 2020-11-20 LAB — HM DIABETES EYE EXAM

## 2020-12-15 ENCOUNTER — Other Ambulatory Visit: Payer: Self-pay

## 2020-12-15 MED FILL — Hydrochlorothiazide Cap 12.5 MG: ORAL | 30 days supply | Qty: 60 | Fill #0 | Status: AC

## 2020-12-15 MED FILL — Insulin Pen Needle 32 G X 4 MM (1/6" or 5/32"): 50 days supply | Qty: 100 | Fill #0 | Status: AC

## 2020-12-15 MED FILL — Insulin Glargine Soln Pen-Injector 100 Unit/ML: SUBCUTANEOUS | 30 days supply | Qty: 15 | Fill #0 | Status: AC

## 2020-12-15 MED FILL — Gabapentin Cap 400 MG: ORAL | 30 days supply | Qty: 270 | Fill #0 | Status: AC

## 2021-01-05 ENCOUNTER — Ambulatory Visit: Payer: Medicaid Other

## 2021-01-06 ENCOUNTER — Other Ambulatory Visit: Payer: Self-pay

## 2021-01-11 ENCOUNTER — Other Ambulatory Visit: Payer: Self-pay | Admitting: Gerontology

## 2021-01-11 ENCOUNTER — Other Ambulatory Visit: Payer: Self-pay | Admitting: Family Medicine

## 2021-01-11 ENCOUNTER — Other Ambulatory Visit: Payer: Self-pay

## 2021-01-11 DIAGNOSIS — IMO0002 Reserved for concepts with insufficient information to code with codable children: Secondary | ICD-10-CM

## 2021-01-11 DIAGNOSIS — I10 Essential (primary) hypertension: Secondary | ICD-10-CM

## 2021-01-11 DIAGNOSIS — E1142 Type 2 diabetes mellitus with diabetic polyneuropathy: Secondary | ICD-10-CM

## 2021-01-11 MED FILL — Insulin Glargine Soln Pen-Injector 100 Unit/ML: SUBCUTANEOUS | 30 days supply | Qty: 15 | Fill #1 | Status: AC

## 2021-01-11 MED FILL — Gabapentin Cap 400 MG: ORAL | 30 days supply | Qty: 270 | Fill #1 | Status: CN

## 2021-01-12 ENCOUNTER — Other Ambulatory Visit: Payer: Self-pay | Admitting: Gerontology

## 2021-01-12 ENCOUNTER — Other Ambulatory Visit: Payer: Self-pay

## 2021-01-12 DIAGNOSIS — I10 Essential (primary) hypertension: Secondary | ICD-10-CM

## 2021-01-12 MED ORDER — HYDROCHLOROTHIAZIDE 12.5 MG PO CAPS
ORAL_CAPSULE | Freq: Every day | ORAL | 1 refills | Status: DC
  Filled 2021-01-12 – 2021-01-13 (×2): qty 60, 30d supply, fill #0
  Filled 2021-02-11: qty 60, 30d supply, fill #1

## 2021-01-12 MED ORDER — METFORMIN HCL 1000 MG PO TABS
ORAL_TABLET | Freq: Two times a day (BID) | ORAL | 0 refills | Status: AC
  Filled 2021-01-12 – 2021-01-13 (×2): qty 60, 30d supply, fill #0
  Filled 2021-02-11: qty 60, 30d supply, fill #1
  Filled 2021-03-26: qty 60, 30d supply, fill #2

## 2021-01-12 MED FILL — Lisinopril Tab 20 MG: ORAL | Qty: 90 | Fill #0 | Status: CN

## 2021-01-13 ENCOUNTER — Other Ambulatory Visit: Payer: Self-pay

## 2021-01-13 MED FILL — Gabapentin Cap 400 MG: ORAL | 30 days supply | Qty: 270 | Fill #1 | Status: AC

## 2021-01-13 MED FILL — Lisinopril Tab 20 MG: ORAL | 30 days supply | Qty: 30 | Fill #0 | Status: AC

## 2021-01-14 ENCOUNTER — Other Ambulatory Visit: Payer: Self-pay

## 2021-01-21 ENCOUNTER — Other Ambulatory Visit: Payer: Medicaid Other

## 2021-01-21 ENCOUNTER — Other Ambulatory Visit: Payer: Self-pay

## 2021-01-21 DIAGNOSIS — IMO0002 Reserved for concepts with insufficient information to code with codable children: Secondary | ICD-10-CM

## 2021-01-21 DIAGNOSIS — E1165 Type 2 diabetes mellitus with hyperglycemia: Secondary | ICD-10-CM

## 2021-01-22 LAB — HEMOGLOBIN A1C
Est. average glucose Bld gHb Est-mCnc: 206 mg/dL
Hgb A1c MFr Bld: 8.8 % — ABNORMAL HIGH (ref 4.8–5.6)

## 2021-01-28 ENCOUNTER — Other Ambulatory Visit: Payer: Self-pay

## 2021-01-28 ENCOUNTER — Ambulatory Visit: Payer: Medicaid Other | Admitting: Gerontology

## 2021-01-28 ENCOUNTER — Encounter: Payer: Self-pay | Admitting: Gerontology

## 2021-01-28 VITALS — BP 111/76 | HR 98 | Temp 96.4°F | Resp 20 | Ht 68.0 in | Wt 346.5 lb

## 2021-01-28 DIAGNOSIS — Z0000 Encounter for general adult medical examination without abnormal findings: Secondary | ICD-10-CM

## 2021-01-28 DIAGNOSIS — E785 Hyperlipidemia, unspecified: Secondary | ICD-10-CM

## 2021-01-28 DIAGNOSIS — I252 Old myocardial infarction: Secondary | ICD-10-CM

## 2021-01-28 DIAGNOSIS — I10 Essential (primary) hypertension: Secondary | ICD-10-CM

## 2021-01-28 DIAGNOSIS — IMO0002 Reserved for concepts with insufficient information to code with codable children: Secondary | ICD-10-CM

## 2021-01-28 MED ORDER — OZEMPIC (0.25 OR 0.5 MG/DOSE) 2 MG/1.5ML ~~LOC~~ SOPN
PEN_INJECTOR | SUBCUTANEOUS | 1 refills | Status: DC
  Filled 2021-01-28: qty 6, fill #0
  Filled 2021-03-26: qty 1.5, 42d supply, fill #0
  Filled 2021-05-07: qty 1.5, 28d supply, fill #1
  Filled 2021-06-04: qty 4.5, 90d supply, fill #2

## 2021-01-28 MED ORDER — METOPROLOL TARTRATE 25 MG PO TABS
25.0000 mg | ORAL_TABLET | Freq: Two times a day (BID) | ORAL | 2 refills | Status: DC
  Filled 2021-01-28: qty 60, 30d supply, fill #0

## 2021-01-28 MED ORDER — ROSUVASTATIN CALCIUM 40 MG PO TABS
ORAL_TABLET | Freq: Every day | ORAL | 2 refills | Status: AC
  Filled 2021-01-28: qty 30, fill #0
  Filled 2021-02-11: qty 30, 30d supply, fill #0
  Filled 2021-03-26: qty 30, 30d supply, fill #1

## 2021-01-28 NOTE — Patient Instructions (Signed)
[email or web address].[email or web address]">  DASH Eating Plan DASH stands for Dietary Approaches to Stop Hypertension. The DASH eating plan is a healthy eating plan that has been shown to: Reduce high blood pressure (hypertension). Reduce your risk for type 2 diabetes, heart disease, and stroke. Help with weight loss. What are tips for following this plan? Reading food labels Check food labels for the amount of salt (sodium) per serving. Choose foods with less than 5 percent of the Daily Value of sodium. Generally, foods with less than 300 milligrams (mg) of sodium per serving fit into this eating plan. To find whole grains, look for the word "whole" as the first word in the ingredient list. Shopping Buy products labeled as "low-sodium" or "no salt added." Buy fresh foods. Avoid canned foods and pre-made or frozen meals. Cooking Avoid adding salt when cooking. Use salt-free seasonings or herbs instead of table salt or sea salt. Check with your health care provider or pharmacist before using salt substitutes. Do not fry foods. Cook foods using healthy methods such as baking, boiling, grilling, roasting, and broiling instead. Cook with heart-healthy oils, such as olive, canola, avocado, soybean, or sunflower oil. Meal planning Eat a balanced diet that includes: 4 or more servings of fruits and 4 or more servings of vegetables each day. Try to fill one-half of your plate with fruits and vegetables. 6-8 servings of whole grains each day. Less than 6 oz (170 g) of lean meat, poultry, or fish each day. A 3-oz (85-g) serving of meat is about the same size as a deck of cards. One egg equals 1 oz (28 g). 2-3 servings of low-fat dairy each day. One serving is 1 cup (237 mL). 1 serving of nuts, seeds, or beans 5 times each week. 2-3 servings of heart-healthy fats. Healthy fats called omega-3 fatty acids are found in foods such as walnuts, flaxseeds, fortified milks, and eggs.  These fats are also found in cold-water fish, such as sardines, salmon, and mackerel. Limit how much you eat of: Canned or prepackaged foods. Food that is high in trans fat, such as some fried foods. Food that is high in saturated fat, such as fatty meat. Desserts and other sweets, sugary drinks, and other foods with added sugar. Full-fat dairy products. Do not salt foods before eating. Do not eat more than 4 egg yolks a week. Try to eat at least 2 vegetarian meals a week. Eat more home-cooked food and less restaurant, buffet, and fast food.   Lifestyle When eating at a restaurant, ask that your food be prepared with less salt or no salt, if possible. If you drink alcohol: Limit how much you use to: 0-1 drink a day for women who are not pregnant. 0-2 drinks a day for men. Be aware of how much alcohol is in your drink. In the [place] one drink equals one 12 oz bottle of beer (355 mL), one 5 oz glass of wine (148 mL), or one 1 oz glass of hard liquor (44 mL). General information Avoid eating more than 2,300 mg of salt a day. If you have hypertension, you may need to reduce your sodium intake to 1,500 mg a day. Work with your health care provider to maintain a healthy body weight or to lose weight. Ask what an ideal weight is for you. Get at least 30 minutes of exercise that causes your heart to beat faster (aerobic exercise) most days of the week. Activities may include walking, swimming, or biking. Work with your health care provider  or dietitian to adjust your eating plan to your individual calorie needs. What foods should I eat? Fruits All fresh, dried, or frozen fruit. Canned fruit in natural juice (without added sugar). Vegetables Fresh or frozen vegetables (raw, steamed, roasted, or grilled). Low-sodium or reduced-sodium tomato and vegetable juice. Low-sodium or reduced-sodium tomato sauce and tomato paste. Low-sodium or reduced-sodium canned vegetables. Grains Whole-grain or  whole-wheat bread. Whole-grain or whole-wheat pasta. Brown rice. Orpah Cobb. Bulgur. Whole-grain and low-sodium cereals. Pita bread. Low-fat, low-sodium crackers. Whole-wheat flour tortillas. Meats and other proteins Skinless chicken or [place]. Ground chicken or [place]. Pork with fat trimmed off. Fish and seafood. Egg whites. Dried beans, peas, or lentils. Unsalted nuts, nut butters, and seeds. Unsalted canned beans. Lean cuts of beef with fat trimmed off. Low-sodium, lean precooked or cured meat, such as sausages or meat loaves. Dairy Low-fat (1%) or fat-free (skim) milk. Reduced-fat, low-fat, or fat-free cheeses. Nonfat, low-sodium ricotta or cottage cheese. Low-fat or nonfat yogurt. Low-fat, low-sodium cheese. Fats and oils Soft margarine without trans fats. Vegetable oil. Reduced-fat, low-fat, or light mayonnaise and salad dressings (reduced-sodium). Canola, safflower, olive, avocado, soybean, and sunflower oils. Avocado. Seasonings and condiments Herbs. Spices. Seasoning mixes without salt. Other foods Unsalted popcorn and pretzels. Fat-free sweets. The items listed above may not be a complete list of foods and beverages you can eat. Contact a dietitian for more information. What foods should I avoid? Fruits Canned fruit in a light or heavy syrup. Fried fruit. Fruit in cream or butter sauce. Vegetables Creamed or fried vegetables. Vegetables in a cheese sauce. Regular canned vegetables (not low-sodium or reduced-sodium). Regular canned tomato sauce and paste (not low-sodium or reduced-sodium). Regular tomato and vegetable juice (not low-sodium or reduced-sodium). Rosita Fire. Olives. Grains Baked goods made with fat, such as croissants, muffins, or some breads. Dry pasta or rice meal packs. Meats and other proteins Fatty cuts of meat. Ribs. Fried meat. Tomasa Blase. Bologna, salami, and other precooked or cured meats, such as sausages or meat loaves. Fat from the back of a pig (fatback).  Bratwurst. Salted nuts and seeds. Canned beans with added salt. Canned or smoked fish. Whole eggs or egg yolks. Chicken or [place] with skin. Dairy Whole or 2% milk, cream, and half-and-half. Whole or full-fat cream cheese. Whole-fat or sweetened yogurt. Full-fat cheese. Nondairy creamers. Whipped toppings. Processed cheese and cheese spreads. Fats and oils Butter. Stick margarine. Lard. Shortening. Ghee. Bacon fat. Tropical oils, such as coconut, palm kernel, or palm oil. Seasonings and condiments Onion salt, garlic salt, seasoned salt, table salt, and sea salt. Worcestershire sauce. Tartar sauce. Barbecue sauce. Teriyaki sauce. Soy sauce, including reduced-sodium. Steak sauce. Canned and packaged gravies. Fish sauce. Oyster sauce. Cocktail sauce. Store-bought horseradish. Ketchup. Mustard. Meat flavorings and tenderizers. Bouillon cubes. Hot sauces. Pre-made or packaged marinades. Pre-made or packaged taco seasonings. Relishes. Regular salad dressings. Other foods Salted popcorn and pretzels. The items listed above may not be a complete list of foods and beverages you should avoid. Contact a dietitian for more information. Where to find more information National Heart, Lung, and Blood Institute: PopSteam.is American Heart Association: www.heart.org Academy of Nutrition and Dietetics: www.eatright.org National Kidney Foundation: www.kidney.org Summary The DASH eating plan is a healthy eating plan that has been shown to reduce high blood pressure (hypertension). It may also reduce your risk for type 2 diabetes, heart disease, and stroke. When on the DASH eating plan, aim to eat more fresh fruits and vegetables, whole grains, lean proteins, low-fat dairy, and heart-healthy fats.  With the DASH eating plan, you should limit salt (sodium) intake to 2,300 mg a day. If you have hypertension, you may need to reduce your sodium intake to 1,500 mg a day. Work with your health care provider or  dietitian to adjust your eating plan to your individual calorie needs. This information is not intended to replace advice given to you by your health care provider. Make sure you discuss any questions you have with your health care provider. Document Revised: 07/12/2019 Document Reviewed: 07/12/2019 Elsevier Patient Education  2021 Elsevier Inc. https://www.diabeteseducator.org/docs/default-source/living-with-diabetes/conquering-the-grocery-store-v1.pdf?sfvrsn=4">  Carbohydrate Counting for Diabetes Mellitus, Adult Carbohydrate counting is a method of keeping track of how many carbohydrates you eat. Eating carbohydrates naturally increases the amount of sugar (glucose) in the blood. Counting how many carbohydrates you eat improves your blood glucose control, which helps you manage your diabetes. It is important to know how many carbohydrates you can safely have in each meal. This is different for every person. A dietitian can help you make a meal plan and calculate how many carbohydrates you should have at each meal and snack. What foods contain carbohydrates? Carbohydrates are found in the following foods: Grains, such as breads and cereals. Dried beans and soy products. Starchy vegetables, such as potatoes, peas, and corn. Fruit and fruit juices. Milk and yogurt. Sweets and snack foods, such as cake, cookies, candy, chips, and soft drinks.   How do I count carbohydrates in foods? There are two ways to count carbohydrates in food. You can read food labels or learn standard serving sizes of foods. You can use either of the methods or a combination of both. Using the Nutrition Facts label The Nutrition Facts list is included on the labels of almost all packaged foods and beverages in the [place] It includes: The serving size. Information about nutrients in each serving, including the grams (g) of carbohydrate per serving. To use the Nutrition Facts: Decide how many servings you will  have. Multiply the number of servings by the number of carbohydrates per serving. The resulting number is the total amount of carbohydrates that you will be having. Learning the standard serving sizes of foods When you eat carbohydrate foods that are not packaged or do not include Nutrition Facts on the label, you need to measure the servings in order to count the amount of carbohydrates. Measure the foods that you will eat with a food scale or measuring cup, if needed. Decide how many standard-size servings you will eat. Multiply the number of servings by 15. For foods that contain carbohydrates, one serving equals 15 g of carbohydrates. For example, if you eat 2 cups or 10 oz (300 g) of strawberries, you will have eaten 2 servings and 30 g of carbohydrates (2 servings x 15 g = 30 g). For foods that have more than one food mixed, such as soups and casseroles, you must count the carbohydrates in each food that is included. The following list contains standard serving sizes of common carbohydrate-rich foods. Each of these servings has about 15 g of carbohydrates: 1 slice of bread. 1 six-inch (15 cm) tortilla. ? cup or 2 oz (53 g) cooked rice or pasta.  cup or 3 oz (85 g) cooked or canned, drained and rinsed beans or lentils.  cup or 3 oz (85 g) starchy vegetable, such as peas, corn, or squash.  cup or 4 oz (120 g) hot cereal.  cup or 3 oz (85 g) boiled or mashed potatoes, or  or 3 oz (85  g) of a large baked potato.  cup or 4 fl oz (118 mL) fruit juice. 1 cup or 8 fl oz (237 mL) milk. 1 small or 4 oz (106 g) apple.  or 2 oz (63 g) of a medium banana. 1 cup or 5 oz (150 g) strawberries. 3 cups or 1 oz (24 g) popped popcorn. What is an example of carbohydrate counting? To calculate the number of carbohydrates in this sample meal, follow the steps shown below. Sample meal 3 oz (85 g) chicken breast. ? cup or 4 oz (106 g) brown rice.  cup or 3 oz (85 g) corn. 1 cup or 8 fl oz (237 mL)  milk. 1 cup or 5 oz (150 g) strawberries with sugar-free whipped topping. Carbohydrate calculation Identify the foods that contain carbohydrates: Rice. Corn. Milk. Strawberries. Calculate how many servings you have of each food: 2 servings rice. 1 serving corn. 1 serving milk. 1 serving strawberries. Multiply each number of servings by 15 g: 2 servings rice x 15 g = 30 g. 1 serving corn x 15 g = 15 g. 1 serving milk x 15 g = 15 g. 1 serving strawberries x 15 g = 15 g. Add together all of the amounts to find the total grams of carbohydrates eaten: 30 g + 15 g + 15 g + 15 g = 75 g of carbohydrates total. What are tips for following this plan? Shopping Develop a meal plan and then make a shopping list. Buy fresh and frozen vegetables, fresh and frozen fruit, dairy, eggs, beans, lentils, and whole grains. Look at food labels. Choose foods that have more fiber and less sugar. Avoid processed foods and foods with added sugars. Meal planning Aim to have the same amount of carbohydrates at each meal and for each snack time. Plan to have regular, balanced meals and snacks. Where to find more information American Diabetes Association: www.diabetes.org Centers for Disease Control and Prevention: FootballExhibition.com.br Summary Carbohydrate counting is a method of keeping track of how many carbohydrates you eat. Eating carbohydrates naturally increases the amount of sugar (glucose) in the blood. Counting how many carbohydrates you eat improves your blood glucose control, which helps you manage your diabetes. A dietitian can help you make a meal plan and calculate how many carbohydrates you should have at each meal and snack. This information is not intended to replace advice given to you by your health care provider. Make sure you discuss any questions you have with your health care provider. Document Revised: 08/08/2019 Document Reviewed: 08/09/2019 Elsevier Patient Education  2021 [employer].

## 2021-01-28 NOTE — Progress Notes (Signed)
Established Patient Office Visit  Subjective:  Patient ID: Luis [name], male    DOB: July 09, 1971  Age: 50 y.o. MRN: [id number]  CC:  Chief Complaint  Patient presents with   Follow-up   Diabetes    A1c done 01/21/21, FBS 109    HPI Luis [name] is a 50 year old male who has a history T2DM, MI, HTN, Hyperlipidemia who presents for follow up of T2DM and lab review. His HgbA1c done on 01/21/21 decreased from 9.9% to 8.8%. He reports he checks his blood glucose twice a day, his fasting blood glucose ranges between 100- 120 mg/dl and his PM blood glucose ranges between 120 -160 mg/dl. Blood glucose check today was 202 mg/dl, he reports that he had breakfast just before his appointment. He states that he is compliant with his medications and adheres to low carb diet. He did admit that he has not been taking Ozempic that was previously ordered. He denies hypo/hyperglycemic symptoms, still reports concern with his peripheral neuropathy. He reports that he performs daily foot check.  His heart rate is irregular, but he denies chest pain, palpitation, and dizziness. Overall, he states that he is doing well and offers no further complaints.  Past Medical History:  Diagnosis Date   ACS (acute coronary syndrome) (HCC) 05/20/2019   Back pain    Diabetes mellitus without complication (HCC)    Drug addiction in remission (HCC)    High cholesterol    Hypertension    Knee pain    Myocardial infarction (HCC) 05/2019   Neuropathy     Past Surgical History:  Procedure Laterality Date   HERNIA REPAIR     KNEE SURGERY Right    LEFT HEART CATH AND CORONARY ANGIOGRAPHY N/A 05/21/2019   Procedure: LEFT HEART CATH AND CORONARY ANGIOGRAPHY;  Surgeon: Marcina Millard, MD;  Location: [facility];  Service: Cardiovascular;  Laterality: N/A;    Family History  Problem Relation Age of Onset   Cancer Mother        stomach   Hyperlipidemia Father    Hypertension Father    Heart disease  Father    Heart disease Brother        stent x 1   Heart disease Paternal Grandfather    Diabetes Paternal Grandfather     Social History   Socioeconomic History   Marital status: Divorced    Spouse name: Not on file   Number of children: 2   Years of education: Not on file   Highest education level: High school graduate  Occupational History   Occupation: unemployed  Tobacco Use   Smoking status: Every Day    Packs/day: 1.00    Years: 35.00    Pack years: 35.00    Types: Cigarettes   Smokeless tobacco: Former    Types: Chew    Quit date: 2007  Vaping Use   Vaping Use: Never used  Substance and Sexual Activity   Alcohol use: Not Currently    Comment: occassional, 2-3 per month   Drug use: Not Currently    Types: Marijuana, Methamphetamines    Comment: quit 2000   Sexual activity: Not Currently    Birth control/protection: Condom  Other Topics Concern   Not on file  Social History Narrative   On food stamps, which helps with food. Living with mom as has been unemployed due to nueropathy.   Social Determinants of Health   Financial Resource Strain: Not on file  Food Insecurity:  No Food Insecurity   Worried About Programme researcher, broadcasting/film/video in the Last Year: Never true   Ran Out of Food in the Last Year: Never true  Transportation Needs: No Transportation Needs   Lack of Transportation (Medical): No   Lack of Transportation (Non-Medical): No  Physical Activity: Not on file  Stress: Not on file  Social Connections: Not on file  Intimate Partner Violence: Not on file    Outpatient Medications Prior to Visit  Medication Sig Dispense Refill   aspirin EC 81 MG EC tablet Take 1 tablet (81 mg total) by mouth daily. 30 tablet 0   COMFORT EZ PEN NEEDLES 32G X 4 MM MISC USE AS DIRECTED WITH BASAGLAR 50 each 11   ezetimibe (ZETIA) 10 MG tablet Take 1 tablet (10 mg total) by mouth daily. 30 tablet 2   gabapentin (NEURONTIN) 400 MG capsule TAKE 3 CAPSULES BY MOUTH 3 TIMES A DAY  270 capsule 2   glimepiride (AMARYL) 4 MG tablet TAKE ONE TABLET BY MOUTH EVERY DAY 30 tablet 2   glucose blood ([facility] GLUCOSE METER TEST STRIPS) test strip Use as instructed 100 each 12   hydrochlorothiazide (MICROZIDE) 12.5 MG capsule TAKE 2 CAPSULES (25MG  TOTAL) BY MOUTH EVERY DAY. 60 capsule 1   ibuprofen (ADVIL,MOTRIN) 200 MG tablet Take 200-400 mg by mouth every 6 (six) hours as needed for fever or mild pain.      Insulin Glargine (BASAGLAR KWIKPEN) 100 UNIT/ML INJECT 25 UNITS UNDER THE SKIN 2 TIMES A DAY 5 mL 5   Lancets (FREESTYLE) lancets Use as instructed 100 each 12   lisinopril (ZESTRIL) 20 MG tablet TAKE ONE TABLET BY MOUTH EVERY DAY 90 tablet 1   metFORMIN (GLUCOPHAGE) 1000 MG tablet TAKE ONE TABLET BY MOUTH 2 TIMES A DAY 180 tablet 0   methadone (DOLOPHINE) 10 MG/5ML solution Take 140 mg by mouth daily.      rosuvastatin (CRESTOR) 40 MG tablet TAKE ONE TABLET BY MOUTH EVERY DAY 30 tablet 2   clopidogrel (PLAVIX) 75 MG tablet Take 1 tablet by mouth daily. (Patient not taking: No sig reported)     metoprolol tartrate (LOPRESSOR) 25 MG tablet Take 25 mg by mouth 2 (two) times daily. (Patient not taking: Reported on 01/28/2021)     OZEMPIC, 0.25 OR 0.5 MG/DOSE, 2 MG/1.5ML SOPN INJECT 0.25MG  INTO THE SKIN ONCE A WEEK FOR 28 DAYS. THEN 0.5MG  ONCE A WEEK. (Patient not taking: Reported on 01/28/2021) 6 mL 1   No facility-administered medications prior to visit.    No Known Allergies  ROS Review of Systems  Constitutional: Negative.   Eyes: Negative.   Respiratory: Negative.    Cardiovascular: Negative.   Gastrointestinal: Negative.   Endocrine: Negative.   Skin: Negative.   Neurological:  Positive for numbness (peripheral neuropathy). Negative for dizziness and headaches.  Psychiatric/Behavioral: Negative.       Objective:    Physical Exam Constitutional:      Appearance: He is obese.  HENT:     Head: Normocephalic.  Cardiovascular:     Rate and Rhythm: Normal rate. Rhythm  irregular.     Pulses: Normal pulses.  Pulmonary:     Effort: Pulmonary effort is normal.  Abdominal:     General: Bowel sounds are normal.  Skin:    General: Skin is warm and dry.  Neurological:     Mental Status: He is alert.    BP 111/76 (BP Location: Right Arm, Patient Position: Sitting, Cuff Size: Large)  Pulse 98   Temp (!) 96.4 F (35.8 C)   Resp 20   Ht 5\' 8"  (1.727 m)   Wt (!) 346 lb 8 oz (157.2 kg)   SpO2 93%   BMI 52.69 kg/m  Wt Readings from Last 3 Encounters:  01/28/21 (!) 346 lb 8 oz (157.2 kg)  11/05/20 (!) 358 lb 4.8 oz (162.5 kg)  07/28/20 (!) 355 lb 4.8 oz (161.2 kg)  Weight Loss encouraged   Health Maintenance Due  Topic Date Due   COVID-19 Vaccine (1) Never done   Pneumococcal Vaccine 40-72 Years old (1 - PCV) Never done   HIV Screening  Never done   Hepatitis C Screening  Never done   TETANUS/TDAP  Never done   Zoster Vaccines- Shingrix (1 of 2) Never done   COLONOSCOPY (Pts 45-83yrs Insurance coverage will need to be confirmed)  Never done    There are no preventive care reminders to display for this patient.  Lab Results  Component Value Date   TSH 2.920 03/26/2020   Lab Results  Component Value Date   WBC 12.5 (H) 10/23/2019   HGB 15.2 10/23/2019   HCT 45.4 10/23/2019   MCV 84 10/23/2019   PLT 311 08/01/2019   Lab Results  Component Value Date   NA 135 07/08/2020   K 4.9 07/08/2020   CO2 25 07/08/2020   GLUCOSE 231 (H) 07/08/2020   BUN 13 07/08/2020   CREATININE 0.66 (L) 07/08/2020   BILITOT 0.3 03/18/2020   ALKPHOS 94 03/18/2020   AST 11 03/18/2020   ALT 18 03/18/2020   PROT 6.9 03/18/2020   ALBUMIN 4.0 03/18/2020   CALCIUM 9.2 07/08/2020   ANIONGAP 12 05/22/2019   Lab Results  Component Value Date   CHOL 181 10/22/2020   Lab Results  Component Value Date   HDL 28 (L) 10/22/2020   Lab Results  Component Value Date   LDLCALC 120 (H) 10/22/2020   Lab Results  Component Value Date   TRIG 184 (H) 10/22/2020    Lab Results  Component Value Date   CHOLHDL 6.5 (H) 10/22/2020   Lab Results  Component Value Date   HGBA1C 8.8 (H) 01/21/2021      Assessment & Plan:   1. Uncontrolled type 2 diabetes mellitus with diabetic polyneuropathy, with long-term current use of insulin (HCC) Last HgbA1c was 8.8%, goal is less than 7% He will continue on current regimen He was advised to start Ozempic as reordered Continue on low carb. Non concentrated sweets and exercise as tolerated - OZEMPIC, 0.25 OR 0.5 MG/DOSE, 2 MG/1.5ML SOPN; INJECT 0.25MG  INTO THE SKIN ONCE A WEEK FOR 28 DAYS. THEN 0.5MG  ONCE A WEEK.  Dispense: 6 mL; Refill: 1 - HgB A1c; Future  2. Dyslipidemia Continue current medication regimen, low fat/cholesterol diet and exercise as tolerated. - rosuvastatin (CRESTOR) 40 MG tablet; TAKE ONE TABLET BY MOUTH EVERY DAY  Dispense: 30 tablet; Refill: 2  3. Essential hypertension Blood pressure is under control, 111/76,  Continue on DASH diet and medication regimen - metoprolol tartrate (LOPRESSOR) 25 MG tablet; Take 1 tablet (25 mg total) by mouth 2 (two) times daily.  Dispense: 60 tablet; Refill: 2  4. Health care maintenance - He was encouraged to complete [facility] financial application for  - Ambulatory referral to Gastroenterology for Colonoscopy screening  5. History of non-ST elevation myocardial infarction (NSTEMI) -He will complete [facility] financial application for Ambulatory referral to Cardiology      Follow-up: Return in about  3 months (around 05/05/2021).    Clance Boll, RN

## 2021-02-09 ENCOUNTER — Other Ambulatory Visit: Payer: Self-pay | Admitting: Gerontology

## 2021-02-09 DIAGNOSIS — IMO0002 Reserved for concepts with insufficient information to code with codable children: Secondary | ICD-10-CM

## 2021-02-09 DIAGNOSIS — E1142 Type 2 diabetes mellitus with diabetic polyneuropathy: Secondary | ICD-10-CM

## 2021-02-10 ENCOUNTER — Other Ambulatory Visit: Payer: Self-pay

## 2021-02-10 MED ORDER — GABAPENTIN 400 MG PO CAPS
ORAL_CAPSULE | ORAL | 1 refills | Status: DC
  Filled 2021-02-10: qty 270, fill #0
  Filled 2021-02-11: qty 270, 30d supply, fill #0
  Filled 2021-03-10 – 2021-03-11 (×2): qty 270, 30d supply, fill #1

## 2021-02-11 ENCOUNTER — Other Ambulatory Visit: Payer: Self-pay

## 2021-02-11 ENCOUNTER — Other Ambulatory Visit: Payer: Self-pay | Admitting: Gerontology

## 2021-02-11 ENCOUNTER — Telehealth: Payer: Self-pay | Admitting: Pharmacy Technician

## 2021-02-11 DIAGNOSIS — IMO0002 Reserved for concepts with insufficient information to code with codable children: Secondary | ICD-10-CM

## 2021-02-11 DIAGNOSIS — E1165 Type 2 diabetes mellitus with hyperglycemia: Secondary | ICD-10-CM

## 2021-02-11 MED FILL — Insulin Glargine Soln Pen-Injector 100 Unit/ML: SUBCUTANEOUS | 30 days supply | Qty: 15 | Fill #0 | Status: AC

## 2021-02-11 MED FILL — Glimepiride Tab 4 MG: ORAL | 30 days supply | Qty: 30 | Fill #0 | Status: AC

## 2021-02-11 NOTE — Telephone Encounter (Signed)
Received updated proof of income.  Patient eligible to receive medication assistance at [facility] until time for re-certification in 2023, and as long as eligibility requirements continue to be met.  Luis [name] Care Manager [facility]  

## 2021-02-12 ENCOUNTER — Other Ambulatory Visit: Payer: Self-pay

## 2021-02-12 ENCOUNTER — Other Ambulatory Visit: Payer: Self-pay | Admitting: Gerontology

## 2021-02-12 ENCOUNTER — Ambulatory Visit: Payer: Medicaid Other | Admitting: Pharmacy Technician

## 2021-02-12 DIAGNOSIS — IMO0002 Reserved for concepts with insufficient information to code with codable children: Secondary | ICD-10-CM

## 2021-02-12 DIAGNOSIS — E1165 Type 2 diabetes mellitus with hyperglycemia: Secondary | ICD-10-CM

## 2021-02-12 DIAGNOSIS — Z79899 Other long term (current) drug therapy: Secondary | ICD-10-CM

## 2021-02-12 NOTE — Progress Notes (Signed)
Completed [facility] application and contract for re-certification.  Patient agreed to all terms of the [facility] contract.    Patient approved to receive medication assistance until time for re-certification in 7357, and as long as eligibility criteria continues to be met.  Provided patient with Civil engineer, contracting based on his particular needs.    [facility]

## 2021-02-17 ENCOUNTER — Other Ambulatory Visit: Payer: Self-pay

## 2021-02-18 ENCOUNTER — Other Ambulatory Visit: Payer: Self-pay

## 2021-02-26 ENCOUNTER — Telehealth: Payer: Self-pay | Admitting: Pharmacist

## 2021-02-26 NOTE — Telephone Encounter (Signed)
02/26/2021 10:37:40 AM - Ozempic faxed to [employer]  -- Rhetta Mura - Friday, February 26, 2021 10:35 AM --[employer] application for [employer] 0.25mg  weekly for 28 days, then inject 0.5mg  weekly  # 4 boxes.

## 2021-03-09 ENCOUNTER — Ambulatory Visit: Payer: Medicaid Other

## 2021-03-10 ENCOUNTER — Other Ambulatory Visit: Payer: Self-pay

## 2021-03-11 ENCOUNTER — Other Ambulatory Visit: Payer: Self-pay

## 2021-03-15 ENCOUNTER — Encounter: Payer: Self-pay | Admitting: Pharmacist

## 2021-03-15 ENCOUNTER — Other Ambulatory Visit: Payer: Self-pay

## 2021-03-15 ENCOUNTER — Ambulatory Visit: Payer: Medicaid Other | Admitting: Pharmacist

## 2021-03-15 DIAGNOSIS — Z79899 Other long term (current) drug therapy: Secondary | ICD-10-CM

## 2021-03-15 NOTE — Progress Notes (Signed)
Medication Management Clinic Visit Note  Patient: Luis [name] MRN: [id number] Date of Birth: 05/11/1971 PCP: Luis Gala, NP   Luis [name] 50 y.o. male presents for a medication therapy management visit today via telephone. Pt was identified via name and DOB.   There were no vitals taken for this visit.  Patient Information   Past Medical History:  Diagnosis Date   ACS (acute coronary syndrome) (HCC) 05/20/2019   Back pain    Diabetes mellitus without complication (HCC)    Drug addiction in remission (HCC)    High cholesterol    Hypertension    Knee pain    Myocardial infarction (HCC) 05/2019   Neuropathy       Past Surgical History:  Procedure Laterality Date   HERNIA REPAIR     KNEE SURGERY Right    LEFT HEART CATH AND CORONARY ANGIOGRAPHY N/A 05/21/2019   Procedure: LEFT HEART CATH AND CORONARY ANGIOGRAPHY;  Surgeon: Marcina Millard, MD;  Location: [facility] INVASIVE CV LAB;  Service: Cardiovascular;  Laterality: N/A;     Family History  Problem Relation Age of Onset   Cancer Mother        stomach, currently in remission reported on 03/15/21   Hyperlipidemia Father    Hypertension Father    Heart disease Father    Heart disease Brother        stent x 1   Heart attack Brother    Heart disease Paternal Grandfather    Diabetes Paternal Grandfather     New Diagnoses (since last visit):   Family Support: Good; Currently lives with mother.   Lifestyle Diet: Diet consists of steamed vegetables (cabbage, bell peppers, squash, zucchini and onions) and oven baked chicken mostly. Pt endorses he drinks coffee in the morning, then Gatorade, zero sugar sodas and water throughout the day.  Exercise: none, pt states he doesn't get out much.   Social History   Substance and Sexual Activity  Alcohol Use Not Currently   Comment: occassional, 2-3 per month   Social History   Tobacco Use  Smoking Status Every Day   Packs/day: 1.00   Years: 35.00    Pack years: 35.00   Types: Cigarettes  Smokeless Tobacco Former   Types: Chew   Quit date: 2007  Tobacco Comments   03/12/21 Pt was asked about thoughts of quitting. States he has thought about it but states "it is hard to quit".     Health Maintenance  Topic Date Due   COVID-19 Vaccine (1) Never done   HIV Screening  Never done   Hepatitis C Screening  Never done   TETANUS/TDAP  Never done   COLONOSCOPY (Pts 45-35yrs Insurance coverage will need to be confirmed)  Never done   Pneumococcal Vaccine 23-48 Years old (2 - PCV) 05/21/2020   INFLUENZA VACCINE  03/22/2021   HEMOGLOBIN A1C  07/23/2021   OPHTHALMOLOGY EXAM  11/20/2021   FOOT EXAM  03/16/2022   PNEUMOCOCCAL POLYSACCHARIDE VACCINE AGE 89-64 HIGH RISK  Completed   HPV VACCINES  Aged Out   Health Maintenance/Date Completed  Last ED visit: -  Last Visit to PCP: 01/28/21 Next Visit to PCP: 05/06/21 Specialist Visit: 03/16/21 - endocrinology  Dental Exam: no, and verbalizes he does not want a referral at this time.  Eye Exam: Diabetic 11/20/2020, Thad Ranger Prostate Exam: never , in the process of getting paperwork for [facility]  Colonoscopy: referral has been placed, waiting on paperwork.  Flu Vaccine: -  Pneumonia Vaccine: pt states he has never had vaccine. Chart confirms vaccine was done 05/22/2019 COVID-19 Vaccine: Pt states he does not want vaccination against COVID-19.  Shingrix Vaccine: eligible for vaccine next year.   Outpatient Encounter Medications as of 03/15/2021  Medication Sig   aspirin 81 MG EC tablet Take 1 tablet (81 mg total) by mouth daily.   COMFORT EZ PEN NEEDLES 32G X 4 MM MISC USE AS DIRECTED WITH BASAGLAR   gabapentin (NEURONTIN) 400 MG capsule TAKE 3 CAPSULES BY MOUTH 3 TIMES A DAY   glimepiride (AMARYL) 4 MG tablet TAKE ONE TABLET BY MOUTH EVERY DAY   glucose blood ([facility] GLUCOSE METER TEST STRIPS) test strip Use as instructed   hydrochlorothiazide (MICROZIDE) 12.5 MG capsule TAKE 2 CAPSULES  (25MG  TOTAL) BY MOUTH EVERY DAY.   ibuprofen (ADVIL,MOTRIN) 200 MG tablet Take 200-400 mg by mouth every 6 (six) hours as needed for fever or mild pain.    Insulin Glargine (BASAGLAR KWIKPEN) 100 UNIT/ML INJECT 25 UNITS UNDER THE SKIN 2 TIMES A DAY   Lancets (FREESTYLE) lancets Use as instructed   lisinopril (ZESTRIL) 20 MG tablet TAKE ONE TABLET BY MOUTH EVERY DAY   metFORMIN (GLUCOPHAGE) 1000 MG tablet TAKE ONE TABLET BY MOUTH 2 TIMES A DAY   methadone (DOLOPHINE) 10 MG/5ML solution Take 140 mg by mouth daily.    OZEMPIC, 0.25 OR 0.5 MG/DOSE, 2 MG/1.5ML SOPN INJECT 0.25MG  INTO THE SKIN ONCE A WEEK FOR 28 DAYS. THEN 0.5MG  ONCE A WEEK.   rosuvastatin (CRESTOR) 40 MG tablet TAKE ONE TABLET BY MOUTH EVERY DAY   [DISCONTINUED] aspirin EC 81 MG EC tablet Take 1 tablet (81 mg total) by mouth daily.   [DISCONTINUED] ezetimibe (ZETIA) 10 MG tablet Take 1 tablet (10 mg total) by mouth daily. (Patient not taking: Reported on 03/15/2021)   [DISCONTINUED] metoprolol tartrate (LOPRESSOR) 25 MG tablet Take 1 tablet (25 mg total) by mouth 2 (two) times daily.   No facility-administered encounter medications on file as of 03/15/2021.     Assessment and Plan:  Medication Management/Adherence:  Pt reports adherence to all medications. Only concern today is that aspirin be sent as a prescription so pt can obtain at pharmacy. Medication Management Clinic is taking care of all of patient's medication needs at this time.   Type II Diabetes Mellitus:  Current regimen consists of metformin, glimepiride, Basaglar. Pt is waiting to receive Ozempic from pharmacy. Denies GI upset and abdominal pain. Last A1c was 8.8% on 01/21/21. Goal A1c <7%. Next A1c due in 04/2021. Pt reports home BG readings range from 120s - 130s. States he checks blood glucose in the morning before breakfast. Pt has an appointment with endocrinology on 03/16/2021. Followed by: 03/18/2021, NP.   Hypertension: Current regimen consists of  lisinopril and HCTZ. Pt denies chest pain, vision changes, dizziness and headaches at this time. Pt reports he checks BP at home every once in a while at home which ranges from 120s -130s/80s. States as long as he takes his medications his blood pressure remains at goal. Followed by: Eulogio Bear, NP.   Hyperlipidemia:  Currently on rosuvastatin. Pt denies muscle pain. Followed by: Eulogio Bear, NP.   MI/ACS:  Currently on aspirin, ACE inhibitor and atorvastatin. Pt denies chest pain. Pt endorses he would be appreciative of prescription to avoid purchasing OTC. Referral to cardiology is awaiting pt's paperwork for [facility].   Neuropathy:  Currently on gabapentin. Pt does not report any ADEs at this time.  Followed by: Eulogio Bear, NP.   Pain:  Currently on Methadone. Spoke with Marylene Land from [facility] about pt's dose. She states pt takes 140 mg once daily for opioid addiction. Pt receives 3 take home doses per Marylene Land. According to 2020 American Society of Addiction Medicine Guidelines , "the usual daily dose of methadone ranges from 60 to 120 mg. Some patients may respond to lower doses and some may need higher doses. Methadone titration should be individualized based on careful assessment of the patient's response and generally should not be increased every day. Typically, methadone can be increased by no more than 10 mg approximately every 5 days based on the patient's symptoms of opioid withdrawal or sedation." Plan: Monitor respiratory depression and QT prolongation.   FU with clinic in 1 year.   M. [place] Aira Sallade, BS PharmD Candidate [phone] [employer]   Cosigned: Iona Beard. Joelene Millin, PharmD [facility] Clinic-Pharmacy Operations Coordinator [phone]

## 2021-03-16 ENCOUNTER — Other Ambulatory Visit: Payer: Self-pay

## 2021-03-16 ENCOUNTER — Ambulatory Visit: Payer: Medicaid Other | Admitting: Endocrinology

## 2021-03-16 ENCOUNTER — Other Ambulatory Visit: Payer: Self-pay | Admitting: Gerontology

## 2021-03-16 VITALS — BP 125/84 | HR 103 | Wt 345.8 lb

## 2021-03-16 DIAGNOSIS — G6289 Other specified polyneuropathies: Secondary | ICD-10-CM

## 2021-03-16 DIAGNOSIS — G629 Polyneuropathy, unspecified: Secondary | ICD-10-CM | POA: Insufficient documentation

## 2021-03-16 DIAGNOSIS — E1165 Type 2 diabetes mellitus with hyperglycemia: Secondary | ICD-10-CM

## 2021-03-16 DIAGNOSIS — I252 Old myocardial infarction: Secondary | ICD-10-CM

## 2021-03-16 DIAGNOSIS — E1140 Type 2 diabetes mellitus with diabetic neuropathy, unspecified: Secondary | ICD-10-CM

## 2021-03-16 MED ORDER — OZEMPIC (1 MG/DOSE) 4 MG/3ML ~~LOC~~ SOPN
1.0000 mg | PEN_INJECTOR | SUBCUTANEOUS | 12 refills | Status: AC
  Filled 2021-03-16: qty 3, 28d supply, fill #0
  Filled 2021-06-04: qty 12, 112d supply, fill #0

## 2021-03-16 MED ORDER — ASPIRIN 81 MG PO TBEC: 81.0000 mg | DELAYED_RELEASE_TABLET | Freq: Every day | ORAL | 3 refills | Status: AC

## 2021-03-16 NOTE — Progress Notes (Signed)
Follow up Diabetes/ Endocrine [facility]     Patient ID: Luis [name], male   DOB: 1971/01/07, 50 y.o.   MRN: [id number] Assessment:  Luis [name] is a 50 y.o. male who is seen in follow up for Uncontrolled type 2 diabetes mellitus with hyperglycemia (HCC) [E11.65] at the request of [place], Chioma E, NP.  Encounter Diagnoses 1. Uncontrolled type 2 diabetes mellitus with hyperglycemia (HCC)   2. Type 2 diabetes mellitus with diabetic neuropathy, without long-term current use of insulin (HCC)   3. Other polyneuropathy     Assessment  Patient is a 50 y.o. male male with a history of T2DM with neuropathy, HTN, ACS and MI, back pain and drug addiction in remission. He is currently not at goal with treatment but has been improving (HbA1c 8.8 on 06/02).  Plan:    DM management -Continue current drug regimen:  Insulin glargine (Basaglar) 25 units BID Glimepiride (Amaryl) 4 mg tab daily Metformin 1,000 mg BID -Start Ozempic ASAP 0.25 mg for 1 month then 0.5 mg for 1 month. -Schedule routine DM care in 3 months.  Patient was educated on 1) the increased satiety experienced with Ozempic and need to eat smaller portions and more slowly and drink more water, 2) the need to decrease insulin glargine by 5 units BID if BG is close to 100-110, 3) the potential for decreased bowel movement.  Toe fungal infection -Administer athlete's foot spray in between toes for 2 weeks.  Congestion -Start 12-hour nasal spray 1x/daily at night     Patient Instructions  Start Ozempic ASAP When you start, eat small meals and stop eating when you are not hungry. Cut insulin by 5 units twice a day whenever your blood sugars get real close to 100 (under 110).   Increase water intake Expect less frequent bowel movements Could try Afrin 12 hour nasal spray or the generic version at bedtime for 3-7 days Could try athlete foot spray twice a day for 2 week - get between toes   Orders Placed This  Encounter  Procedures   B12   Urine Microalbumin w/creat. ratio     Subjective:  Patient is a 50 y.o. male with a history of T2DM, neuropathy, HTN, ACS, MI, back pain, and drug addiction in remission. He presents for routine DM follow-up care.  He reports measuring his BG BID -with fasting BG around 115-120 and as high as 140, and evening BG around 160-170. He manages his DM with insulin glargine (Basaglar) 25 units BID, glimepiride (Amaryl) 4 mg tab daily, and metformin 1,000 mg BID. He denies any side effects, missed doses, and financial barriers. He was recently approved for Ozempic and is expected to start within the next couple of weeks.  He denies symptoms of hyperglycemia and hypoglycemia but reports tingling and numbness in his feet. He reports taking gabapentin 400 mg capsule, 3 each time PRN. He reports a recent eye exam in May and foot exam at his last [facility] visit.  He endorses "allergies" which manifest as congestion but denies history of allergies.   He reports taking steps to improve his diet, limiting his daily food intake to 3 meals, decreasing soda and added sugar, and avoiding food after 6 PM. He denies any exercise due to recent heat waves.        Review of Systems  HENT:  Positive for congestion.    Luis [name]  has a past medical history of ACS (acute coronary syndrome) (HCC) (05/20/2019), Back  pain, Diabetes mellitus without complication (HCC), Drug addiction in remission (HCC), High cholesterol, Hypertension, Knee pain, Myocardial infarction (HCC) (05/2019), and Neuropathy.  Family History, Social History, current Medications and allergies reviewed and updated in Epic.  Objective:    Blood pressure 125/84, pulse (!) 103, weight (!) 345 lb 12.8 oz (156.9 kg). Physical Exam Constitutional:      Appearance: Normal appearance. He is obese.  HENT:     Nose: Congestion present.  Cardiovascular:     Rate and Rhythm: Normal rate and regular rhythm.     Heart  sounds: Normal heart sounds.  Pulmonary:     Breath sounds: No wheezing, rhonchi or rales.  Musculoskeletal:     Right lower leg: Edema present.     Left lower leg: Edema present.  Neurological:     Mental Status: He is alert.  Psychiatric:        Mood and Affect: Mood normal.        Behavior: Behavior normal.        Data : I have personally reviewed pertinent labs and imaging studies, if indicated,  with the patient in clinic today.    Lab Orders  B12  Urine Microalbumin w/creat. ratio   HC Readings from Last 3 Encounters:  No data found for [facility]    Wt Readings from Last 3 Encounters:  03/16/21 (!) 345 lb 12.8 oz (156.9 kg)  01/28/21 (!) 346 lb 8 oz (157.2 kg)  11/05/20 (!) 358 lb 4.8 oz (162.5 kg)

## 2021-03-16 NOTE — Patient Instructions (Signed)
Start Ozempic ASAP When you start, eat small meals and stop eating when you are not hungry. Cut insulin by 5 units twice a day whenever your blood sugars get real close to 100 (under 110).   Increase water intake Expect less frequent bowel movements Could try Afrin 12 hour nasal spray or the generic version at bedtime for 3-7 days Could try athlete foot spray twice a day for 2 week - get between toes

## 2021-03-17 ENCOUNTER — Other Ambulatory Visit: Payer: Self-pay

## 2021-03-17 ENCOUNTER — Other Ambulatory Visit: Payer: Medicaid Other

## 2021-03-18 ENCOUNTER — Other Ambulatory Visit: Payer: Self-pay | Admitting: Emergency Medicine

## 2021-03-18 ENCOUNTER — Other Ambulatory Visit: Payer: Medicaid Other

## 2021-03-18 ENCOUNTER — Other Ambulatory Visit: Payer: Self-pay

## 2021-03-18 DIAGNOSIS — E1165 Type 2 diabetes mellitus with hyperglycemia: Secondary | ICD-10-CM

## 2021-03-18 DIAGNOSIS — E1140 Type 2 diabetes mellitus with diabetic neuropathy, unspecified: Secondary | ICD-10-CM

## 2021-03-18 NOTE — Progress Notes (Signed)
Placed order for CMP per [facility] from 03/16/21 OV

## 2021-03-20 LAB — COMPREHENSIVE METABOLIC PANEL
ALT: 12 IU/L (ref 0–44)
AST: 12 IU/L (ref 0–40)
Albumin/Globulin Ratio: 1.7 (ref 1.2–2.2)
Albumin: 4.5 g/dL (ref 4.0–5.0)
Alkaline Phosphatase: 88 IU/L (ref 44–121)
BUN/Creatinine Ratio: 15 (ref 9–20)
BUN: 15 mg/dL (ref 6–24)
Bilirubin Total: <0.2 mg/dL (ref 0.0–1.2)
CO2: 28 mmol/L (ref 20–29)
Calcium: 9.8 mg/dL (ref 8.7–10.2)
Chloride: 93 mmol/L — ABNORMAL LOW (ref 96–106)
Creatinine, Ser: 0.99 mg/dL (ref 0.76–1.27)
Globulin, Total: 2.6 g/dL (ref 1.5–4.5)
Glucose: 271 mg/dL — ABNORMAL HIGH (ref 65–99)
Potassium: 4.6 mmol/L (ref 3.5–5.2)
Sodium: 137 mmol/L (ref 134–144)
Total Protein: 7.1 g/dL (ref 6.0–8.5)
eGFR: 93 mL/min/{1.73_m2} (ref >59)

## 2021-03-20 LAB — MICROALBUMIN / CREATININE URINE RATIO
Creatinine, Urine: 106.1 mg/dL
Microalb/Creat Ratio: 5 mg/g creat (ref 0–29)
Microalbumin, Urine: 5.5 ug/mL

## 2021-03-20 LAB — VITAMIN B12: Vitamin B-12: 578 pg/mL (ref 232–1245)

## 2021-03-22 ENCOUNTER — Telehealth: Payer: Self-pay | Admitting: Pharmacist

## 2021-03-22 NOTE — Telephone Encounter (Signed)
03/22/2021 2:10:25 PM - Ozempic 1mg  forms HOLDING  -- - Monday, March 22, 2021 2:08 PM --I have received a pharmacy printout for dose increase on Ozempic 1mg  inject once weekly # 4 boxes  ((holding due to directions to start after 1 month of 0.25mg  and 1 month of 0.5mg --should start 05/22/2021)) We have received approval for 0.25 & 0.5mg  but not received shipment of meds yet.

## 2021-03-26 ENCOUNTER — Other Ambulatory Visit: Payer: Self-pay | Admitting: Gerontology

## 2021-03-26 ENCOUNTER — Other Ambulatory Visit: Payer: Self-pay

## 2021-03-26 DIAGNOSIS — I10 Essential (primary) hypertension: Secondary | ICD-10-CM

## 2021-03-26 MED ORDER — HYDROCHLOROTHIAZIDE 12.5 MG PO CAPS
ORAL_CAPSULE | Freq: Every day | ORAL | 1 refills | Status: AC
  Filled 2021-03-26: qty 60, 30d supply, fill #0

## 2021-03-26 MED FILL — Lisinopril Tab 20 MG: ORAL | 30 days supply | Qty: 30 | Fill #1 | Status: AC

## 2021-03-26 MED FILL — Insulin Glargine Soln Pen-Injector 100 Unit/ML: SUBCUTANEOUS | 30 days supply | Qty: 15 | Fill #1 | Status: AC

## 2021-03-26 MED FILL — Glimepiride Tab 4 MG: ORAL | 30 days supply | Qty: 30 | Fill #1 | Status: AC

## 2021-03-26 MED FILL — Insulin Pen Needle 32 G X 4 MM (1/6" or 5/32"): 50 days supply | Qty: 100 | Fill #1 | Status: AC

## 2021-03-29 ENCOUNTER — Other Ambulatory Visit: Payer: Self-pay

## 2021-04-06 ENCOUNTER — Other Ambulatory Visit: Payer: Self-pay

## 2021-04-06 NOTE — Progress Notes (Signed)
I, Khoen Genet B Aaria Happ, MD, have reviewed all documentation for this visit. The documentation on 04/06/21 for the exam, diagnosis, procedures, and orders are all accurate and complete. 

## 2021-04-07 ENCOUNTER — Other Ambulatory Visit: Payer: Self-pay | Admitting: Gerontology

## 2021-04-07 ENCOUNTER — Other Ambulatory Visit: Payer: Self-pay

## 2021-04-07 DIAGNOSIS — IMO0002 Reserved for concepts with insufficient information to code with codable children: Secondary | ICD-10-CM

## 2021-04-07 DIAGNOSIS — E1142 Type 2 diabetes mellitus with diabetic polyneuropathy: Secondary | ICD-10-CM

## 2021-04-07 MED ORDER — GABAPENTIN 400 MG PO CAPS
ORAL_CAPSULE | ORAL | 1 refills | Status: AC
  Filled 2021-04-07: qty 270, fill #0
  Filled 2021-04-09: qty 270, 30d supply, fill #0
  Filled 2021-05-10: qty 270, 30d supply, fill #1

## 2021-04-08 ENCOUNTER — Other Ambulatory Visit: Payer: Self-pay

## 2021-04-09 ENCOUNTER — Other Ambulatory Visit: Payer: Self-pay

## 2021-04-15 ENCOUNTER — Telehealth: Payer: Self-pay | Admitting: Pharmacist

## 2021-04-15 NOTE — Telephone Encounter (Signed)
04/15/2021 12:44:27 PM - Ozempic 1mg  Dose ^ to Novo  -- - Thursday, April 15, 2021 12:43 PM --April 17, 2021 dose increase for Ozempic 1mg  once weekly.

## 2021-04-21 ENCOUNTER — Other Ambulatory Visit: Payer: Self-pay

## 2021-04-28 ENCOUNTER — Other Ambulatory Visit: Payer: Self-pay

## 2021-04-28 ENCOUNTER — Other Ambulatory Visit: Payer: Medicaid Other

## 2021-04-28 DIAGNOSIS — E1165 Type 2 diabetes mellitus with hyperglycemia: Secondary | ICD-10-CM

## 2021-04-28 DIAGNOSIS — IMO0002 Reserved for concepts with insufficient information to code with codable children: Secondary | ICD-10-CM

## 2021-04-29 LAB — HEMOGLOBIN A1C
Est. average glucose Bld gHb Est-mCnc: 223 mg/dL
Hgb A1c MFr Bld: 9.4 % — ABNORMAL HIGH (ref 4.8–5.6)

## 2021-05-06 ENCOUNTER — Other Ambulatory Visit: Payer: Self-pay

## 2021-05-06 ENCOUNTER — Ambulatory Visit: Payer: Medicaid Other | Admitting: Gerontology

## 2021-05-06 ENCOUNTER — Encounter: Payer: Self-pay | Admitting: Gerontology

## 2021-05-06 VITALS — BP 129/82 | HR 95 | Temp 97.4°F | Resp 18 | Ht 67.0 in | Wt 342.5 lb

## 2021-05-06 DIAGNOSIS — E1165 Type 2 diabetes mellitus with hyperglycemia: Secondary | ICD-10-CM

## 2021-05-06 DIAGNOSIS — Z6841 Body Mass Index (BMI) 40.0 and over, adult: Secondary | ICD-10-CM

## 2021-05-06 DIAGNOSIS — J302 Other seasonal allergic rhinitis: Secondary | ICD-10-CM

## 2021-05-06 DIAGNOSIS — R600 Localized edema: Secondary | ICD-10-CM

## 2021-05-06 MED ORDER — FLUTICASONE PROPIONATE 50 MCG/ACT NA SUSP
1.0000 | Freq: Every day | NASAL | 1 refills | Status: AC | PRN
  Filled 2021-05-06: qty 16, 60d supply, fill #0

## 2021-05-06 NOTE — Patient Instructions (Addendum)
Edema Edema is when you have too much fluid in your body or under your skin. Edema may make your legs, feet, and ankles swell up. Swelling is also common in looser tissues, like around your eyes. This is a common condition. It gets more common as you get older. There are many possible causes of edema. Eating too much salt (sodium) and being on your feet or sitting for a long time can cause edema in your legs, feet, and ankles. Hot weather may make edema worse. Edema is usually painless. Your skin may look swollen or shiny. Follow these instructions at home: Keep the swollen body part raised (elevated) above the level of your heart when you are sitting or lying down. Do not sit still or stand for a long time. Do not wear tight clothes. Do not wear garters on your upper legs. Exercise your legs. This can help the swelling go down. Wear elastic bandages or support stockings as told by your doctor. Eat a low-salt (low-sodium) diet to reduce fluid as told by your doctor. Depending on the cause of your swelling, you may need to limit how much fluid you drink (fluid restriction). Take over-the-counter and prescription medicines only as told by your doctor. Contact a doctor if: Treatment is not working. You have heart, liver, or kidney disease and have symptoms of edema. You have sudden and unexplained weight gain. Get help right away if: You have shortness of breath or chest pain. You cannot breathe when you lie down. You have pain, redness, or warmth in the swollen areas. You have heart, liver, or kidney disease and get edema all of a sudden. You have a fever and your symptoms get worse all of a sudden. Summary Edema is when you have too much fluid in your body or under your skin. Edema may make your legs, feet, and ankles swell up. Swelling is also common in looser tissues, like around your eyes. Raise (elevate) the swollen body part above the level of your heart when you are sitting or lying  down. Follow your doctor's instructions about diet and how much fluid you can drink (fluid restriction). This information is not intended to replace advice given to you by your health care provider. Make sure you discuss any questions you have with your health care provider. Document Revised: 06/03/2020 Document Reviewed: 06/03/2020 Elsevier Patient Education  2022 Elsevier Inc. Living With Diabetes Diabetes (type 1 diabetes mellitus or type 2 diabetes mellitus) is a condition in which the body does not have enough of a hormone called insulin, or the body does not respond properly to insulin. Normally, insulin allows sugars (glucose) to enter cells in the body. With diabetes, extra glucose builds up in the blood instead of going into cells. This results in high blood glucose (hyperglycemia). How to manage lifestyle changes Managing diabetes includes medical treatments as well as lifestyle changes. If diabetes is not managed well, serious physical and emotional complications can occur. Taking good care of yourself means that you are responsible for: Monitoring glucose regularly. Eating a healthy diet. Exercising regularly. Meeting with health care providers. Taking medicines as directed. Most people feel some stress about managing their diabetes. When this stress becomes too much, it is known as diabetes-related distress. This is very common. Living with diabetes can place you at risk for diabetes distress, depression, or anxiety. These disorders can make diabetes more difficult to manage. How to recognize stress You may have diabetes distress if you: Avoid or ignore your daily  diabetes care. This includes glucose testing, following a meal plan, and taking medications. Feel overwhelmed by your daily diabetes care. Experience emotional reactions such as anger, sadness, or fear related to your daily diabetes care. Feel fear or shame about not doing everything perfectly that you have been told to  do. Emotional distress Symptoms of diabetes distress include: Anger about having a diagnosis of diabetes. Fear or frustration about your diagnosis and the changes you need to make to manage the condition. Being overly worried about the care that you need or the cost of the care that you need. Feeling like you caused your condition by doing something wrong. Fear about unpredictable fluctuations in your blood glucose, like low or high blood glucose. Feeling judged by your health care providers. Feeling very alone with the disease. Depression Having diabetes means that you are at a higher risk for depression. Your health care provider may test (screen) you for symptoms of depression. It is important to recognize symptoms and to start treatment for depression soon after it is diagnosed. The following are some symptoms of depression: Loss of interest in things that you used to enjoy. Feeling depressed much or most of the time. A change in appetite. Trouble getting to sleep or staying asleep. Feeling tired most of the day. Feeling nervous and anxious. Feeling guilty and worrying that you are a burden to others. Having thoughts of hurting yourself or feeling that you want to die. If you have any of these symptoms, more days than not, for 2 weeks or longer, you may have depression. This would be a good time to contact your health care provider. Follow these instructions at home: Managing diabetes distress The following are some ways to manage emotional distress: Learn as much as you can about diabetes and its treatment. Take one step at a time to improve your management. Meet with a certified diabetes care and education specialist. Take a class to learn how to manage your condition. Consider working with a counselor or therapist. Keep a journal of your thoughts and concerns. Accept that some things are out of your control. Talk with other people who have diabetes. It can help to talk about the  distress that you feel. Find ways to manage stress that work for you. These may include art or music therapy, exercise, meditation, and hobbies. Seek support from spiritual leaders, family, and friends.  General instructions Do your best to follow your diabetes management plan. If you are struggling to follow your plan, talk with a certified diabetes care and education specialist, or with someone else who has diabetes. They may have ideas that will help. Forgive yourself for not being perfect. Almost everyone struggles with the tasks of diabetes. Keep all follow-up visits. This is important. Where to find support Search for information and support from the American Diabetes Association: www.diabetes.org Find a certified diabetes education and care specialist. Make an appointment through the Association of Diabetes Care & Education Specialists: www.diabeteseducator.org Contact a health care provider if: You believe your diabetes is getting out of control. You are concerned you may be depressed. You think your medications are not helping control your diabetes. You are feeling overwhelmed with your diabetes. Get help right away if: You have thoughts about hurting yourself or others. If you ever feel like you may hurt yourself or others, or have thoughts about taking your own life, get help right away. You can go to your nearest emergency department or call: Your local emergency services (911 in  the [place] A suicide crisis helpline, such as the National Suicide Prevention Lifeline at 709-665-1292. This is open 24 hours a day. Summary Diabetes (type 1 diabetes mellitus or type 2 diabetes mellitus) is a condition in which the body does not have enough of a hormone called insulin, or the body does not respond properly to insulin. Living with diabetes puts you at risk for medical and emotional issues, such as diabetes distress, depression, and anxiety. Recognizing the symptoms of diabetes distress  and depression may help you avoid problems with your diabetes control. If you experience symptoms, it is important to discuss this with your health care provider, certified diabetes care and education specialist, or therapist. It is important to start treatment for diabetes distress and depression soon after diagnosis. Ask your health care provider to recommend a therapist who understands both depression and diabetes. This information is not intended to replace advice given to you by your health care provider. Make sure you discuss any questions you have with your health care provider. Document Revised: 12/19/2019 Document Reviewed: 12/19/2019 Elsevier Patient Education  2022 [employer].

## 2021-05-06 NOTE — Progress Notes (Signed)
Established Patient Office Visit  Subjective:  Patient ID: Luis [name], male    DOB: 1970/10/20  Age: 50 y.o. MRN: [id number]  CC:  Chief Complaint  Patient presents with   Follow-up    Lab drawn on 04/28/21   Diabetes    Blood sugar not checked this AM; usually 120-130 in the AM fasting    HPI Luis [name] is a 50 year old male who has a history T2DM, MI, HTN, Hyperlipidemia who presents for follow up of T2DM and review of labs drawn 04/28/21. His A1C has increased from 8.8 to 9.4. He was started on Ozempic at his 01/28/21 visit and is titrating his dose as instructed by Endocrinology. He will increase his dose 05/12/21 to 0.5 mg once a week. POCT in clinic this morning is 214 mg/dl. He reports not eating breakfast, but 1 cup of coffee with splenda and cream, and states he did have cake last night. He reports his normal blood sugar readings at home are in the 120-130/140's, but no hypoglycemic events. Today he is concerned about his new weight gain. He was 334 pounds last week, is 342 pounds today. Lastly, he reports trying several medications over the counter for seasonal allergies with little benefit and would like to try something different. Mr. [name] has no additional concerns and feels well overall.  Past Medical History:  Diagnosis Date   ACS (acute coronary syndrome) ([place]) 05/20/2019   Back pain    Diabetes mellitus without complication ([place])    Drug addiction in remission ([place])    High cholesterol    Hypertension    Knee pain    Myocardial infarction ([place]) 05/2019   Neuropathy     Past Surgical History:  Procedure Laterality Date   HERNIA REPAIR     KNEE SURGERY Right    LEFT HEART CATH AND CORONARY ANGIOGRAPHY N/A 05/21/2019   Procedure: LEFT HEART CATH AND CORONARY ANGIOGRAPHY;  Surgeon: Isaias Cowman, MD;  Location: [facility];  Service: Cardiovascular;  Laterality: N/A;    Family History  Problem Relation Age of Onset   Cancer Mother         stomach, currently in remission reported on 03/15/21   Hyperlipidemia Father    Hypertension Father    Heart disease Father    Heart disease Brother        stent x 1   Heart attack Brother    Heart disease Paternal Grandfather    Diabetes Paternal Grandfather     Social History   Socioeconomic History   Marital status: Divorced    Spouse name: Not on file   Number of children: 2   Years of education: Not on file   Highest education level: High school graduate  Occupational History   Occupation: unemployed  Tobacco Use   Smoking status: Every Day    Packs/day: 1.00    Years: 35.00    Pack years: 35.00    Types: Cigarettes   Smokeless tobacco: Former    Types: Chew    Quit date: 2007   Tobacco comments:    03/12/21 Pt was asked about thoughts of quitting. States he has thought about it but states "it is hard to quit".   Vaping Use   Vaping Use: Never used  Substance and Sexual Activity   Alcohol use: Yes    Alcohol/week: 2.0 standard drinks    Types: 2 Cans of beer per week    Comment: occassional, 2-3 per week  Drug use: Not Currently    Types: Marijuana, Methamphetamines    Comment: quit 2000   Sexual activity: Not Currently    Birth control/protection: Condom  Other Topics Concern   Not on file  Social History Narrative   On food stamps, which helps with food. Living with mom as has been unemployed due to nueropathy.   Social Determinants of Health   Financial Resource Strain: Not on file  Food Insecurity: No Food Insecurity   Worried About Charity fundraiser in the Last Year: Never true   Ran Out of Food in the Last Year: Never true  Transportation Needs: No Transportation Needs   Lack of Transportation (Medical): No   Lack of Transportation (Non-Medical): No  Physical Activity: Not on file  Stress: Not on file  Social Connections: Not on file  Intimate Partner Violence: Not on file    Outpatient Medications Prior to Visit  Medication Sig Dispense  Refill   aspirin 81 MG EC tablet Take 1 tablet (81 mg total) by mouth daily. 30 tablet 3   COMFORT EZ PEN NEEDLES 32G X 4 MM MISC USE AS DIRECTED WITH BASAGLAR 50 each 11   gabapentin (NEURONTIN) 400 MG capsule TAKE 3 CAPSULES BY MOUTH 3 TIMES A DAY 270 capsule 1   glimepiride (AMARYL) 4 MG tablet TAKE ONE TABLET BY MOUTH EVERY DAY 30 tablet 2   glucose blood ([employer] GLUCOSE METER TEST STRIPS) test strip Use as instructed 100 each 12   hydrochlorothiazide (MICROZIDE) 12.5 MG capsule TAKE 2 CAPSULES (25MG TOTAL) BY MOUTH ONCE DAILY. 60 capsule 1   ibuprofen (ADVIL,MOTRIN) 200 MG tablet Take 200-400 mg by mouth every 6 (six) hours as needed for fever or mild pain.      Insulin Glargine (BASAGLAR KWIKPEN) 100 UNIT/ML INJECT 25 UNITS UNDER THE SKIN 2 TIMES A DAY 15 mL 5   Lancets (FREESTYLE) lancets Use as instructed 100 each 12   lisinopril (ZESTRIL) 20 MG tablet TAKE ONE TABLET BY MOUTH EVERY DAY 90 tablet 1   metFORMIN (GLUCOPHAGE) 1000 MG tablet TAKE ONE TABLET BY MOUTH 2 TIMES A DAY 180 tablet 0   methadone (DOLOPHINE) 10 MG/5ML solution Take 140 mg by mouth daily.      OZEMPIC, 0.25 OR 0.5 MG/DOSE, 2 MG/1.5ML SOPN INJECT 0.25MG INTO THE SKIN ONCE A WEEK FOR 28 DAYS. THEN 0.5MG ONCE A WEEK. 6 mL 1   rosuvastatin (CRESTOR) 40 MG tablet TAKE ONE TABLET BY MOUTH EVERY DAY 30 tablet 2   Semaglutide, 1 MG/DOSE, (OZEMPIC, 1 MG/DOSE,) 4 MG/3ML SOPN Inject 1 mg into the skin once a week. To start after 1 month of treatment at 0.25 and 1 month at 0.5 (should begin ~05/22/2021) (Patient not taking: No sig reported) 3 mL 12   No facility-administered medications prior to visit.    No Known Allergies  ROS Review of Systems  Constitutional:  Positive for unexpected weight change. Negative for activity change and appetite change.  HENT:  Positive for postnasal drip. Negative for rhinorrhea and sinus pain.   Eyes:  Negative for pain and visual disturbance.  Respiratory:  Negative for cough and shortness of  breath.   Cardiovascular:  Positive for leg swelling. Negative for chest pain.  Gastrointestinal: Negative.  Negative for abdominal pain, nausea and vomiting.  Endocrine: Negative.   Genitourinary:  Negative for difficulty urinating, dysuria and frequency.  Skin:  Negative for rash and wound.  Allergic/Immunologic: Positive for environmental allergies.  Neurological:  Negative for  dizziness and light-headedness.       Pins and needles in his hands, feet  Hematological: Negative.   Psychiatric/Behavioral:  Negative for dysphoric mood and suicidal ideas.      Objective:    Physical Exam Vitals and nursing note reviewed.  Constitutional:      Appearance: He is obese.  HENT:     Head: Normocephalic and atraumatic.     Nose: Nose normal.     Mouth/Throat:     Mouth: Mucous membranes are moist.     Pharynx: Oropharyngeal exudate (cobblestone) present. No posterior oropharyngeal erythema.  Eyes:     Pupils: Pupils are equal, round, and reactive to light.  Cardiovascular:     Rate and Rhythm: Normal rate and regular rhythm.     Pulses: Normal pulses.     Heart sounds: Normal heart sounds.  Pulmonary:     Effort: Pulmonary effort is normal.     Breath sounds: Normal breath sounds.  Abdominal:     General: Bowel sounds are normal.     Palpations: Abdomen is soft.  Genitourinary:    Comments: deferred Musculoskeletal:        General: Normal range of motion.     Cervical back: Normal range of motion.     Right lower leg: Edema (trace) present.     Left lower leg: Edema (trace) present.  Skin:    General: Skin is warm and dry.     Capillary Refill: Capillary refill takes less than 2 seconds.  Neurological:     General: No focal deficit present.     Mental Status: He is alert and oriented to person, place, and time.  Psychiatric:        Mood and Affect: Mood normal.        Behavior: Behavior normal.    BP 129/82 (BP Location: Left Arm, Patient Position: Sitting, Cuff Size:  Large)   Pulse 95   Temp (!) 97.4 F (36.3 C)   Resp 18   Ht $R'5\' 7"'LQ$  (1.702 m)   Wt (!) 342 lb 8 oz (155.4 kg)   SpO2 92%   BMI 53.64 kg/m  Wt Readings from Last 3 Encounters:  05/06/21 (!) 342 lb 8 oz (155.4 kg)  04/28/21 (!) 334 lb (151.5 kg)  03/16/21 (!) 345 lb 12.8 oz (156.9 kg)   Encouraged weight loss  Health Maintenance Due  Topic Date Due   COVID-19 Vaccine (1) Never done   HIV Screening  Never done   Hepatitis C Screening  Never done   TETANUS/TDAP  Never done   COLONOSCOPY (Pts 45-18yrs Insurance coverage will need to be confirmed)  Never done   Pneumococcal Vaccine 86-17 Years old (2 - PCV) 05/21/2020   INFLUENZA VACCINE  03/22/2021    There are no preventive care reminders to display for this patient.  Lab Results  Component Value Date   TSH 2.920 03/26/2020   Lab Results  Component Value Date   WBC 12.5 (H) 10/23/2019   HGB 15.2 10/23/2019   HCT 45.4 10/23/2019   MCV 84 10/23/2019   PLT 311 08/01/2019   Lab Results  Component Value Date   NA 137 03/18/2021   K 4.6 03/18/2021   CO2 28 03/18/2021   GLUCOSE 271 (H) 03/18/2021   BUN 15 03/18/2021   CREATININE 0.99 03/18/2021   BILITOT <0.2 03/18/2021   ALKPHOS 88 03/18/2021   AST 12 03/18/2021   ALT 12 03/18/2021   PROT 7.1 03/18/2021   ALBUMIN  4.5 03/18/2021   CALCIUM 9.8 03/18/2021   ANIONGAP 12 05/22/2019   EGFR 93 03/18/2021   Lab Results  Component Value Date   CHOL 181 10/22/2020   Lab Results  Component Value Date   HDL 28 (L) 10/22/2020   Lab Results  Component Value Date   LDLCALC 120 (H) 10/22/2020   Lab Results  Component Value Date   TRIG 184 (H) 10/22/2020   Lab Results  Component Value Date   CHOLHDL 6.5 (H) 10/22/2020   Lab Results  Component Value Date   HGBA1C 9.4 (H) 04/28/2021      Assessment & Plan:   1. Uncontrolled type 2 diabetes mellitus with hyperglycemia ([place]) - encouraged to eat low carbohydrate diet, watch snacks; increase daily exercise as  tolerated. He was encouraged to check his blood glucose bid, record and bring glucometer to follow up appointment.  - POCT Glucose (CBG); Future  2. Class 3 severe obesity due to excess calories with serious comorbidity and body mass index (BMI) of 50.0 to 59.9 in adult [facility]) - encouraged to exercise and watch his diet and portions. Will recheck weight in 1 month.  3. Bilateral lower extremity edema - encouraged low salt diet, daily exercise, and continued adherence to his hctz. Elevate leg while sitting down.  4. Seasonal allergies - can use humidifier if experiencing congestion. - fluticasone (FLONASE) 50 MCG/ACT nasal spray; Place 1 spray into both nostrils daily as needed for allergies or rhinitis.  Dispense: 16 g; Refill: 1    Follow-up: Return in about 27 days (around 06/02/2021), or if symptoms worsen or fail to improve.    Harvin Hazel, RN

## 2021-05-07 ENCOUNTER — Other Ambulatory Visit: Payer: Self-pay

## 2021-05-10 ENCOUNTER — Other Ambulatory Visit: Payer: Self-pay

## 2021-05-10 MED FILL — Lisinopril Tab 20 MG: ORAL | 30 days supply | Qty: 30 | Fill #2 | Status: AC

## 2021-05-13 ENCOUNTER — Other Ambulatory Visit: Payer: Self-pay

## 2021-05-13 MED FILL — Insulin Glargine Soln Pen-Injector 100 Unit/ML: SUBCUTANEOUS | 30 days supply | Qty: 15 | Fill #2 | Status: AC

## 2021-05-18 ENCOUNTER — Other Ambulatory Visit: Payer: Self-pay

## 2021-05-18 MED ORDER — AMOXICILLIN-POT CLAVULANATE 875-125 MG PO TABS
ORAL_TABLET | ORAL | 0 refills | Status: AC
  Filled 2021-05-18: qty 20, 10d supply, fill #0

## 2021-05-18 MED ORDER — PREDNISONE 20 MG PO TABS
ORAL_TABLET | ORAL | 0 refills | Status: AC
  Filled 2021-05-18: qty 5, 5d supply, fill #0

## 2021-05-27 ENCOUNTER — Other Ambulatory Visit: Payer: Self-pay

## 2021-06-04 ENCOUNTER — Other Ambulatory Visit: Payer: Self-pay

## 2021-06-08 ENCOUNTER — Ambulatory Visit: Payer: Medicaid Other

## 2021-06-10 ENCOUNTER — Ambulatory Visit: Payer: Medicaid Other | Admitting: Gerontology

## 2021-07-30 ENCOUNTER — Other Ambulatory Visit: Payer: Self-pay

## 2021-08-17 ENCOUNTER — Other Ambulatory Visit: Payer: Self-pay

## 2021-08-26 ENCOUNTER — Other Ambulatory Visit: Payer: Self-pay

## 2021-09-15 ENCOUNTER — Other Ambulatory Visit: Payer: Self-pay

## 2021-10-08 ENCOUNTER — Telehealth: Payer: Self-pay | Admitting: Pharmacist

## 2021-10-08 NOTE — Telephone Encounter (Signed)
Patient has prescription drug coverage. No longer meets the eligibility requirements to obtain medication assistance from [facility]. Patient notified by letter. °Debra Cheek °Administrative Assistant °[facility] °

## 2021-10-11 ENCOUNTER — Other Ambulatory Visit: Payer: Self-pay

## 2021-12-06 ENCOUNTER — Encounter: Payer: Self-pay | Admitting: Occupational Therapy

## 2021-12-06 ENCOUNTER — Ambulatory Visit: Payer: Medicaid Other | Attending: Gerontology | Admitting: Occupational Therapy

## 2021-12-06 DIAGNOSIS — I890 Lymphedema, not elsewhere classified: Secondary | ICD-10-CM | POA: Diagnosis present

## 2021-12-07 NOTE — Patient Instructions (Signed)
Be mindful of head -forward posture! Practice straightening you posture by tipping your pelvis forward a little. ? ?

## 2021-12-08 NOTE — Therapy (Signed)
[place] ?[facility] ?[address] ?[place], [place], [address] ?Phone: [phone]   Fax:  [phone] ? ?Occupational Therapy Evaluation ? ?Patient Details  ?Name: Luis [name] ?MRN: [id number] ?Date of Birth: [birth year] ?Referring Provider (OT): Shetal Lanier Clam, MD ? ? ?Encounter Date: 12/06/2021 ? ? OT End of Session - 12/08/21 1248   ? ? Visit Number 1  ? Number of Visits 36   ? Date for OT Re-Evaluation 03/06/22   ? OT Start Time 1100   ? OT Stop Time 1205   ? OT Time Calculation (min) 65 min   ? Activity Tolerance Patient tolerated treatment well;No increased pain   ? Behavior During Therapy [facility] for tasks assessed/performed   ? ?  ?  ? ?  ? ? ?Past Medical History:  ?Diagnosis Date  ? ACS (acute coronary syndrome) ([facility]) 05/20/2019  ? Back pain   ? Diabetes mellitus without complication ([facility])   ? Drug addiction in remission [facility])   ? High cholesterol   ? Hypertension   ? Knee pain   ? Myocardial infarction ([facility]) 05/2019  ? Neuropathy   ? ? ?Past Surgical History:  ?Procedure Laterality Date  ? HERNIA REPAIR    ? KNEE SURGERY Right   ? LEFT HEART CATH AND CORONARY ANGIOGRAPHY N/A 05/21/2019  ? Procedure: LEFT HEART CATH AND CORONARY ANGIOGRAPHY;  Surgeon: Marcina Millard, MD;  Location: [facility];  Service: Cardiovascular;  Laterality: N/A;  ? ? ?There were no vitals filed for this visit. ? ? Subjective Assessment - 12/08/21 1234   ? ? 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 Luis [name] is accompanied by his mother today who assists him with communication as needed. Luis [name] reports onset of neck and facial swelling and  pain immediately following total laryngectomy on 06/01/2021. Luis [name] reports swelling is "constant". He facial pain and sensory  symptoms at 8/10. His gola for Occupational Therapy is to reduce swelling in his neck and decrease associated pain.  ? Patient is accompanied by: Family member   Mother  ? 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 S/P total laryngectomy w B LND ( 6+/88). HTN, DM, CAD, obesity, Hx MI 2021, periferal neuropathy, chronic face and neck swelling and pain, neuropathy, OSA, drug addiction (remission-on Methadone maintenenance therapy), Hx tobacco abuse.   ? Limitations unable to speak ( no voice prosthesis yet), difficulty swallowing, difficulty eating (teeth extracted, no dentures yet) chronic head/neck swelling and associated pain,  decreased cervical AROM, decreased L shoulder extension 4/5, impaired body image, mood swings, increased palpable tissue fibrosis  ? Special Tests Face and Neck measurements TBA Rx visit 2; Intake FOTO score 98/100   ? Patient Stated Goals reduce head/neck/facial swelling  and pain, and leep it from getting worse   ? Currently in Pain? Yes   ? Pain Location Neck   ? Pain Descriptors / Indicators Heaviness;Shooting;Tightness;Pressure;Pins and needles;Numbness   ? Pain Type Chronic pain   ? Pain Radiating Towards L ear to corner of mouth   ? Pain Onset Other (comment)   s/p surgery  ? Aggravating Factors  constant   ? Pain Relieving Factors constant   ? Effect of Pain on Daily Activities bilateral neck and  facial swelling limits functional performance in all occupational domains, including basic and instrumental ADLs, work and productive activities, leisure pursuits, social participation, body image and quality of life   ? ?  ?  ? ?  ? ? ? ? [facility] OT Assessment - 12/08/21 1237   ? ?  ? Assessment  ? Medical Diagnosis Mild, stage II, bilateral  head and neck lymphedema   "1a -  Visible mild swelling without pitting " ( MD [facility] and Neck Lymphedema Rating Scale)  ? Referring Provider (OT)  Mathis Bud Lanier Clam, MD   ? Onset Date/Surgical Date 06/01/21   ? Hand Dominance Right   ? Prior Therapy no CDT, no compression   ?  ? Precautions  ? Precautions Other (comment)   LE precautions: cardiac, DM  ? Precaution Comments no XRT   ?  ? Balance Screen  ? Has the patient fallen in the past 6 months No   ? Has the patient had a decrease in activity level because of a fear of falling?  Yes   ? Is the patient reluctant to leave their home because of a fear of falling?  No   ?  ? Home  Environment  ? Lives With Family;Other (Comment)   Mother  ?  ? Prior Function  ? Level of Independence Independent   ? Vocation Unemployed   ? Vocation Requirements sedentary   ? Leisure watching TV, going out to eat, fishing   ?  ? ADL  ? Eating/Feeding Modified independent   extra time, no dentures  ? Grooming Modified independent   ? Grooming Patient Percentage --   difficulty shaving face  ? Upper Body Bathing Independent   ? Lower Body Bathing Independent   ? Upper Body Dressing Increased time;Other (comment)   covers stoma with closing to disguise  ? Lower Body Dressing Independent   ? Toilet Transfer Independent   ? Toileting - Clothing Manipulation Independent   ?  ? IADL  ? Prior Level of Function Shopping I   ? Shopping Shops independently for [employer]   ? Prior Level of Function Light Housekeeping I   ? Light Housekeeping Performs light daily tasks but cannot maintain acceptable level of cleanliness   ? Prior Level of Function Meal Prep I   ? Meal Prep Able to complete simple cold meal and snack prep   ? Prior Level of Function Community Mobility I   ? Community Mobility Drives own vehicle   ?  ? Mobility  ? Mobility Status Independent   ? Mobility Status Comments no devices   ?  ? Activity Tolerance  ? Activity Tolerance Tolerate 30+ min activity without fatigue   ?  ? Cognition  ? Overall Cognitive Status Within Functional Limits for tasks assessed   ?  ? Observation/Other Assessments  ? Outcome Measures Intake  FOTO 98/100%   ?  ? Posture/Postural Control  ? Posture/Postural Control Postural limitations   ? Postural Limitations Rounded Shoulders;Forward head   ?  ? Sensation  ? Light Touch Impaired by gross assessment   ? Additional Comments L facial and bilateral neck numbness   ?  ? Coordination  ? Gross Motor Movements are Fluid and Coordinated Yes   ? Fine Motor Movements are Fluid and Coordinated Yes   ?  ? AROM  ? Overall AROM  Deficits   ?  ? Strength  ? Overall Strength Deficits   ?  ? Hand Function  ?  Right Hand Gross Grasp Functional   ? Left Hand Gross Grasp Functional   ? ?  ?  ? ?  ? ? ? LYMPHEDEMA/ONCOLOGY QUESTIONNAIRE - 12/08/21 1238   ? ?  ? Type  ? 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  ? Surgeries  ? Other Surgery Date --   total laryngectomy and LND 06/01/21- 6+/88 LN)  ? Number Lymph Nodes Removed 0   ?  ? Treatment  ? Past Chemotherapy Treatment Yes   ? Date --   currently on immunotherapy  ? Past Radiation Treatment No   ?  ? What other symptoms do you have  ? Are you Having Heaviness or Tightness Yes   ? Are you having Pain No   ? Are you having pitting edema No   ? Is it Hard or Difficult finding clothes that fit Yes   ? Do you have infections No   ?  ? Lymphedema Assessments  ? Lymphedema Assessments Head and Neck   ?  ? Head and Neck  ? Right Lateral Nostril at base of nose to medial tragus  --   TBA Rx visit 1  ? ?  ?  ? ?  ? ? ? ? ? ? ? ? ? OT Treatments/Exercises (OP) - 12/08/21 0001   ? ?  ? Transfers  ? Transfers Sit to Stand;Stand to Sit   ? Sit to Stand 7: Independent   ? Stand to Sit 7: Independent   ?  ? Manual Therapy  ? Manual Therapy Edema management   ? ?  ?  ? ?  ? ? ? ? ? ? ? ? ? OT Education - 12/08/21 1342   ? ? Education Details Provided Pt and family education regarding lymphatic structure and function, etiology, onset patterns and stages of progression. Taught interaction between blood circulatory system and lymphatic  circulation.Discussed  impact of gravity and co-morbidities on lymphatic function. Outlined Complete Decongestive Therapy (CDT)  as standard of care and provided in depth information regarding 4 primary components

## 2021-12-16 ENCOUNTER — Other Ambulatory Visit: Payer: Self-pay

## 2021-12-16 ENCOUNTER — Encounter: Payer: Medicaid Other | Admitting: Occupational Therapy

## 2021-12-20 ENCOUNTER — Ambulatory Visit: Payer: Medicaid Other | Attending: Gerontology | Admitting: Occupational Therapy

## 2021-12-20 DIAGNOSIS — I890 Lymphedema, not elsewhere classified: Secondary | ICD-10-CM | POA: Diagnosis present

## 2021-12-20 NOTE — Therapy (Signed)
[facility] MAIN [facility] SERVICES [address] [place], [place], [address] Phone: [phone]   Fax:  [phone]  Occupational Therapy Treatment  Patient Details  Name: Luis [name] MRN: [id number] Date of Birth: Jan 04, 1971 Referring Provider (OT): Deidre Ala, MD   Encounter Date: 12/20/2021   OT End of Session - 12/20/21 1119     Visit Number 2    Number of Visits 36    Date for OT Re-Evaluation 03/06/22    OT Start Time 1105    OT Stop Time 1215    OT Time Calculation (min) 70 min    Activity Tolerance Patient tolerated treatment well;No increased pain    Behavior During Therapy [facility] for tasks assessed/performed             Past Medical History:  Diagnosis Date   ACS (acute coronary syndrome) (HCC) 05/20/2019   Back pain    Diabetes mellitus without complication (HCC)    Drug addiction in remission (HCC)    High cholesterol    Hypertension    Knee pain    Myocardial infarction (HCC) 05/2019   Neuropathy     Past Surgical History:  Procedure Laterality Date   HERNIA REPAIR     KNEE SURGERY Right    LEFT HEART CATH AND CORONARY ANGIOGRAPHY N/A 05/21/2019   Procedure: LEFT HEART CATH AND CORONARY ANGIOGRAPHY;  Surgeon: Marcina Millard, MD;  Location: [facility] INVASIVE CV LAB;  Service: Cardiovascular;  Laterality: N/A;    There were no vitals filed for this visit.   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 Pt did not undergo XRT. Pt reports he did not yet meet with the [facility] SLP re   voice prostheses as his appointment was rescheduled. Pt has no new complaints. Status is unchanged since last visit, which was his inityial evaluation.    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 S/P total laryngectomy w B LND ( 6+/88). 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    Limitations unable to speak ( no voice prosthesis at present), difficulty swallowing, difficulty eating (teeth extracted, no dentures to date) chronic head/neck swelling and associated pain,  decreased cervical AROM, decreased L shoulder strength ( extension 4/5)    Special Tests Face and Neck measurements TBA Rx visit 2; Intake FOTO score 98/100    Patient Stated Goals reduce head/neck/facial swelling  and pain, and leep it from getting worse    Currently in Pain? Yes    Pain Location Throat    Pain Orientation Right;Left    Pain Descriptors / Indicators Heaviness;Numbness;Tightness   fullness   Pain Onset Other (comment)   s/p surgery                LYMPHEDEMA/ONCOLOGY QUESTIONNAIRE - 12/20/21 1251       What other symptoms do you have   Other Symptoms 6 R Chin to medial canthus = 11.5 cm. / L Chin to medial canthus = 13 cm / 7 R Mandible to chin =16 cm.  L Mandible to chin = 16      Head and Neck   Right Lateral Nostril at base of nose to medial tragus  20 cm   1 R Tragus to chin   Left Lateral Nostril at base of nose to medial tragus  15.5 cm   1 L Tragus to chin   Right Corner of mouth to where ear lobe meets face 14.5 cm   2 R Tragus to mouth corner   Left Corner of mouth to where ear lobe meets face 12 cm   2 L Tragus to mouth corner   4 cm superior to sternal notch around neck --   superior neck circ =55 cm. Middle neck circ + 50.5 cm. Inferior neck = 51.5 cm. Moth opening 3 cm   6 cm superior to sternal notch around neck --   Circ of face ( chin over cron of head 1 cm forward of  tragus) = 73 cm   Other 3 R Mandible to nasal  wing= 13 cm. / 3 L  Mandible to nasal wing= 13.5 cm    Other 4 R Mandible to medial canthus = 13 cm/ L Mandible to medial canthus= 12.5 cm    Other 5 R Mandible to exocanthus = 11 cm. L Mandible to exocanthus = 9.5 cm                             OT Education - 12/20/21 1336     Education Details Continued skilled Pt/caregiver education  And LE ADL training throughout visit for lymphedema self care/ home program, including compression wrapping, compression garment and device wear/care, lymphatic pumping ther ex, simple self-MLD, and skin care. Discussed initial volumetric measurements. Discussed all long term goals.    Person(s) Educated Patient    Methods Explanation;Demonstration;Handout    Comprehension Verbalized understanding;Returned demonstration;Need further instruction                 OT Long Term Goals - 12/08/21 1303       OT LONG TERM GOAL #1   Title Given his functional score  of 98/100 on the FOTO outcomes measure, Pt will increase functional score by 2 Pts to 100% to improve basic and instrumental ADLs performance and QOL.    Baseline 98/100    Time 12    Period Weeks    Status New    Target Date 03/06/22      OT LONG TERM GOAL #2   Title Pt will verbalize understanding of lymphedema precautions and prevention strategies by identifying and discussing how s/he may implement five precautions into daily life using printed reference (modified assistance) to reduce risk of progression and to limit infection risk.    Baseline Max A    Time 4    Period Days    Status New    Target Date --   4th Rx visit     OT LONG TERM GOAL #3   Title Pt will achieve at least a 10% volume reductions to return head and neck to more typical size and shape, to limit infection risk and LE progression, to decrease pain, to improve function, and to improve body image and QOL.    Baseline TBA at Rx visit 1    Time 12    Period Weeks    Status New    Target Date  03/06/22      OT LONG TERM GOAL #4  Title Pt will achieve independent performance of,  and sustain no less than 85% compliance with all LE self-care home program components throughout CDT, including modified simple self-MLD, daily skin care and inspection,  lymphatic pumping the ex and appropriate compression to limit lymphedema progression and to limit further functional decline.    Baseline Max A    Time 12    Period Weeks    Status New    Target Date 03/06/22      OT LONG TERM GOAL #5   Title Pt will regain full neck AROM  in all planes, essential for optimal functional performance and safety.    Baseline Max A    Time 12    Period Weeks    Status New    Target Date 03/06/22      Long Term Additional Goals   Additional Long Term Goals Yes      OT LONG TERM GOAL #6   Title Pt will obtain proper compression garments/compression braces and be independent with donning/doffing to optimize/stabilize with fluid reduction in 6 visits.    Baseline Max A    Time 6    Period Weeks    Status New    Target Date 12/22/20      OT LONG TERM GOAL #7   Title Pt will achieve reduction in pain and sensory discomfort in head and neck from initiall reported 8-9/ 10 to no more than 5/10 by end of Intensive to improve psychological coping, mood and QOL.    Baseline 8-9/10 (suspect neurogenic)    Time 12    Period Weeks    Status New    Target Date 03/06/22                   Plan - 12/20/21 1339     Clinical Impression Statement Completed initial face and neck measurments using the MD Anderson composite scale. R facial composite = 99 cm. L facial composit = 92 cm. Neck composite = 157 cm. We also measured the circumference of the face (chin to crown of the head circumferentially with tape aligned 1 cm forward of middle tragus bilaterally) = 73 cm. The cut off point for Trismus, a restricted mouth opening, is noted in the literatiure as 35 mm. Pt's mouth opening measured 30 mm this date.  Pt educated on Flexitouch sequential pneumatic device and he is in agreement with plan for trial in the clinic. That is in process with manufacturer's rep already reviewing insurance benefits. Next visit commence MLD. Cont as per POC.    OT Occupational Profile and History Detailed Assessment- Review of Records and additional review of physical, cognitive, psychosocial history related to current functional performance    Occupational performance deficits (Please refer to evaluation for details): ADL's;IADL's;Rest and Sleep;Work;Leisure;Social Participation;Other   body image   Body Structure / Function / Physical Skills ADL;ROM;Scar mobility;IADL;Edema;Skin integrity;Improper spinal/pelvic alignment;Fascial restriction;Strength;Decreased knowledge of precautions;Pain;Decreased knowledge of use of DME;Flexibility    Rehab Potential Good    Clinical Decision Making Several treatment options, min-mod task modification necessary    Comorbidities Affecting Occupational Performance: Presence of comorbidities impacting occupational performance    Comorbidities impacting occupational performance description: See SUBJECTIVE    Modification or Assistance to Complete Evaluation  Min-Moderate modification of tasks or assist with assess necessary to complete eval    OT Frequency 1x / week   2 x weekly recommended   but Pt requests 1 x only   OT Duration 12 weeks  2 x weekly recommended, but Pt requests 1 x only due increased burden of attending multiple, frequency medical appointments   OT Treatment/Interventions Self-care/ADL training;Therapeutic exercise;Manual Therapy;Coping strategies training;Therapeutic activities;Manual lymph drainage;DME and/or AE instruction;Compression bandaging;Other (comment);Scar mobilization;Patient/family education;Passive range of motion   skin care, kinesiotaping, myofascial release   Plan modified Intensive Phase Complete Decongestive Therapy (CDT: Manual lymphatic Drainage (MLD),  compression, skin care, ther ex    OT Home Exercise Plan Set up trial in clinic with manufacturer's rep of Flexitouch, advanced, sequential pneumatic compression device specifically for head and neck  lymphatic decongestion.    Recommended Other Services [email or web address]    Consulted and Agree with Plan of Care Patient;Family member/caregiver             Patient will benefit from skilled therapeutic intervention in order to improve the following deficits and impairments:   Body Structure / Function / Physical Skills: ADL, ROM, Scar mobility, IADL, Edema, Skin integrity, Improper spinal/pelvic alignment, Fascial restriction, Strength, Decreased knowledge of precautions, Pain, Decreased knowledge of use of DME, Flexibility       Visit Diagnosis: Lymphedema, not elsewhere classified    Problem List Patient Active Problem List   Diagnosis Date Noted   Seasonal allergies 05/06/2021   Peripheral neuropathy, severe 03/16/2021   Uncontrolled type 2 diabetes mellitus 03/26/2020   Bilateral lower extremity edema 03/26/2020   History of non-ST elevation myocardial infarction (NSTEMI) 05/20/2019   Dyslipidemia 07/06/2018   GERD (gastroesophageal reflux disease) 07/05/2018   Obesity, unspecified 06/19/2017   Hypertension 12/01/2016   Methadone maintenance therapy patient (HCC) 02/24/2014   OSA (obstructive sleep apnea) 05/06/2013   Chronic back pain 12/25/2011   Loel Dubonnet, MS, OTR/L, CLT-LANA 12/20/21 1:52 PM   [facility] MAIN [facility] SERVICES [address] [place], [place], [address] Phone: [phone]   Fax:  [phone]  Name: Henok Delozier MRN: [id number] Date of Birth: 03/05/1971

## 2021-12-27 ENCOUNTER — Ambulatory Visit: Payer: Medicaid Other | Admitting: Occupational Therapy

## 2021-12-27 DIAGNOSIS — I890 Lymphedema, not elsewhere classified: Secondary | ICD-10-CM | POA: Diagnosis not present

## 2021-12-29 NOTE — Therapy (Signed)
?[facility] MAIN REH[facility] SERVICES ?[place], [place], [address] ?Phone: [phone]   Fax:  [phone] ? ?Occupational Therapy Treatment ? ?Patient Details  ?Name: Luis [name] ?MRN: [id number] ?Date of Birth: 02-Aug-1971 ?Referring Provider (OT): Shetal Luberta Robertson, MD ? ? ?Encounter Date: 12/27/2021 ? ? OT End of Session - 12/28/21 0943   ? ? Visit Number 3   ? Number of Visits 36   ? Date for OT Re-Evaluation 03/06/22   ? OT Start Time 0103   ? OT Stop Time 0205   ? OT Time Calculation (min) 62 min   ? Activity Tolerance Patient tolerated treatment well;No increased pain   ? Behavior During Therapy [facility] for tasks assessed/performed   ? ?  ?  ? ?  ? ? ?Past Medical History:  ?Diagnosis Date  ? ACS (acute coronary syndrome) ([place]) 05/20/2019  ? Back pain   ? Diabetes mellitus without complication ([place])   ? Drug addiction in remission [facility])   ? High cholesterol   ? Hypertension   ? Knee pain   ? Myocardial infarction ([place]) 05/2019  ? Neuropathy   ? ? ?Past Surgical History:  ?Procedure Laterality Date  ? HERNIA REPAIR    ? KNEE SURGERY Right   ? LEFT HEART CATH AND CORONARY ANGIOGRAPHY N/A 05/21/2019  ? Procedure: LEFT HEART CATH AND CORONARY ANGIOGRAPHY;  Surgeon: Isaias Cowman, MD;  Location: [place] CV LAB;  Service: Cardiovascular;  Laterality: N/A;  ? ? ?There were no vitals filed for this visit. ? ? Subjective Assessment - 12/29/21 0948   ? ? 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 Pt did not undergo XRT. Pt reports he did meet with the [facility] SLP rand looks forward to obtaining a voice prostheses at thewir next meeting. Pt reports pain/ discomfort is unchanged, but he does not rate it numerically.   ? Patient is accompanied by: --   Mother  ? 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 S/P total laryngectomy w B LND ( 6+/88). 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   ? Limitations unable to speak ( no voice prosthesis at present), difficulty swallowing, difficulty eating (teeth extracted, no dentures to date) chronic head/neck swelling and associated pain,  decreased cervical AROM, decreased L shoulder strength ( extension 4/5)   ? Special Tests Face and Neck measurements TBA Rx visit 2; Intake FOTO score 98/100   ? Patient Stated Goals reduce head/neck/facial swelling  and pain, and leep it from getting worse   ? Pain Onset Other (comment)   s/p surgery  ? ?  ?  ? ?  ? ? ? ? ? ? ? ? ? ? ? ? ? ? ? OT Treatments/Exercises (OP) - 12/29/21 [id number]   ? ?  ? ADLs  ? ADL Education Given Yes   ?  ? Manual Therapy  ? Manual Therapy Edema management;Manual Lymphatic Drainage (MLD);Soft tissue mobilization;Myofascial release   ? Myofascial Release st origin of sternocleidomastoid   ? Manual Lymphatic Drainage (MLD) MLD to bilateral head and neck utilizing deep biaphragmatic breathing  , bilateral in tact  axillary LN, clavicular     nodes. Provided scar massage to surgical site. No facial MLD this date.   ? ?  ?  ? ?  ? ? ? ? ? ? ? ? ? OT Education - 12/29/21 0954   ? ? Education Details Continued skilled Pt/caregiver education  And LE ADL training throughout visit for lymphedema self care/ home program, including compression wrapping, compression garment and device wear/care, lymphatic pumping ther ex, simple self-MLD, and skin care. Discussed initial volumetric measurements. Discussed all long term goals.   ? Person(s) Educated Patient   ? Methods Explanation;Demonstration;Handout   ? Comprehension Verbalized understanding;Returned demonstration;Need  further instruction   ? ?  ?  ? ?  ? ? ? ? ? ? OT Long Term Goals - 12/08/21 1303   ? ?  ? OT LONG TERM GOAL #1  ? Title Given his functional score  of 98/100 on the FOTO outcomes measure, Pt will increase functional score by 2 Pts to 100% to improve basic and instrumental ADLs performance and QOL.   ? Baseline 98/100   ? Time 12   ? Period Weeks   ? Status New   ? Target Date 03/06/22   ?  ? OT LONG TERM GOAL #2  ? Title Pt will verbalize understanding of lymphedema precautions and prevention strategies by identifying and discussing how s/he may implement five precautions into daily life using printed reference (modified assistance) to reduce risk of progression and to limit infection risk.   ? Baseline Max A   ? Time 4   ? Period Days   ? Status New   ? Target Date --   4th Rx visit  ?  ? OT LONG TERM GOAL #3  ? Title Pt will achieve at least a 10% volume reductions to return head and neck to more typical size and shape, to limit infection risk and LE progression, to decrease pain, to improve function, and to improve body image and QOL.   ? Baseline TBA at Rx visit 1   ? Time 12   ? Period Weeks   ? Status New   ? Target Date 03/06/22   ?  ? OT LONG TERM GOAL #4  ? Title Pt will achieve independent performance of,  and sustain no less than 85% compliance with all LE self-care home program components throughout CDT, including modified simple self-MLD, daily skin care and inspection,  lymphatic pumping the ex and appropriate compression to limit lymphedema progression and to limit further functional decline.   ? Baseline Max A   ? Time 12   ? Period Weeks   ? Status New   ? Target Date 03/06/22   ?  ? OT LONG TERM GOAL #5  ? Title Pt will regain full neck AROM  in all planes, essential for optimal functional performance and safety.   ? Baseline Max A   ? Time 12   ? Period Weeks   ? Status New   ? Target Date 03/06/22   ?  ? Long Term Additional Goals  ? Additional Long Term Goals Yes   ?  ? OT LONG TERM GOAL #6  ?  Title Pt will obtain proper compression garments/compression braces and be independent with donning/doffing to optimize/stabilize with fluid reduction in 6 visits.   ? Baseline Max A   ? Time 6   ? Period Weeks   ? Status New   ? Target Date 12/22/20   ?  ?  OT LONG TERM GOAL #7  ? Title Pt will achieve reduction in pain and sensory discomfort in head and neck from initiall reported 8-9/ 10 to no more than 5/10 by end of Intensive to improve psychological coping, mood and QOL.   ? Baseline 8-9/10 (suspect neurogenic)   ? Time 12   ? Period Weeks   ? Status New   ? Target Date 03/06/22   ? ?  ?  ? ?  ? ? ? ? ? ? ? ? Plan - 12/29/21 0944   ? ? Clinical Impression Statement Commenced head and neck MLD with scar massage and gentle myofacial release at sternocleidomastoid origin. Pt taght basic scar massage at surgical site and also bilateral lateral neck flexion stretches. Pt tolerated all manual techniques without pain. Cont as per POC.   ? OT Occupational Profile and History Detailed Assessment- Review of Records and additional review of physical, cognitive, psychosocial history related to current functional performance   ? Occupational performance deficits (Please refer to evaluation for details): ADL's;IADL's;Rest and Sleep;Work;Leisure;Social Participation;Other   body image  ? Body Structure / Function / Physical Skills ADL;ROM;Scar mobility;IADL;Edema;Skin integrity;Improper spinal/pelvic alignment;Fascial restriction;Strength;Decreased knowledge of precautions;Pain;Decreased knowledge of use of DME;Flexibility   ? Rehab Potential Good   ? Clinical Decision Making Several treatment options, min-mod task modification necessary   ? Comorbidities Affecting Occupational Performance: Presence of comorbidities impacting occupational performance   ? Comorbidities impacting occupational performance description: See SUBJECTIVE   ? Modification or Assistance to Complete Evaluation  Min-Moderate modification of tasks or  assist with assess necessary to complete eval   ? OT Frequency 1x / week   2 x weekly recommended   but Pt requests 1 x only  ? OT Duration 12 weeks   2 x weekly recommended, but Pt requests 1 x only due in

## 2022-01-03 ENCOUNTER — Ambulatory Visit: Payer: Medicaid Other | Admitting: Occupational Therapy

## 2022-01-03 DIAGNOSIS — I890 Lymphedema, not elsewhere classified: Secondary | ICD-10-CM

## 2022-01-03 NOTE — Therapy (Signed)
[facility] [facility] ?[place], [place], [address] ?Phone: [phone]   Fax:  [phone] ? ?Occupational Therapy Treatment ? ?Patient Details  ?Name: Luis [name] ?MRN: [id number] ?Date of Birth: 18-Sep-1970 ?Referring Provider (OT): Shetal Luberta Robertson, MD ? ? ?Encounter Date: 01/03/2022 ? ? OT End of Session - 01/03/22 1421   ? ? Visit Number 4   ? Number of Visits 36   ? Date for OT Re-Evaluation 03/06/22   ? OT Start Time 0100   ? OT Stop Time 0200   ? OT Time Calculation (min) 60 min   ? Activity Tolerance Patient tolerated treatment well;No increased pain   ? Behavior During Therapy [facility] for tasks assessed/performed   ? ?  ?  ? ?  ? ? ?Past Medical History:  ?Diagnosis Date  ? ACS (acute coronary syndrome) ([place]) 05/20/2019  ? Back pain   ? Diabetes mellitus without complication ([place])   ? Drug addiction in remission [facility])   ? High cholesterol   ? Hypertension   ? Knee pain   ? Myocardial infarction ([place]) 05/2019  ? Neuropathy   ? ? ?Past Surgical History:  ?Procedure Laterality Date  ? HERNIA REPAIR    ? KNEE SURGERY Right   ? LEFT HEART CATH AND CORONARY ANGIOGRAPHY N/A 05/21/2019  ? Procedure: LEFT HEART CATH AND CORONARY ANGIOGRAPHY;  Surgeon: Isaias Cowman, MD;  Location: [facility];  Service: Cardiovascular;  Laterality: N/A;  ? ? ?There were no vitals filed for this visit. ? ? Subjective Assessment - 01/03/22 1439   ? ? 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 Pt reports he went to the beach over the weekend and had a wonderful time. He also reports he had a procedure at [facility] last week to place a voice prostesis through his stoma. He is looking forward to learning how to speak using this device.   ? Patient is accompanied by: --   Mother  ? 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 S/P  total laryngectomy w B LND ( 6+/88). 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   ? Limitations unable to speak ( no voice prosthesis at present), difficulty swallowing, difficulty eating (teeth extracted, no dentures to date) chronic head/neck swelling and associated pain,  decreased cervical AROM, decreased L shoulder strength ( extension 4/5)   ? Special Tests Face and Neck measurements TBA Rx visit 2; Intake FOTO score 98/100   ? Patient Stated Goals reduce head/neck/facial swelling  and pain, and leep it from getting worse   ? Currently in Pain? No/denies   ? Pain Onset Other (comment)   s/p surgery  ? ?  ?  ? ?  ? ? ? ? ? ? LYMPHEDEMA/ONCOLOGY QUESTIONNAIRE - 01/03/22 1449   ? ?  ? What other symptoms do you have  ? Are you Having Heaviness or Tightness --   R facial Point-to-point composite = 99. L facial P-t-p composite= 92. Cacial circ. composite = 108. neck composite =157. Mouth opening 3 cm ( WNL= 4-5 cm)  ? Other Symptoms 6 R Chin to medial canthus = 11.5 cm. / L Chin to medial canthus = 13 cm / 7 R Mandible to  chin =16 cm. L Mandible to chin = 16   ?  ? Head and Neck  ? Right Lateral Nostril at base of nose to medial tragus  20 cm   1 R Tragus to chin  ? Left Lateral Nostril at base of nose to medial tragus  15.5 cm   1 L Tragus to chin  ? Right Corner of mouth to where ear lobe meets face 14.5 cm   2 R Tragus to mouth corner  ? Left Corner of mouth to where ear lobe meets face 12 cm   2 L Tragus to mouth corner  ? 4 cm superior to sternal notch around neck --   superior neck circ =55 cm. Middle neck circ + 50.5 cm. Inferior neck = 51.5 cm. Moth opening 3 cm  ? 6 cm superior to sternal notch around neck --   Circ of face ( chin over cron of head 1 cm forward of  tragus) = 73 cm  ? Other 3 R Mandible to nasal wing= 13 cm. / 3 L  Mandible to nasal wing=  13.5 cm   ? Other 4 R Mandible to medial canthus = 13 cm/ L Mandible to medial canthus= 12.5 cm   ? Other 5 R Mandible to exocanthus = 11 cm. L Mandible to exocanthus = 9.5 cm   ? ?  ?  ? ?  ? ? ? ? ? ? ? ? ? ? OT Treatments/Exercises (OP) - 01/03/22 1455   ? ?  ? ADLs  ? ADL Education Given Yes   ?  ? Manual Therapy  ? Manual Therapy Edema management;Manual Lymphatic Drainage (MLD);Soft tissue mobilization   ? Edema Management completed MD Anderson facial and head/neck measurements for initial composite scores to measure swelling   ? Manual Lymphatic Drainage (MLD) MLD to bilateral head and neck utilizing deep biaphragmatic breathing  , bilateral in tact  axillary LN, clavicular     nodes. Provided scar massage to surgical site. No facial MLD this date.   ? ?  ?  ? ?  ? ? ? ? ? ? ? ? ? OT Education - 01/03/22 1456   ? ? Education Details Pt edu re some off-the-shelf  compression garment options, kinesiotape and lymph node map in relation to MLD   ? Person(s) Educated Patient   ? Methods Explanation;Demonstration;Handout   ? Comprehension Verbalized understanding;Returned demonstration;Need further instruction   ? ?  ?  ? ?  ? ? ? ? ? ? OT Long Term Goals - 12/08/21 1303   ? ?  ? OT LONG TERM GOAL #1  ? Title Given his functional score  of 98/100 on the FOTO outcomes measure, Pt will increase functional score by 2 Pts to 100% to improve basic and instrumental ADLs performance and QOL.   ? Baseline 98/100   ? Time 12   ? Period Weeks   ? Status New   ? Target Date 03/06/22   ?  ? OT LONG TERM GOAL #2  ? Title Pt will verbalize understanding of lymphedema precautions and prevention strategies by identifying and discussing how s/he may implement five precautions into daily life using printed reference (modified assistance) to reduce risk of progression and to limit infection risk.   ? Baseline Max A   ? Time 4   ? Period Days   ? Status New   ? Target Date --   4th Rx visit  ?  ? OT LONG TERM  GOAL #3  ? Title Pt will  achieve at least a 10% volume reductions to return head and neck to more typical size and shape, to limit infection risk and LE progression, to decrease pain, to improve function, and to improve body image and QOL.   ? Baseline TBA at Rx visit 1   ? Time 12   ? Period Weeks   ? Status New   ? Target Date 03/06/22   ?  ? OT LONG TERM GOAL #4  ? Title Pt will achieve independent performance of,  and sustain no less than 85% compliance with all LE self-care home program components throughout CDT, including modified simple self-MLD, daily skin care and inspection,  lymphatic pumping the ex and appropriate compression to limit lymphedema progression and to limit further functional decline.   ? Baseline Max A   ? Time 12   ? Period Weeks   ? Status New   ? Target Date 03/06/22   ?  ? OT LONG TERM GOAL #5  ? Title Pt will regain full neck AROM  in all planes, essential for optimal functional performance and safety.   ? Baseline Max A   ? Time 12   ? Period Weeks   ? Status New   ? Target Date 03/06/22   ?  ? Long Term Additional Goals  ? Additional Long Term Goals Yes   ?  ? OT LONG TERM GOAL #6  ? Title Pt will obtain proper compression garments/compression braces and be independent with donning/doffing to optimize/stabilize with fluid reduction in 6 visits.   ? Baseline Max A   ? Time 6   ? Period Weeks   ? Status New   ? Target Date 12/22/20   ?  ? OT LONG TERM GOAL #7  ? Title Pt will achieve reduction in pain and sensory discomfort in head and neck from initiall reported 8-9/ 10 to no more than 5/10 by end of Intensive to improve psychological coping, mood and QOL.   ? Baseline 8-9/10 (suspect neurogenic)   ? Time 12   ? Period Weeks   ? Status New   ? Target Date 03/06/22   ? ?  ?  ? ?  ? ? ? ? ? ? ? ? Plan - 01/03/22 1422   ? ? Clinical Impression Statement Completed initial head and neck measurements for composite scores using the MD Ruth. R composit facial score for 7 point to point measurements = 99. L  point-to-point composite facial score = 92. Facial circumference composit score ( 2)= 108. Initial neck composite using 3 circumferences= 157. Mouth opening = 3 cm, which is below typical mouth opening of

## 2022-01-06 ENCOUNTER — Encounter: Payer: Medicaid Other | Admitting: Occupational Therapy

## 2022-01-10 ENCOUNTER — Ambulatory Visit: Payer: Medicaid Other | Admitting: Occupational Therapy

## 2022-01-13 ENCOUNTER — Encounter: Payer: Medicaid Other | Admitting: Occupational Therapy

## 2022-01-19 ENCOUNTER — Ambulatory Visit: Payer: Medicaid Other | Admitting: Occupational Therapy

## 2022-01-19 DIAGNOSIS — I890 Lymphedema, not elsewhere classified: Secondary | ICD-10-CM

## 2022-01-20 ENCOUNTER — Encounter: Payer: Medicaid Other | Admitting: Occupational Therapy

## 2022-01-20 NOTE — Therapy (Signed)
[place] [facility] MAIN [facility] SERVICES [address] [place], [place], [address] Phone: [phone]   Fax:  [phone]  Occupational Therapy Treatment  Patient Details  Name: Luis [name] MRN: [id number] Date of Birth: [birth year] Referring Provider (OT): Deidre Ala, MD   Encounter Date: 01/19/2022   OT End of Session - 01/19/22 1320     Visit Number 5    Number of Visits 36    Date for OT Re-Evaluation 03/06/22    OT Start Time 0100    OT Stop Time 0215    OT Time Calculation (min) 75 min    Activity Tolerance Patient tolerated treatment well;No increased pain    Behavior During Therapy [facility] for tasks assessed/performed             Past Medical History:  Diagnosis Date   ACS (acute coronary syndrome) ([facility]) 05/20/2019   Back pain    Diabetes mellitus without complication ([facility])    Drug addiction in remission ([facility])    High cholesterol    Hypertension    Knee pain    Myocardial infarction ([facility]) 05/2019   Neuropathy     Past Surgical History:  Procedure Laterality Date   HERNIA REPAIR     KNEE SURGERY Right    LEFT HEART CATH AND CORONARY ANGIOGRAPHY N/A 05/21/2019   Procedure: LEFT HEART CATH AND CORONARY ANGIOGRAPHY;  Surgeon: Marcina Millard, MD;  Location: [facility] INVASIVE CV LAB;  Service: Cardiovascular;  Laterality: N/A;    There were no vitals filed for this visit.   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 Pt presents with voice prostesis in place. Manufacturer's reps for [employer], Deirdre Peer, is here today to assist Luis [name]/ a trial of the advanced Flexitouch sequential pneumatic compression device [id number]) to address head and neck lymphedema secondary to Laryngeal cancer. The advanced Flexitouch is medically necessary to help sustain self-management of head and neck lymphedema over time at home.     Patient is accompanied by: --   device manufacturer's rep.   Pertinent History Dx stage IV Larynx Ca [name] lung nodules 05/23/2021 with numerous pulmonary nodules consistent with matastasis. S/P total laryngectomy [name] B LND ( 6+/88). 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    Limitations unable to speak ( no voice prosthesis at present), difficulty swallowing, difficulty eating (teeth extracted, no dentures to date) chronic head/neck swelling and associated pain,  decreased cervical AROM, decreased L shoulder strength ( extension 4/5)    Special Tests Face and Neck measurements TBA Rx visit 2; Intake FOTO score 98/100    Patient Stated Goals reduce head/neck/facial swelling  and pain, and leep it from getting worse    Pain Onset Other (comment)   s/p surgery                         OT Treatments/Exercises (OP) - 01/20/22 1736       Manual Therapy   Manual Therapy Edema management;Manual Lymphatic Drainage (MLD);Soft tissue mobilization    Edema Management completed trial of advanced sequential pneumatic compression device, or "pump" from [employer]. This proven device is medically necessary for this head and neck lymphedema patient to ensure optimal , long term lymphedema  self management over time , and to limit further lymphedema-related functional decline.                    OT Education - 01/20/22 1738     Education Details Pt/ family education for basic and advanced sequential pneumatic compression devices, or "pump", including clinical indications, how they work, how the basic device differs from the advanced, clinical indications, contraindications and precautions and care and use routines for pneumatic compression devices. Patient/ caregiver's questions were answered throughout trial and handouts and Internet  resources given for reference.    Person(s) Educated Patient    Methods Explanation;Demonstration;Handout    Comprehension Verbalized understanding;Returned demonstration                 OT Long Term Goals - 12/08/21 1303       OT LONG TERM GOAL #1   Title Given his functional score  of 98/100 on the FOTO outcomes measure, Pt will increase functional score by 2 Pts to 100% to improve basic and instrumental ADLs performance and QOL.    Baseline 98/100    Time 12    Period Weeks    Status New    Target Date 03/06/22      OT LONG TERM GOAL #2   Title Pt will verbalize understanding of lymphedema precautions and prevention strategies by identifying and discussing how s/he may implement five precautions into daily life using printed reference (modified assistance) to reduce risk of progression and to limit infection risk.    Baseline Max A    Time 4    Period Days    Status New    Target Date --   4th Rx visit     OT LONG TERM GOAL #3   Title Pt will achieve at least a 10% volume reductions to return head and neck to more typical size and shape, to limit infection risk and LE progression, to decrease pain, to improve function, and to improve body image and QOL.    Baseline TBA at Rx visit 1    Time 12    Period Weeks    Status New    Target Date 03/06/22      OT LONG TERM GOAL #4   Title Pt will achieve independent performance of,  and sustain no less than 85% compliance with all LE self-care home program components throughout CDT, including modified simple self-MLD, daily skin care and inspection,  lymphatic pumping the ex and appropriate compression to limit lymphedema progression and to limit further functional decline.    Baseline Max A    Time 12    Period Weeks    Status New    Target Date 03/06/22      OT LONG TERM GOAL #5   Title Pt will regain full neck AROM  in all planes, essential for optimal functional performance and safety.    Baseline Max A    Time 12     Period Weeks    Status New    Target Date 03/06/22      Long Term Additional Goals   Additional Long Term Goals Yes      OT LONG TERM GOAL #6   Title Pt will obtain proper compression garments/compression braces and be independent with donning/doffing to optimize/stabilize with fluid reduction in 6 visits.    Baseline Max A    Time 6    Period Weeks    Status New    Target Date 12/22/20  OT LONG TERM GOAL #7   Title Pt will achieve reduction in pain and sensory discomfort in head and neck from initiall reported 8-9/ 10 to no more than 5/10 by end of Intensive to improve psychological coping, mood and QOL.    Baseline 8-9/10 (suspect neurogenic)    Time 12    Period Weeks    Status New    Target Date 03/06/22                   Plan - 01/19/22 1320     Clinical Impression Statement Luis [name] has failed conservative lymphedema therapy,  including therapeutic exercise, manual lymphatic drainage (MLD) ,  skin care, and compression therapy for head and neck lymphedema (LE) secondary to larnyx     cancer from 12/04/21 - 01/19/22 with significant chronic swelling and sensory symptomsremaining. Pt contineues to present with chronic face and neck swelling, increased palpable tissue fibrosis and difficulty swallowing and speaking. He underwent a trial of the advanced Flexitouch sequential pneumatic compression device today , which is medically necessary to assist with long term, daily lymphedema self-care and lymphedema management at home. He utilized in the clinic for ~ 35 minutes without increased pain or SOB,  and emerged after the session with a marked reduction in facial swelling by both visual assessment and palpation. Luis [name] will benefit from the Flexitouch device to limit lkymphedema progression and further functional decline.    OT Occupational Profile and History Detailed Assessment- Review of Records and additional review of physical, cognitive, psychosocial history  related to current functional performance    Occupational performance deficits (Please refer to evaluation for details): ADL's;IADL's;Rest and Sleep;Work;Leisure;Social Participation;Other   body image   Body Structure / Function / Physical Skills ADL;ROM;Scar mobility;IADL;Edema;Skin integrity;Improper spinal/pelvic alignment;Fascial restriction;Strength;Decreased knowledge of precautions;Pain;Decreased knowledge of use of DME;Flexibility    Rehab Potential Good    Clinical Decision Making Several treatment options, min-mod task modification necessary    Comorbidities Affecting Occupational Performance: Presence of comorbidities impacting occupational performance    Comorbidities impacting occupational performance description: See SUBJECTIVE    Modification or Assistance to Complete Evaluation  Min-Moderate modification of tasks or assist with assess necessary to complete eval    OT Frequency 1x / week   2 x weekly recommended   but Pt requests 1 x only   OT Duration 12 weeks   2 x weekly recommended, but Pt requests 1 x only due increased burden of attending multiple, frequency medical appointments   OT Treatment/Interventions Self-care/ADL training;Therapeutic exercise;Manual Therapy;Coping strategies training;Therapeutic activities;Manual lymph drainage;DME and/or AE instruction;Compression bandaging;Other (comment);Scar mobilization;Patient/family education;Passive range of motion   skin care, kinesiotaping, myofascial release   Plan modified Intensive Phase Complete Decongestive Therapy (CDT: Manual lymphatic Drainage (MLD), compression, skin care, ther ex    OT Home Exercise Plan Set up trial in clinic with manufacturer's rep of Flexitouch, advanced, sequential pneumatic compression device specifically for head and neck  lymphatic decongestion.    Recommended Other Services [email or web address]    Consulted  and Agree with Plan of Care Patient;Family member/caregiver             Patient will benefit from skilled therapeutic intervention in order to improve the following deficits and impairments:   Body Structure / Function / Physical Skills: ADL, ROM, Scar mobility, IADL, Edema, Skin integrity, Improper spinal/pelvic alignment, Fascial restriction, Strength, Decreased knowledge of precautions, Pain, Decreased knowledge of use of DME, Flexibility       Visit Diagnosis: Lymphedema, not  elsewhere classified    Problem List Patient Active Problem List   Diagnosis Date Noted   Seasonal allergies 05/06/2021   Peripheral neuropathy, severe 03/16/2021   Uncontrolled type 2 diabetes mellitus 03/26/2020   Bilateral lower extremity edema 03/26/2020   History of non-ST elevation myocardial infarction (NSTEMI) 05/20/2019   Dyslipidemia 07/06/2018   GERD (gastroesophageal reflux disease) 07/05/2018   Obesity, unspecified 06/19/2017   Hypertension 12/01/2016   Methadone maintenance therapy patient (HCC) 02/24/2014   OSA (obstructive sleep apnea) 05/06/2013   Chronic back pain 12/25/2011    Loel Dubonnet, MS, OTR/L, [facility] 01/20/22 5:40 PM   [place] [facility] MAIN [facility] SERVICES [address] [place], [place], [address] Phone: [phone]   Fax:  [phone]  Name: Luis [name] MRN: [id number] Date of Birth: 06-27-1971

## 2022-01-25 ENCOUNTER — Ambulatory Visit: Payer: Medicaid Other | Admitting: Occupational Therapy

## 2022-01-27 ENCOUNTER — Encounter: Payer: Medicaid Other | Admitting: Occupational Therapy

## 2022-02-01 ENCOUNTER — Ambulatory Visit: Payer: Medicaid Other | Attending: Gerontology | Admitting: Occupational Therapy

## 2022-02-01 DIAGNOSIS — I890 Lymphedema, not elsewhere classified: Secondary | ICD-10-CM | POA: Insufficient documentation

## 2022-02-01 NOTE — Therapy (Signed)
[place] [facility] MAIN [facility] SERVICES [address] [place], [place], [address] Phone: [phone]   Fax:  [phone]  Occupational Therapy Treatment  Patient Details  Name: Luis [name] MRN: [id number] Date of Birth: [birth year] Referring Provider (OT): Deidre Ala, MD   Encounter Date: 02/01/2022   OT End of Session - 02/01/22 1208     Visit Number 6    Number of Visits 36    Date for OT Re-Evaluation 03/06/22    OT Start Time 1107    OT Stop Time 1207    OT Time Calculation (min) 60 min    Activity Tolerance Patient tolerated treatment well;No increased pain    Behavior During Therapy [facility] for tasks assessed/performed             Past Medical History:  Diagnosis Date   ACS (acute coronary syndrome) (HCC) 05/20/2019   Back pain    Diabetes mellitus without complication (HCC)    Drug addiction in remission (HCC)    High cholesterol    Hypertension    Knee pain    Myocardial infarction (HCC) 05/2019   Neuropathy     Past Surgical History:  Procedure Laterality Date   HERNIA REPAIR     KNEE SURGERY Right    LEFT HEART CATH AND CORONARY ANGIOGRAPHY N/A 05/21/2019   Procedure: LEFT HEART CATH AND CORONARY ANGIOGRAPHY;  Surgeon: Marcina Millard, MD;  Location: [facility] INVASIVE CV LAB;  Service: Cardiovascular;  Laterality: N/A;    There were no vitals filed for this visit.   Subjective Assessment - 02/01/22 1216     Subjective  Luis [name] presents for OT Rx to address head and neck lymphedema 2/2 larnyx cancer. Pt states he feels unwell today, reorting pain along L jaw as 5-6/10.    Patient is accompanied by: --   device manufacturer's rep.   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 S/P total laryngectomy w B LND ( 6+/88). HTN, DM, CAD, obesity, Hx MI 2021, periferal neuropathy, chronic face and neck swelling and pain, neuropathy, OSA,  drug addiction (remission on Methadone maintenenance therapy), Hx tobacco abuse.    Limitations unable to speak ( no voice prosthesis at present), difficulty swallowing, difficulty eating (teeth extracted, no dentures to date) chronic head/neck swelling and associated pain,  decreased cervical AROM, decreased L shoulder strength ( extension 4/5)    Special Tests Face and Neck measurements TBA Rx visit 2; Intake FOTO score 98/100    Patient Stated Goals reduce head/neck/facial swelling  and pain, and leep it from getting worse    Currently in Pain? Yes    Pain Score 6     Pain Location Jaw    Pain Orientation Left    Pain Descriptors / Indicators Aching;Tingling;Pins and needles;Pressure    Pain Type Chronic pain    Pain Onset Other (comment)   s/p surgery                         OT Treatments/Exercises (OP) - 02/01/22 1218       Manual Therapy   Manual Therapy Edema management;Manual Lymphatic Drainage (MLD);Soft tissue mobilization    Manual Lymphatic Drainage (MLD) MLD to L head and neck utilizing diaphragmatic breathing  , axillary LN, clavicular nodes. J strokes along J towards ear and then neck, superior to inferior.  OT Education - 02/01/22 1220     Education Details Continued skilled Pt/caregiver education  And LE ADL training throughout visit for lymphedema self care/ home program, including compression wrapping, compression garment and device wear/care, lymphatic pumping ther ex, simple self-MLD, and skin care. Discussed initial volumetric measurements. Discussed all long term goals.    Person(s) Educated Patient    Methods Explanation;Demonstration;Handout    Comprehension Verbalized understanding;Returned demonstration;Need further instruction                 OT Long Term Goals - 12/08/21 1303       OT LONG TERM GOAL #1   Title Given his functional score  of 98/100 on the FOTO outcomes measure, Pt will increase  functional score by 2 Pts to 100% to improve basic and instrumental ADLs performance and QOL.    Baseline 98/100    Time 12    Period Weeks    Status New    Target Date 03/06/22      OT LONG TERM GOAL #2   Title Pt will verbalize understanding of lymphedema precautions and prevention strategies by identifying and discussing how s/he may implement five precautions into daily life using printed reference (modified assistance) to reduce risk of progression and to limit infection risk.    Baseline Max A    Time 4    Period Days    Status New    Target Date --   4th Rx visit     OT LONG TERM GOAL #3   Title Pt will achieve at least a 10% volume reductions to return head and neck to more typical size and shape, to limit infection risk and LE progression, to decrease pain, to improve function, and to improve body image and QOL.    Baseline TBA at Rx visit 1    Time 12    Period Weeks    Status New    Target Date 03/06/22      OT LONG TERM GOAL #4   Title Pt will achieve independent performance of,  and sustain no less than 85% compliance with all LE self-care home program components throughout CDT, including modified simple self-MLD, daily skin care and inspection,  lymphatic pumping the ex and appropriate compression to limit lymphedema progression and to limit further functional decline.    Baseline Max A    Time 12    Period Weeks    Status New    Target Date 03/06/22      OT LONG TERM GOAL #5   Title Pt will regain full neck AROM  in all planes, essential for optimal functional performance and safety.    Baseline Max A    Time 12    Period Weeks    Status New    Target Date 03/06/22      Long Term Additional Goals   Additional Long Term Goals Yes      OT LONG TERM GOAL #6   Title Pt will obtain proper compression garments/compression braces and be independent with donning/doffing to optimize/stabilize with fluid reduction in 6 visits.    Baseline Max A    Time 6    Period  Weeks    Status New    Target Date 12/22/20      OT LONG TERM GOAL #7   Title Pt will achieve reduction in pain and sensory discomfort in head and neck from initiall reported 8-9/ 10 to no more than 5/10 by end of Intensive to improve  psychological coping, mood and QOL.    Baseline 8-9/10 (suspect neurogenic)    Time 12    Period Weeks    Status New    Target Date 03/06/22                   Plan - 02/01/22 1209     Clinical Impression Statement Emphasis of manual therapy on L jaw line and lateral neck - site of Pt reproted neuropathic pain. L jaw angle is most painful area and mild, palpable swelling and fibrosis are palpable in this area. Provided L head and neck MLD clearing towards L axillary LN with slight reduction in swelling and slight analgesic effect after manualm therapy. Introduced the idea of shaving portion of goatee adjacent to neck       to use kinesiotape in this region. We'll discuss further next visit. Also introduced the idea of increasing Rx frequency from 1 to 2 x weekly, but Pt states he has too many medical appointments at present to add one more. We'll revisit this in a few weeks as hot outdoor weather increases and may exacerbate swelling. Discussed using a cooling towel   on neck when working or participating in leisure activities outdoors to limit swelling . Cont as per POC.    OT Occupational Profile and History Detailed Assessment- Review of Records and additional review of physical, cognitive, psychosocial history related to current functional performance    Occupational performance deficits (Please refer to evaluation for details): ADL's;IADL's;Rest and Sleep;Work;Leisure;Social Participation;Other   body image   Body Structure / Function / Physical Skills ADL;ROM;Scar mobility;IADL;Edema;Skin integrity;Improper spinal/pelvic alignment;Fascial restriction;Strength;Decreased knowledge of precautions;Pain;Decreased knowledge of use of DME;Flexibility    Rehab  Potential Good    Clinical Decision Making Several treatment options, min-mod task modification necessary    Comorbidities Affecting Occupational Performance: Presence of comorbidities impacting occupational performance    Comorbidities impacting occupational performance description: See SUBJECTIVE    Modification or Assistance to Complete Evaluation  Min-Moderate modification of tasks or assist with assess necessary to complete eval    OT Frequency 1x / week   2 x weekly recommended   but Pt requests 1 x only   OT Duration 12 weeks   2 x weekly recommended, but Pt requests 1 x only due increased burden of attending multiple, frequency medical appointments   OT Treatment/Interventions Self-care/ADL training;Therapeutic exercise;Manual Therapy;Coping strategies training;Therapeutic activities;Manual lymph drainage;DME and/or AE instruction;Compression bandaging;Other (comment);Scar mobilization;Patient/family education;Passive range of motion   skin care, kinesiotaping, myofascial release   Plan modified Intensive Phase Complete Decongestive Therapy (CDT: Manual lymphatic Drainage (MLD), compression, skin care, ther ex    OT Home Exercise Plan Set up trial in clinic with manufacturer's rep of Flexitouch, advanced, sequential pneumatic compression device specifically for head and neck  lymphatic decongestion.    Recommended Other Services [email or web address]    Consulted and Agree with Plan of Care Patient;Family member/caregiver             Patient will benefit from skilled therapeutic intervention in order to improve the following deficits and impairments:   Body Structure / Function / Physical Skills: ADL, ROM, Scar mobility, IADL, Edema, Skin integrity, Improper spinal/pelvic alignment, Fascial restriction, Strength, Decreased knowledge of precautions, Pain, Decreased knowledge of use of DME, Flexibility        Visit Diagnosis: Lymphedema, not elsewhere classified    Problem List Patient Active Problem List   Diagnosis Date Noted   Seasonal allergies 05/06/2021   Peripheral neuropathy, severe  03/16/2021   Uncontrolled type 2 diabetes mellitus 03/26/2020   Bilateral lower extremity edema 03/26/2020   History of non-ST elevation myocardial infarction (NSTEMI) 05/20/2019   Dyslipidemia 07/06/2018   GERD (gastroesophageal reflux disease) 07/05/2018   Obesity, unspecified 06/19/2017   Hypertension 12/01/2016   Methadone maintenance therapy patient (HCC) 02/24/2014   OSA (obstructive sleep apnea) 05/06/2013   Chronic back pain 12/25/2011    Luis Dubonnet, MS, OTR/L, [facility] 02/01/22 12:21 PM   [place] [facility] MAIN [facility] SERVICES [address] [place], [place], [address] Phone: [phone]   Fax:  [phone]  Name: Luis [name] MRN: [id number] Date of Birth: May 28, 1971

## 2022-02-03 ENCOUNTER — Encounter: Payer: Medicaid Other | Admitting: Occupational Therapy

## 2022-02-08 ENCOUNTER — Encounter: Payer: Medicaid Other | Admitting: Occupational Therapy

## 2022-02-10 ENCOUNTER — Encounter: Payer: Medicaid Other | Admitting: Occupational Therapy

## 2022-02-15 ENCOUNTER — Ambulatory Visit: Payer: Medicaid Other | Admitting: Occupational Therapy

## 2022-02-17 ENCOUNTER — Ambulatory Visit: Payer: Medicaid Other | Admitting: Occupational Therapy

## 2022-02-17 ENCOUNTER — Encounter: Payer: Medicaid Other | Admitting: Occupational Therapy

## 2022-02-17 DIAGNOSIS — I890 Lymphedema, not elsewhere classified: Secondary | ICD-10-CM | POA: Diagnosis not present

## 2022-02-17 NOTE — Therapy (Signed)
[facility] MAIN [facility] SERVICES [address] [place], [place], [address] Phone: [phone]   Fax:  [phone]  Occupational Therapy Treatment  Patient Details  Name: Luis [name] MRN: [id number] Date of Birth: 09/03/1970 Referring Provider (OT): Deidre Ala, MD   Encounter Date: 02/17/2022   OT End of Session - 02/17/22 0857     Visit Number 7    Number of Visits 36    Date for OT Re-Evaluation 03/06/22    OT Start Time 0900    OT Stop Time 1005    OT Time Calculation (min) 65 min    Activity Tolerance Patient tolerated treatment well;No increased pain    Behavior During Therapy [facility] for tasks assessed/performed             Past Medical History:  Diagnosis Date   ACS (acute coronary syndrome) (HCC) 05/20/2019   Back pain    Diabetes mellitus without complication (HCC)    Drug addiction in remission (HCC)    High cholesterol    Hypertension    Knee pain    Myocardial infarction (HCC) 05/2019   Neuropathy     Past Surgical History:  Procedure Laterality Date   HERNIA REPAIR     KNEE SURGERY Right    LEFT HEART CATH AND CORONARY ANGIOGRAPHY N/A 05/21/2019   Procedure: LEFT HEART CATH AND CORONARY ANGIOGRAPHY;  Surgeon: Marcina Millard, MD;  Location: [facility] INVASIVE CV LAB;  Service: Cardiovascular;  Laterality: N/A;    There were no vitals filed for this visit.   Subjective Assessment - 02/17/22 0858     Subjective  Luis [name] presents for OT Rx to address head and neck lymphedema 2/2 larnyx cancer. Pt was last seen on 02/01/22. Pt states he has been busy going to doctor's appointments. He states he is doing well and is relieved  after recent follow up scan  that there is NED in neck and chest. Pt states his 51th birthday is [birth year], so he and his family are going to the beach for 13 days. OT gave Pt a "cooling towel" for use on his neck at the beach to keep to keep skin temperater dfown and limit swelling in  hot summer weather.    Patient is accompanied by: --   device manufacturer's rep.   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 S/P total laryngectomy w B LND ( 6+/88). 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    Limitations altered  voice w/ prosthesis, difficulty swallowing, difficulty eating (teeth extracted, no dentures to date) chronic head/neck swelling and associated pain,  decreased cervical AROM, decreased L shoulder strength ( extension 4/5)    Special Tests Face and Neck measurements TBA Rx visit 2; Intake FOTO score 98/100    Patient Stated Goals reduce head/neck/facial swelling  and pain, and leep it from getting worse    Currently in Pain? No/denies    Pain Onset Other (comment)   s/p surgery                         OT Treatments/Exercises (OP) - 02/17/22 1336       ADLs   ADL Education Given Yes      Manual Therapy   Manual Therapy Edema management;Manual  Lymphatic Drainage (MLD);Soft tissue mobilization    Manual Lymphatic Drainage (MLD) MLD to bilateral head and neck utilizing deep biaphragmatic breathing  , bilateral in tact  axillary LN, clavicular     nodes. Provided scar massage to surgical site. No facial MLD this date.                    OT Education - 02/17/22 1337     Education Details Continued skilled Pt/caregiver education  And LE ADL training throughout visit for lymphedema self care/ home program, including compression wrapping, compression garment and device wear/care, lymphatic pumping ther ex, simple self-MLD, and skin care. Discussed initial volumetric measurements. Discussed all long term goals.    Person(s) Educated Patient    Methods Explanation;Demonstration;Handout;Verbal cues    Comprehension  Verbalized understanding;Returned demonstration                 OT Long Term Goals - 12/08/21 1303       OT LONG TERM GOAL #1   Title Given his functional score  of 98/100 on the FOTO outcomes measure, Pt will increase functional score by 2 Pts to 100% to improve basic and instrumental ADLs performance and QOL.    Baseline 98/100    Time 12    Period Weeks    Status New    Target Date 03/06/22      OT LONG TERM GOAL #2   Title Pt will verbalize understanding of lymphedema precautions and prevention strategies by identifying and discussing how s/he may implement five precautions into daily life using printed reference (modified assistance) to reduce risk of progression and to limit infection risk.    Baseline Max A    Time 4    Period Days    Status New    Target Date --   4th Rx visit     OT LONG TERM GOAL #3   Title Pt will achieve at least a 10% volume reductions to return head and neck to more typical size and shape, to limit infection risk and LE progression, to decrease pain, to improve function, and to improve body image and QOL.    Baseline TBA at Rx visit 1    Time 12    Period Weeks    Status New    Target Date 03/06/22      OT LONG TERM GOAL #4   Title Pt will achieve independent performance of,  and sustain no less than 85% compliance with all LE self-care home program components throughout CDT, including modified simple self-MLD, daily skin care and inspection,  lymphatic pumping the ex and appropriate compression to limit lymphedema progression and to limit further functional decline.    Baseline Max A    Time 12    Period Weeks    Status New    Target Date 03/06/22      OT LONG TERM GOAL #5   Title Pt will regain full neck AROM  in all planes, essential for optimal functional performance and safety.    Baseline Max A    Time 12    Period Weeks    Status New    Target Date 03/06/22      Long Term Additional Goals   Additional Long Term Goals Yes       OT LONG TERM GOAL #6   Title Pt will obtain proper compression garments/compression braces and be independent with donning/doffing to optimize/stabilize with fluid reduction in 6 visits.    Baseline  Max A    Time 6    Period Weeks    Status New    Target Date 12/22/20      OT LONG TERM GOAL #7   Title Pt will achieve reduction in pain and sensory discomfort in head and neck from initiall reported 8-9/ 10 to no more than 5/10 by end of Intensive to improve psychological coping, mood and QOL.    Baseline 8-9/10 (suspect neurogenic)    Time 12    Period Weeks    Status New    Target Date 03/06/22                   Plan - 02/17/22 1327     Clinical Impression Statement Reviewed entire simple self-MLD sequence , including demonstration and practice. Handout given. Good attempt to return but AROM limitred by shoulder issues, back pain and body habitus. Pt will benefit from Flexitouch device to effectively perform self MLD. We are awaiting news of insurance preauthorization. Pt demonstrates understanding of lymphatic map of his head and neck structures and pathways and understands importance of ongoing self MLD for controlling LE over time, Pt independently performed diaphragmatic breathing   using neck breathing technique. Provided MLD, simultaneous skin care, as established. Completed manual therapy with massage to surgical scar in sitting and supine. Good tolerance without increased pain. Retaught scar massage with good return. Cont as per POC.    OT Occupational Profile and History Detailed Assessment- Review of Records and additional review of physical, cognitive, psychosocial history related to current functional performance    Occupational performance deficits (Please refer to evaluation for details): ADL's;IADL's;Rest and Sleep;Work;Leisure;Social Participation;Other   body image   Body Structure / Function / Physical Skills ADL;ROM;Scar mobility;IADL;Edema;Skin integrity;Improper  spinal/pelvic alignment;Fascial restriction;Strength;Decreased knowledge of precautions;Pain;Decreased knowledge of use of DME;Flexibility    Rehab Potential Good    Clinical Decision Making Several treatment options, min-mod task modification necessary    Comorbidities Affecting Occupational Performance: Presence of comorbidities impacting occupational performance    Comorbidities impacting occupational performance description: See SUBJECTIVE    Modification or Assistance to Complete Evaluation  Min-Moderate modification of tasks or assist with assess necessary to complete eval    OT Frequency 1x / week   2 x weekly recommended   but Pt requests 1 x only   OT Duration 12 weeks   2 x weekly recommended, but Pt requests 1 x only due increased burden of attending multiple, frequency medical appointments   OT Treatment/Interventions Self-care/ADL training;Therapeutic exercise;Manual Therapy;Coping strategies training;Therapeutic activities;Manual lymph drainage;DME and/or AE instruction;Compression bandaging;Other (comment);Scar mobilization;Patient/family education;Passive range of motion   skin care, kinesiotaping, myofascial release   Plan modified Intensive Phase Complete Decongestive Therapy (CDT: Manual lymphatic Drainage (MLD), compression, skin care, ther ex    OT Home Exercise Plan Set up trial in clinic with manufacturer's rep of Flexitouch, advanced, sequential pneumatic compression device specifically for head and neck  lymphatic decongestion.    Recommended Other Services [email or web address]    Consulted and Agree with Plan of Care Patient;Family member/caregiver             Patient will benefit from skilled therapeutic intervention in order to improve the following deficits and impairments:   Body Structure / Function / Physical Skills: ADL, ROM, Scar mobility, IADL, Edema, Skin integrity, Improper  spinal/pelvic alignment, Fascial restriction, Strength, Decreased knowledge of precautions, Pain, Decreased knowledge of use of DME, Flexibility       Visit Diagnosis: Lymphedema, not elsewhere classified  Problem List Patient Active Problem List   Diagnosis Date Noted   Seasonal allergies 05/06/2021   Peripheral neuropathy, severe 03/16/2021   Uncontrolled type 2 diabetes mellitus 03/26/2020   Bilateral lower extremity edema 03/26/2020   History of non-ST elevation myocardial infarction (NSTEMI) 05/20/2019   Dyslipidemia 07/06/2018   GERD (gastroesophageal reflux disease) 07/05/2018   Obesity, unspecified 06/19/2017   Hypertension 12/01/2016   Methadone maintenance therapy patient (HCC) 02/24/2014   OSA (obstructive sleep apnea) 05/06/2013   Chronic back pain 12/25/2011    Loel Dubonnet, MS, OTR/L, CLT-[facility] 02/17/22 1:39 PM   [place] [facility] MAIN [facility] SERVICES [address] [place], [place], [address] Phone: [phone]   Fax:  [phone]  Name: Luis [name] MRN: [id number] Date of Birth: [birth year]

## 2022-02-24 ENCOUNTER — Encounter: Payer: Medicaid Other | Admitting: Occupational Therapy
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
## 2022-03-02 ENCOUNTER — Ambulatory Visit: Payer: Medicaid Other

## 2022-03-02 ENCOUNTER — Ambulatory Visit: Payer: Medicaid Other | Attending: Hematology and Oncology | Admitting: Occupational Therapy

## 2022-03-02 DIAGNOSIS — I890 Lymphedema, not elsewhere classified: Secondary | ICD-10-CM | POA: Insufficient documentation

## 2022-03-02 DIAGNOSIS — M5459 Other low back pain: Secondary | ICD-10-CM | POA: Diagnosis present

## 2022-03-02 DIAGNOSIS — R262 Difficulty in walking, not elsewhere classified: Secondary | ICD-10-CM

## 2022-03-02 DIAGNOSIS — M6281 Muscle weakness (generalized): Secondary | ICD-10-CM | POA: Insufficient documentation

## 2022-03-03 ENCOUNTER — Encounter: Payer: Self-pay | Admitting: Occupational Therapy

## 2022-03-03 NOTE — Therapy (Signed)
[facility] MAIN [facility] SERVICES [address] [place], [place], [address] Phone: [phone]   Fax:  [phone]  Occupational Therapy Treatment  Patient Details  Name: Luis [name] MRN: [id number] Date of Birth: 11/06/1970 Referring Provider (OT): Deidre Ala, MD   Encounter Date: 03/02/2022   OT End of Session - 03/02/22 1103     Visit Number 8    Number of Visits 36    Date for OT Re-Evaluation 03/06/22    OT Start Time 1103    OT Stop Time 1205    OT Time Calculation (min) 62 min    Activity Tolerance Patient tolerated treatment well;No increased pain    Behavior During Therapy [facility] for tasks assessed/performed             Past Medical History:  Diagnosis Date   ACS (acute coronary syndrome) (HCC) 05/20/2019   Back pain    Diabetes mellitus without complication (HCC)    Drug addiction in remission (HCC)    High cholesterol    Hypertension    Knee pain    Myocardial infarction (HCC) 05/2019   Neuropathy     Past Surgical History:  Procedure Laterality Date   HERNIA REPAIR     KNEE SURGERY Right    LEFT HEART CATH AND CORONARY ANGIOGRAPHY N/A 05/21/2019   Procedure: LEFT HEART CATH AND CORONARY ANGIOGRAPHY;  Surgeon: Marcina Millard, MD;  Location: [facility] INVASIVE CV LAB;  Service: Cardiovascular;  Laterality: N/A;    There were no vitals filed for this visit.   Subjective Assessment - 03/03/22 0939     Subjective  Luis [name] presents for OT Rx to address head and neck lymphedema 2/2 larnyx cancer. Pt was last seen on 02/17/22 before [place]. Pt stated that he enjoyed the trip , but "it was so hot". Pt states he has a PT eval after our session to address back pain he's had ongoing since his surgery. Pt expresses concern  about increased swelling around his R eye and cheeck today. Pt reports tightness and discomfort with lateral neck flexion bilaterally. He does not  rate pain numerically .    Patient is accompanied by: --   device manufacturer's rep.   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 S/P total laryngectomy w B LND ( 6+/88). 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    Limitations altered  voice w/ prosthesis, difficulty swallowing, difficulty eating (teeth extracted, no dentures to date) chronic head/neck swelling and associated pain,  decreased cervical AROM, decreased L shoulder strength ( extension 4/5)    Special Tests Face and Neck measurements TBA Rx visit 2; Intake FOTO score 98/100    Patient Stated Goals reduce head/neck/facial swelling  and pain, and leep it from getting worse    Currently in Pain? Yes    Pain Score --   not rated numerically   Pain Location Neck   cheek, neck   Pain Orientation Right;Left    Pain Descriptors / Indicators Pressure;Other (Comment);Discomfort;Tightness   fullness   Pain Type Acute pain    Pain Onset Other (comment)   s/p surgery   Pain Frequency Intermittent    Aggravating Factors  head flexion, head rotation  OT Treatments/Exercises (OP) - 03/03/22 0955       ADLs   ADL Education Given Yes      Manual Therapy   Manual Therapy Edema management;Manual Lymphatic Drainage (MLD);Taping    Edema Management kinesio tape bilaterally    Myofascial Release scar massage    Manual Lymphatic Drainage (MLD) MLD to bilateral head and neck utilizing deep biaphragmatic breathing  , bilateral in tact  axillary LN, clavicular     nodes. Provided scar massage to surgical site. No facial MLD this date.                    OT Education - 03/03/22 0943     Education Details Continued skilled Pt/caregiver education  And LE ADL training  throughout visit for lymphedema self care/ home program, including compression wrapping, compression garment and device wear/care, lymphatic pumping ther ex, simple self-MLD, and skin care. Reviewed scalene stretch    Person(s) Educated Patient    Methods Explanation;Demonstration;Handout;Verbal cues    Comprehension Verbalized understanding;Returned demonstration                 OT Long Term Goals - 12/08/21 1303       OT LONG TERM GOAL #1   Title Given his functional score  of 98/100 on the FOTO outcomes measure, Pt will increase functional score by 2 Pts to 100% to improve basic and instrumental ADLs performance and QOL.    Baseline 98/100    Time 12    Period Weeks    Status New    Target Date 03/06/22      OT LONG TERM GOAL #2   Title Pt will verbalize understanding of lymphedema precautions and prevention strategies by identifying and discussing how s/he may implement five precautions into daily life using printed reference (modified assistance) to reduce risk of progression and to limit infection risk.    Baseline Max A    Time 4    Period Days    Status New    Target Date --   4th Rx visit     OT LONG TERM GOAL #3   Title Pt will achieve at least a 10% volume reductions to return head and neck to more typical size and shape, to limit infection risk and LE progression, to decrease pain, to improve function, and to improve body image and QOL.    Baseline TBA at Rx visit 1    Time 12    Period Weeks    Status New    Target Date 03/06/22      OT LONG TERM GOAL #4   Title Pt will achieve independent performance of,  and sustain no less than 85% compliance with all LE self-care home program components throughout CDT, including modified simple self-MLD, daily skin care and inspection,  lymphatic pumping the ex and appropriate compression to limit lymphedema progression and to limit further functional decline.    Baseline Max A    Time 12    Period Weeks    Status New     Target Date 03/06/22      OT LONG TERM GOAL #5   Title Pt will regain full neck AROM  in all planes, essential for optimal functional performance and safety.    Baseline Max A    Time 12    Period Weeks    Status New    Target Date 03/06/22      Long Term Additional Goals   Additional Long Term  Goals Yes      OT LONG TERM GOAL #6   Title Pt will obtain proper compression garments/compression braces and be independent with donning/doffing to optimize/stabilize with fluid reduction in 6 visits.    Baseline Max A    Time 6    Period Weeks    Status New    Target Date 12/22/20      OT LONG TERM GOAL #7   Title Pt will achieve reduction in pain and sensory discomfort in head and neck from initiall reported 8-9/ 10 to no more than 5/10 by end of Intensive to improve psychological coping, mood and QOL.    Baseline 8-9/10 (suspect neurogenic)    Time 12    Period Weeks    Status New    Target Date 03/06/22                   Plan - 03/03/22 0951     Clinical Impression Statement Reviewed scalene stretch in effort to increase head/ neck AROM and reduce pain. Pt mastered ther ex. Provided MLD seated and supine , as established, utilizing adjacent axillary watershed. Emphasis of MLD today on R face. Provided scar mobilization bilaterally with good tolerance. Applied kinesio tape bilaterally, 2" wide x 4" long x 2 fan cuts anchored at clavicals extending across surgical scar. P managing well between visits. Cont as per POC 1 x weekly.    OT Occupational Profile and History Detailed Assessment- Review of Records and additional review of physical, cognitive, psychosocial history related to current functional performance    Occupational performance deficits (Please refer to evaluation for details): ADL's;IADL's;Rest and Sleep;Work;Leisure;Social Participation;Other   body image   Body Structure / Function / Physical Skills ADL;ROM;Scar mobility;IADL;Edema;Skin integrity;Improper  spinal/pelvic alignment;Fascial restriction;Strength;Decreased knowledge of precautions;Pain;Decreased knowledge of use of DME;Flexibility    Rehab Potential Good    Clinical Decision Making Several treatment options, min-mod task modification necessary    Comorbidities Affecting Occupational Performance: Presence of comorbidities impacting occupational performance    Comorbidities impacting occupational performance description: See SUBJECTIVE    Modification or Assistance to Complete Evaluation  Min-Moderate modification of tasks or assist with assess necessary to complete eval    OT Frequency 1x / week   2 x weekly recommended   but Pt requests 1 x only   OT Duration 12 weeks   2 x weekly recommended, but Pt requests 1 x only due increased burden of attending multiple, frequency medical appointments   OT Treatment/Interventions Self-care/ADL training;Therapeutic exercise;Manual Therapy;Coping strategies training;Therapeutic activities;Manual lymph drainage;DME and/or AE instruction;Compression bandaging;Other (comment);Scar mobilization;Patient/family education;Passive range of motion   skin care, kinesiotaping, myofascial release   Plan modified Intensive Phase Complete Decongestive Therapy (CDT: Manual lymphatic Drainage (MLD), compression, skin care, ther ex    OT Home Exercise Plan Set up trial in clinic with manufacturer's rep of Flexitouch, advanced, sequential pneumatic compression device specifically for head and neck  lymphatic decongestion.    Recommended Other Services [email or web address]    Consulted and Agree with Plan of Care Patient;Family member/caregiver             Patient will benefit from skilled therapeutic intervention in order to improve the following deficits and impairments:   Body Structure / Function / Physical Skills: ADL, ROM, Scar mobility, IADL, Edema, Skin integrity, Improper  spinal/pelvic alignment, Fascial restriction, Strength, Decreased knowledge of precautions, Pain, Decreased knowledge of use of DME, Flexibility       Visit Diagnosis: Lymphedema, not elsewhere classified  Problem List Patient Active Problem List   Diagnosis Date Noted   Seasonal allergies 05/06/2021   Peripheral neuropathy, severe 03/16/2021   Uncontrolled type 2 diabetes mellitus 03/26/2020   Bilateral lower extremity edema 03/26/2020   History of non-ST elevation myocardial infarction (NSTEMI) 05/20/2019   Dyslipidemia 07/06/2018   GERD (gastroesophageal reflux disease) 07/05/2018   Obesity, unspecified 06/19/2017   Hypertension 12/01/2016   Methadone maintenance therapy patient (HCC) 02/24/2014   OSA (obstructive sleep apnea) 05/06/2013   Chronic back pain 12/25/2011    Loel Dubonnet, MS, OTR/L, [facility] 03/03/22 9:57 AM   [facility] MAIN [facility] SERVICES [address] [place], [place], [address] Phone: [phone]   Fax:  [phone]  Name: Luis [name] MRN: [id number] Date of Birth: November 21, 1970

## 2022-03-07 ENCOUNTER — Ambulatory Visit: Payer: Medicaid Other

## 2022-03-09 ENCOUNTER — Ambulatory Visit: Payer: Medicaid Other

## 2022-03-09 ENCOUNTER — Ambulatory Visit: Payer: Medicaid Other | Admitting: Occupational Therapy

## 2022-03-10 ENCOUNTER — Ambulatory Visit: Payer: Medicaid Other | Admitting: Physical Therapy

## 2022-03-16 ENCOUNTER — Ambulatory Visit: Payer: Medicaid Other | Admitting: Occupational Therapy

## 2022-03-16 ENCOUNTER — Ambulatory Visit: Payer: Medicaid Other

## 2022-03-16 DIAGNOSIS — I890 Lymphedema, not elsewhere classified: Secondary | ICD-10-CM

## 2022-03-16 DIAGNOSIS — M6281 Muscle weakness (generalized): Secondary | ICD-10-CM

## 2022-03-16 DIAGNOSIS — M5459 Other low back pain: Secondary | ICD-10-CM
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
## 2022-03-16 NOTE — Therapy (Unsigned)
OUTPATIENT OCCUPATIONAL THERAPY TREATMENT NOTE   Patient Name: Luis [name] MRN: [id number] DOB:Jan 14, 1971, 51 y.o., male Today's Date: 03/17/2022  PCP: Judi Saa, NP REFERRING PROVIDER: Deidre Ala, MD    OT End of Session - 03/16/22 1115     Visit Number 9    Number of Visits 36    Date for OT Re-Evaluation 03/06/22    OT Start Time 1100    OT Stop Time 1212    OT Time Calculation (min) 72 min    Equipment Utilized During Treatment kinesiotape    Activity Tolerance Patient tolerated treatment well;No increased pain    Behavior During Therapy [facility] for tasks assessed/performed             Past Medical History:  Diagnosis Date   ACS (acute coronary syndrome) (HCC) 05/20/2019   Back pain    Diabetes mellitus without complication (HCC)    Drug addiction in remission (HCC)    High cholesterol    Hypertension    Knee pain    Myocardial infarction (HCC) 05/2019   Neuropathy    Past Surgical History:  Procedure Laterality Date   HERNIA REPAIR     KNEE SURGERY Right    LEFT HEART CATH AND CORONARY ANGIOGRAPHY N/A 05/21/2019   Procedure: LEFT HEART CATH AND CORONARY ANGIOGRAPHY;  Surgeon: Marcina Millard, MD;  Location: [facility];  Service: Cardiovascular;  Laterality: N/A;   Patient Active Problem List   Diagnosis Date Noted   Seasonal allergies 05/06/2021   Peripheral neuropathy, severe 03/16/2021   Uncontrolled type 2 diabetes mellitus 03/26/2020   Bilateral lower extremity edema 03/26/2020   History of non-ST elevation myocardial infarction (NSTEMI) 05/20/2019   Dyslipidemia 07/06/2018   GERD (gastroesophageal reflux disease) 07/05/2018   Obesity, unspecified 06/19/2017   Hypertension 12/01/2016   Methadone maintenance therapy patient (HCC) 02/24/2014   OSA (obstructive sleep apnea) 05/06/2013   Chronic back pain 12/25/2011    ONSET DATE: 12/06/21  REFERRING DIAG: I89.0   THERAPY DIAG:  Lymphedema, not elsewhere  classified  Rationale for Evaluation and Treatment Rehabilitation  PERTINENT HISTORY: 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 S/P total laryngectomy w B LND ( 6+/88). HTN, DM, CAD, obesity, Hx MI 2021, periferal neuropathy, chronic face and neck swelling and pain, neuropathy, OSA, drug addiction (remission-on Methadone maintenenance therapy), Hx tobacco abuse.   LIMITATIONS: unable to speak ( no voice prosthesis yet), difficulty swallowing, difficulty eating (teeth extracted, no dentures yet) chronic head/neck swelling and associated pain,  decreased cervical AROM, decreased L shoulder extension 4/5, impaired body image, mood swings, increased palpable tissue fibrosis  PRECAUTIONS: Lymphedema precautions  SUBJECTIVE:   Subjective Assessment - 03/17/22 1501     Subjective  Luis [name] presents for OT Rx to address head and neck lymphedema 2/2 larnyx cancer. Pt was last seen on 03/02/22. He told me he had a stomach visrus duriing the interval. Pt tells me he has been doing exercises learned for swallow strengthening, lymphatic pumping, active assisted scalene stretches and simple self MLD at home. Pt expresses concern again today about limiting facial swelling.    Patient is accompanied by: --   device manufacturer's rep.   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 S/P total laryngectomy w B LND ( 6+/88). HTN, DM, CAD, obesity, Hx MI 2021, periferal neuropathy, chronic face and neck swelling and pain, neuropathy,  OSA, drug addiction (remission on Methadone maintenenance therapy), Hx tobacco abuse.    Limitations altered  voice w/ prosthesis, difficulty swallowing, difficulty eating (teeth extracted, no dentures to date) chronic head/neck swelling and associated pain,  decreased cervical AROM, decreased L shoulder strength ( extension 4/5)    Special  Tests Face and Neck measurements TBA Rx visit 2; Intake FOTO score 98/100    Patient Stated Goals reduce head/neck/facial swelling  and pain, and leep it from getting worse    Pain Onset Other (comment)   s/p surgery   Pain Onset More than a month ago              PAIN:  Are you having pain? Yes: NPRS scale: 5/10 Pain location: R jaw line Pain description: aching, sore Aggravating factors:   Relieving factors: medication     OBJECTIVE:   TODAY'S TREATMENT:  MLD to B head and neck utilizing functional axillary watersheds while seated EOB. 2. Scar massage and gentle MFR at site of surgical scar and SCM insertions. 3. Kinesiotape anchored at anterior chest adjoining axillae and 3 finger cuts extending proximally across surgical scar. Third piece of KT anchored at preauricular LN and 3 fingers extended along jawline.     PATIENT EDUCATION: Education details: Pt edu for n=benefits of kinesiotaping tpo mobilize scar and to facilitate improved lymphatic function with passisve skin lifting and skin stretch towards closest functional lymphatic watershed. Person educated: Patient Education method: Explanation, Demonstration, Tactile cues, and Verbal cues Education comprehension: verbalized understanding, returned demonstration, verbal cues required, tactile cues required, and needs further education   HOME EXERCISE PROGRAM Swallow strengthening therex Lymphatic pumping therex Active assisted scalene stretches Simple self MLD     OT Long Term Goals - 12/08/21 1303       OT LONG TERM GOAL #1   Title Given his functional score  of 98/100 on the FOTO outcomes measure, Pt will increase functional score by 2 Pts to 100% to improve basic and instrumental ADLs performance and QOL.    Baseline 98/100    Time 12    Period Weeks    Status New    Target Date 03/06/22      OT LONG TERM GOAL #2   Title Pt will verbalize understanding of lymphedema precautions and prevention  strategies by identifying and discussing how s/he may implement five precautions into daily life using printed reference (modified assistance) to reduce risk of progression and to limit infection risk.    Baseline Max A    Time 4    Period Days    Status New    Target Date --   4th Rx visit     OT LONG TERM GOAL #3   Title Pt will achieve at least a 10% volume reductions to return head and neck to more typical size and shape, to limit infection risk and LE progression, to decrease pain, to improve function, and to improve body image and QOL.    Baseline TBA at Rx visit 1    Time 12    Period Weeks    Status New    Target Date 03/06/22      OT LONG TERM GOAL #4   Title Pt will achieve independent performance of,  and sustain no less than 85% compliance with all LE self-care home program components throughout CDT, including modified simple self-MLD, daily skin care and inspection,  lymphatic pumping the ex and appropriate compression to limit lymphedema progression and to limit further  functional decline.    Baseline Max A    Time 12    Period Weeks    Status New    Target Date 03/06/22      OT LONG TERM GOAL #5   Title Pt will regain full neck AROM  in all planes, essential for optimal functional performance and safety.    Baseline Max A    Time 12    Period Weeks    Status New    Target Date 03/06/22      Long Term Additional Goals   Additional Long Term Goals Yes      OT LONG TERM GOAL #6   Title Pt will obtain proper compression garments/compression braces and be independent with donning/doffing to optimize/stabilize with fluid reduction in 6 visits.    Baseline Max A    Time 6    Period Weeks    Status New    Target Date 12/22/20      OT LONG TERM GOAL #7   Title Pt will achieve reduction in pain and sensory discomfort in head and neck from initiall reported 8-9/ 10 to no more than 5/10 by end of Intensive to improve psychological coping, mood and QOL.    Baseline  8-9/10 (suspect neurogenic)    Time 12    Period Weeks    Status New    Target Date 03/06/22              Plan - 03/16/22 1555     Clinical Impression Statement Facial swelling is less pronounced today than when last seen so focused attention today on MLD and scar massage. Pt tolerated without increased pain. Reapplied kinesiotape  to facilitate scar mobilization. Also added a 2" wide x 4 " long strip of tape  along jaw line in hopes of reducing  discomfort. Reviewed Pt edu for simple self MLD. Next visit will review face Rx.  Applied kinesiotaope again today anchored at anterior chest adjoining axillae and 3 finger cuts extending proximally across surgical scar. Third piece of KT anchored at preauricular LN and 3 fingers extended along jawline.    OT Occupational Profile and History Detailed Assessment- Review of Records and additional review of physical, cognitive, psychosocial history related to current functional performance    Occupational performance deficits (Please refer to evaluation for details): ADL's;IADL's;Rest and Sleep;Work;Leisure;Social Participation;Other   body image   Body Structure / Function / Physical Skills ADL;ROM;Scar mobility;IADL;Edema;Skin integrity;Improper spinal/pelvic alignment;Fascial restriction;Strength;Decreased knowledge of precautions;Pain;Decreased knowledge of use of DME;Flexibility    Rehab Potential Good    Clinical Decision Making Several treatment options, min-mod task modification necessary    Comorbidities Affecting Occupational Performance: Presence of comorbidities impacting occupational performance    Comorbidities impacting occupational performance description: See SUBJECTIVE    Modification or Assistance to Complete Evaluation  Min-Moderate modification of tasks or assist with assess necessary to complete eval    OT Frequency 1x / week   2 x weekly recommended   but Pt requests 1 x only   OT Duration 12 weeks   2 x weekly recommended, but Pt  requests 1 x only due increased burden of attending multiple, frequency medical appointments   OT Treatment/Interventions Self-care/ADL training;Therapeutic exercise;Manual Therapy;Coping strategies training;Therapeutic activities;Manual lymph drainage;DME and/or AE instruction;Compression bandaging;Other (comment);Scar mobilization;Patient/family education;Passive range of motion   skin care, kinesiotaping, myofascial release   Plan modified Intensive Phase Complete Decongestive Therapy (CDT: Manual lymphatic Drainage (MLD), compression, skin care, ther ex    OT Home Exercise  Plan Set up trial in clinic with manufacturer's rep of Flexitouch, advanced, sequential pneumatic compression device specifically for head and neck  lymphatic decongestion.    Recommended Other Services [email or web address]    Consulted and Agree with Plan of Care Patient;Family member/caregiver              Loel Dubonnet, MS, OTR/L, CLT-LANA 03/17/22 3:02 PM

## 2022-03-21 ENCOUNTER — Ambulatory Visit: Payer: Medicaid Other

## 2022-03-23 ENCOUNTER — Ambulatory Visit: Payer: Medicaid Other | Attending: Gerontology | Admitting: Occupational Therapy

## 2022-03-23 DIAGNOSIS — I890 Lymphedema, not elsewhere classified: Secondary | ICD-10-CM | POA: Insufficient documentation

## 2022-03-23 NOTE — Therapy (Signed)
OUTPATIENT OCCUPATIONAL THERAPY TREATMENT NOTE & PROGRESS REPORT: HEAD & NECK LYMPHEDEMA CARE   Patient Name: Luis [name] MRN: [id number] DOB:1971-01-19, 51 y.o., male Today's Date: 03/23/2022 Reporting Period:  12/20/21 - 03/23/22  PCP: Filiberto Pinks, NP REFERRING PROVIDER: Carmin Richmond, MD    OT End of Session - 03/23/22 1100     Visit Number 10    Number of Visits 36    Date for OT Re-Evaluation 03/06/22    OT Start Time 1100    OT Stop Time 1207    OT Time Calculation (min) 67 min    Equipment Utilized During Treatment kinesiotape    Activity Tolerance Patient tolerated treatment well;No increased pain    Behavior During Therapy [facility] for tasks assessed/performed             Past Medical History:  Diagnosis Date   ACS (acute coronary syndrome) ([place]) 05/20/2019   Back pain    Diabetes mellitus without complication ([place])    Drug addiction in remission ([place])    High cholesterol    Hypertension    Knee pain    Myocardial infarction ([place]) 05/2019   Neuropathy    Past Surgical History:  Procedure Laterality Date   HERNIA REPAIR     KNEE SURGERY Right    LEFT HEART CATH AND CORONARY ANGIOGRAPHY N/A 05/21/2019   Procedure: LEFT HEART CATH AND CORONARY ANGIOGRAPHY;  Surgeon: Isaias Cowman, MD;  Location: [place] [facility];  Service: Cardiovascular;  Laterality: N/A;   Patient Active Problem List   Diagnosis Date Noted   Seasonal allergies 05/06/2021   Peripheral neuropathy, severe 03/16/2021   Uncontrolled type 2 diabetes mellitus 03/26/2020   Bilateral lower extremity edema 03/26/2020   History of non-ST elevation myocardial infarction (NSTEMI) 05/20/2019   Dyslipidemia 07/06/2018   GERD (gastroesophageal reflux disease) 07/05/2018   Obesity, unspecified 06/19/2017   Hypertension 12/01/2016   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 S/P total laryngectomy w B LND ( 6+/88). HTN, DM, CAD, obesity, Hx MI 2021, periferal neuropathy, chronic face and neck swelling and pain, neuropathy, OSA, drug addiction (remission-on Methadone maintenenance therapy), Hx tobacco abuse.   LIMITATIONS: unable to speak ( no voice prosthesis yet), difficulty swallowing, difficulty eating (teeth extracted, no dentures yet) chronic head/neck swelling and associated pain,  decreased cervical AROM, decreased L shoulder extension 4/5, impaired body image, mood swings, increased palpable tissue fibrosis  PRECAUTIONS: Lymphedema precautions  SUBJECTIVE: Luis [name] presents to OT to address head and neck lymphedema 2/2 cancer treatment., Pt reports he performs facial and neck simple self MLD every day while watching TV. Pt is agreeable to completing comparative limb volumetrics to measure progress towards OT goals today.     PAIN:  Are you having pain? Yes: NPRS scale: 5/10 Pain location: R jaw line Pain description: aching, sore Aggravating factors:   Relieving factors: medication     OBJECTIVE:   TODAY'S TREATMENT:  Bilateral head , neck and face measurements using composite technique 2. Kinesiotape anchored at anterior chest adjoining axillae and 3 finger cuts extending proximally across surgical scar. Third piece of KT anchored at preauricular LN and 3 fingers  extended along jawline.   Comparative Limb Volumetrics:   Date Rx Visit No. R Facial Composite R % change  L Facial composite L % change Mouth Opening M O Change Neck Composite % Change  Intake 12/20/21 I 1 99 cm N/A 92 cm N/A 3.0 cm N/A 157 cm N/A  03/23/22 10 85 cm DECREASED 14.1% 85 cm DECREASED  7.60% 3.0 cm No  change 146 cm DECREASED 0%                                       PATIENT EDUCATION: Education details: Reviewed progress towards goals with Pt Person educated: Patient Education method: Explanation, Demonstration, Tactile cues, and Verbal cues Education comprehension: demonstrated understanding. Asked pertinent questions. All questions answored to Pt's satisfaction   HOME EXERCISE PROGRAM Swallow strengthening therex Lymphatic pumping therex Active assisted scalene stretches Simple self MLD     OT Long Term Goals - 12/08/21 1303       OT LONG TERM GOAL #1   Title Given his functional score  of 98/100 on the FOTO outcomes measure, Pt will increase functional score by 2 Pts to 100% to improve basic and instrumental ADLs performance and QOL.    Baseline 98/100    Time 12    Period Weeks    Status Ongoing 03/23/22   Target Date 06/21/22     OT LONG TERM GOAL #2   Title Pt will verbalize understanding of lymphedema precautions and prevention strategies by identifying and discussing how s/he may implement five precautions into daily life using printed reference (modified assistance) to reduce risk of progression and to limit infection risk.    Baseline Max A    Time 4    Period Days    Status Achieved   Target Date      OT LONG TERM GOAL #3   Title Pt will achieve at least a 10% volume reductions to return head and neck to more typical size and shape, to limit infection risk and LE progression, to decrease pain, to improve function, and to improve body image and QOL.    Baseline TBA at Rx visit 1    Time 12    Period Weeks    Status Partially met. Excellent progress to date! See volumetric chart above.   Target Date 03/06/22      OT LONG TERM GOAL #4   Title Pt will achieve independent performance of,  and sustain no less than 85% compliance with all LE self-care home program components throughout CDT, including modified simple self-MLD, daily skin care and inspection,   lymphatic pumping the ex and appropriate compression to limit lymphedema progression and to limit further functional decline.    Baseline Max A    Time 12    Period Weeks    Status Achieved 03/23/22   Target Date      OT LONG TERM GOAL #5   Title Pt will regain full neck AROM  in all planes, essential for optimal functional performance and safety.    Baseline Max A    Time 12    Period Weeks    Status Partially met 03/23/22: Excellent progress to date.   Target Date 06/21/22     Long Term Additional Goals   Additional Long Term Goals Yes      OT LONG TERM GOAL #6   Title Pt will obtain proper compression garments/compression braces  and be independent with donning/doffing to optimize/stabilize with fluid reduction in 6 visits.    Baseline Max A    Time 6    Period Weeks    Status Ongoing 03/23/22: Flexitouch advanced sequential compression device under Medicaid review   Target Date 06/21/22     OT LONG TERM GOAL #7   Title Pt will achieve reduction in pain and sensory discomfort in head and neck from initiall reported 8-9/ 10 to no more than 5/10 by end of Intensive to improve psychological coping, mood and QOL.    Baseline 8-9/10 (suspect neurogenic)    Time 12    Period Weeks    Status Partially met 03/23/22 5/10 at R jaw line   Target Date 06/21/22             Plan - 03/16/22 1555     Clinical Impression Statement Mr. Towell demonstrates excellent progress to date (10th visit). All composite scores for head, neck and face are decreased, several within 10% goal range. Pt is more confident with his appearance and and less preoccupied with worries that his face will become disfigured with lymphedema. Neck AROM is improved in all planes. Pt remains 100% compliant with all home program exercises, stretches and self MLD. Pain level is reduced to 5/10, but not consistently. We're hopeful that the Flexitouch advanced sequential compression device ([employer]) will assist with pain  and swelling reduction. Pt completed a trial in the clinic and Medicaid is revieing aur recommendations. Mr Mccamish will continue to benefit from OT 1 x weekly for lymphedema care to limit progression and improve functional performance in all occupational domains.Next visit we'll continue with scar massage and teach Pt to use small handhelfd massager.  Pt OT Occupational Profile and History Detailed Assessment- Review of Records and additional review of physical, cognitive, psychosocial history related to current functional performance    Occupational performance deficits (Please refer to evaluation for details): ADL's;IADL's;Rest and Sleep;Work;Leisure;Social Participation;Other   body image   Body Structure / Function / Physical Skills ADL;ROM;Scar mobility;IADL;Edema;Skin integrity;Improper spinal/pelvic alignment;Fascial restriction;Strength;Decreased knowledge of precautions;Pain;Decreased knowledge of use of DME;Flexibility    Rehab Potential Good    Clinical Decision Making Several treatment options, min-mod task modification necessary    Comorbidities Affecting Occupational Performance: Presence of comorbidities impacting occupational performance    Comorbidities impacting occupational performance description: See SUBJECTIVE    Modification or Assistance to Complete Evaluation  Min-Moderate modification of tasks or assist with assess necessary to complete eval    OT Frequency 1x / week   2 x weekly recommended   but Pt requests 1 x only   OT Duration 12 weeks   2 x weekly recommended, but Pt requests 1 x only due increased burden of attending multiple, frequency medical appointments   OT Treatment/Interventions Self-care/ADL training;Therapeutic exercise;Manual Therapy;Coping strategies training;Therapeutic activities;Manual lymph drainage;DME and/or AE instruction;Compression bandaging;Other (comment);Scar mobilization;Patient/family education;Passive range of motion   skin care, kinesiotaping,  myofascial release   Plan modified Intensive Phase Complete Decongestive Therapy (CDT: Manual lymphatic Drainage (MLD), compression, skin care, ther ex    OT Home Exercise Plan Set up trial in clinic with manufacturer's rep of Flexitouch, advanced, sequential pneumatic compression device specifically for head and neck  lymphatic decongestion.    Recommended Other Services [email or web address]    Consulted and Agree with Plan of Care Patient;Family member/caregiver             Andrey Spearman, MS, OTR/L, CLT-LANA 03/23/22 1:37 PM

## 2022-03-24 ENCOUNTER — Ambulatory Visit: Payer: Medicaid Other

## 2022-03-28 ENCOUNTER — Ambulatory Visit: Payer: Medicaid Other

## 2022-03-29 ENCOUNTER — Telehealth: Payer: Self-pay

## 2022-03-29 NOTE — Telephone Encounter (Signed)
PT tried to contact pt via number pt provided over secure phone line due to recent missed visit. Pt mother answered, reported pt not currently available but will call back.  Temple Pacini PT, DPT

## 2022-03-30 ENCOUNTER — Ambulatory Visit: Payer: Medicaid Other | Admitting: Occupational Therapy

## 2022-03-31 ENCOUNTER — Ambulatory Visit: Payer: Medicaid Other

## 2022-04-04 ENCOUNTER — Ambulatory Visit: Payer: Medicaid Other

## 2022-04-06 ENCOUNTER — Ambulatory Visit: Payer: Medicaid Other | Admitting: Occupational Therapy

## 2022-04-11 ENCOUNTER — Ambulatory Visit: Payer: Medicaid Other

## 2022-04-13 ENCOUNTER — Ambulatory Visit: Payer: Medicaid Other | Admitting: Occupational Therapy

## 2022-04-13 ENCOUNTER — Ambulatory Visit: Payer: Medicaid Other

## 2022-04-14 ENCOUNTER — Ambulatory Visit: Payer: Medicaid Other

## 2022-04-18 ENCOUNTER — Ambulatory Visit: Payer: Medicaid Other

## 2022-04-20 ENCOUNTER — Ambulatory Visit: Payer: Medicaid Other | Admitting: Occupational Therapy

## 2022-04-20 DIAGNOSIS — I890 Lymphedema, not elsewhere classified: Secondary | ICD-10-CM

## 2022-04-21 ENCOUNTER — Ambulatory Visit: Payer: Medicaid Other | Admitting: Physical Therapy

## 2022-04-21 NOTE — Therapy (Signed)
OUTPATIENT OCCUPATIONAL THERAPY TREATMENT NOTE:  [place] [facility]   Patient Name: Luis [name] MRN: [id number] DOB:[birth year], 51 y.o., male Today's Date: 04/21/2022 Reporting Period:  12/20/21 - 03/23/22  PCP: Filiberto Pinks, NP REFERRING PROVIDER: Carmin Richmond, MD    OT End of Session - 04/20/22 1024     Visit Number 11    Number of Visits 36    Date for OT Re-Evaluation 06/21/22    Authorization Type 12 additional visits added commencing 04/20/22    Authorization - Visit Number 1    Authorization - Number of Visits 12    OT Start Time 7116    OT Stop Time 1127    OT Time Calculation (min) 72 min    Equipment Utilized During Treatment kinesiotape    Activity Tolerance Patient tolerated treatment well;No increased pain    Behavior During Therapy [facility] for tasks assessed/performed             Past Medical History:  Diagnosis Date   ACS (acute coronary syndrome) ([place]) 05/20/2019   Back pain    Diabetes mellitus without complication ([place])    Drug addiction in remission ([place])    High cholesterol    Hypertension    Knee pain    Myocardial infarction ([place]) 05/2019   Neuropathy    Past Surgical History:  Procedure Laterality Date   HERNIA REPAIR     KNEE SURGERY Right    LEFT HEART CATH AND CORONARY ANGIOGRAPHY N/A 05/21/2019   Procedure: LEFT HEART CATH AND CORONARY ANGIOGRAPHY;  Surgeon: Isaias Cowman, MD;  Location: [place] [facility];  Service: Cardiovascular;  Laterality: N/A;   Patient Active Problem List   Diagnosis Date Noted   Seasonal allergies 05/06/2021   Peripheral neuropathy, severe 03/16/2021   Uncontrolled type 2 diabetes mellitus 03/26/2020   Bilateral lower extremity edema 03/26/2020   History of non-ST elevation myocardial infarction (NSTEMI) 05/20/2019   Dyslipidemia 07/06/2018   GERD (gastroesophageal reflux disease) 07/05/2018   Obesity, unspecified 06/19/2017   Hypertension 12/01/2016   Methadone maintenance  therapy patient ([place]) 02/24/2014   OSA (obstructive sleep apnea) 05/06/2013   Chronic back pain 12/25/2011    ONSET DATE: 12/06/21  REFERRING DIAG: I89.0   THERAPY DIAG:  Lymphedema, not elsewhere classified  Rationale for Evaluation and Treatment Rehabilitation  PERTINENT HISTORY: 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 S/P total laryngectomy w B LND ( 6+/88). HTN, DM, CAD, obesity, Hx MI 2021, periferal neuropathy, chronic face and neck swelling and pain, neuropathy, OSA, drug addiction (remission-on Methadone maintenenance therapy), Hx tobacco abuse.   LIMITATIONS: difficulty speaking,  difficulty swallowing, difficulty eating (teeth extracted, no dentures yet) chronic head/neck swelling and associated pain,  decreased cervical AROM, decreased L shoulder extension 4/5, impaired body image, mood swings, increased palpable tissue fibrosis, decreased AROM head/neck  PRECAUTIONS: Lymphedema precautions  SUBJECTIVE: Luis [name] presents to OT to address head and neck lymphedema 2/2 cancer treatment. Pt last seen     PAIN:  Are you having pain? Yes: NPRS scale: 5/10 Pain location: R jaw line Pain description: aching, sore Aggravating factors:   Relieving factors: medication     OBJECTIVE:   TODAY'S TREATMENT:  Manual therapy, including MLD, myofacial release, scar massage 2. Kinesiotape anchored at anterior chest adjoining axillae and 3 finger cuts extending proximally across surgical scar. Third piece of KT anchored at preauricular LN and 3 fingers extended along jawline.  Comparative Limb Volumetrics:   Date Rx Visit No. R Facial Composite R % change  L Facial composite L % change Mouth Opening M O Change Neck Composite % Change  Intake 12/20/21 I 1 99 cm N/A 92 cm N/A 3.0 cm N/A 157 cm N/A  03/23/22 10 85 cm DECREASED 14.1% 85 cm DECREASED  7.60% 3.0 cm No change 146 cm DECREASED 0%                                        PATIENT EDUCATION: Education details: Reviewed progress towards goals. Continued Pt/ CG edu for lymphedema self care home program throughout session. Topics include outcome of comparative limb volumetrics- starting limb volume differentials (LVDs), technology and gradient techniques used for short stretch, multilayer compression wrapping, simple self-MLD, therapeutic lymphatic pumping exercises, skin/nail care, LE precautions,. compression garment recommendations and specifications, wear and care schedule and compression garment donning / doffing w assistive devices. Discussed progress towards all OT goals since commencing CDT. All questions answered to the Pt's satisfaction. Good return.  Person educated: Patient Education method: Explanation, Demonstration, Tactile cues, and Verbal cues Education comprehension: demonstrated understanding. Asked pertinent questions. All questions answored to Pt's satisfaction   HOME EXERCISE PROGRAM Swallow strengthening therex Lymphatic pumping therex Active assisted scalene stretches Simple self MLD Skin care     OT Long Term Goals - 12/08/21 1303       OT LONG TERM GOAL #1   Title Given his functional score  of 98/100 on the FOTO outcomes measure, Pt will increase functional score by 2 Pts to 100% to improve basic and instrumental ADLs performance and QOL.    Baseline 98/100    Time 12    Period Weeks    Status Ongoing 03/23/22   Target Date 06/21/22     OT LONG TERM GOAL #2   Title Pt will verbalize understanding of lymphedema precautions and prevention strategies by identifying and discussing how s/he may implement five precautions into daily life using printed reference (modified assistance) to reduce risk of progression and to limit infection risk.    Baseline Max A    Time 4    Period Days    Status Achieved   Target Date      OT LONG TERM GOAL #3   Title Pt will achieve at least a 10% volume reductions to return head  and neck to more typical size and shape, to limit infection risk and LE progression, to decrease pain, to improve function, and to improve body image and QOL.    Baseline TBA at Rx visit 1    Time 12    Period Weeks    Status Partially met. Excellent progress to date! See volumetric chart above.   Target Date 03/06/22      OT LONG TERM GOAL #4   Title Pt will achieve independent performance of,  and sustain no less than 85% compliance with all LE self-care home program components throughout CDT, including modified simple self-MLD, daily skin care and inspection,  lymphatic pumping the ex and appropriate compression to limit lymphedema progression and to limit further functional decline.    Baseline Max A    Time 12    Period Weeks    Status Achieved 03/23/22   Target Date      OT LONG TERM GOAL #5   Title Pt will regain full neck AROM  in all planes, essential for optimal functional performance and safety.    Baseline Max A    Time 12    Period Weeks    Status Partially met 03/23/22: Excellent progress to date.   Target Date 06/21/22     Long Term Additional Goals   Additional Long Term Goals Yes      OT LONG TERM GOAL #6   Title Pt will obtain proper compression garments/compression braces and be independent with donning/doffing to optimize/stabilize with fluid reduction in 6 visits.    Baseline Max A    Time 6    Period Weeks    Status Ongoing 03/23/22: Flexitouch advanced sequential compression device under Medicaid review   Target Date 06/21/22     OT LONG TERM GOAL #7   Title Pt will achieve reduction in pain and sensory discomfort in head and neck from initiall reported 8-9/ 10 to no more than 5/10 by end of Intensive to improve psychological coping, mood and QOL.    Baseline 8-9/10 (suspect neurogenic)    Time 12    Period Weeks    Status Partially met 03/23/22 5/10 at R jaw line   Target Date 06/21/22             Plan - 03/16/22 1555     Clinical Impression Statement  Mr. Vowels returns to OT today after missing several visits due to insurance issues. He performed all LE self care home program components daily, by report. He presents today with increased tightness in the R sternocleidomastoid and trapezius muscle. Manual therapy focused on scar massage , myofascial release and lastly MLD. Minimal   reduction in tightness noted after manual Rx. Applied kinesiotape across scar as established. Pt tolerated all interventions today without increased pain. Continue weekly as per POC.  Pt OT Occupational Profile and History Detailed Assessment- Review of Records and additional review of physical, cognitive, psychosocial history related to current functional performance    Occupational performance deficits (Please refer to evaluation for details): ADL's;IADL's;Rest and Sleep;Work;Leisure;Social Participation;Other   body image   Body Structure / Function / Physical Skills ADL;ROM;Scar mobility;IADL;Edema;Skin integrity;Improper spinal/pelvic alignment;Fascial restriction;Strength;Decreased knowledge of precautions;Pain;Decreased knowledge of use of DME;Flexibility    Rehab Potential Good    Clinical Decision Making Several treatment options, min-mod task modification necessary    Comorbidities Affecting Occupational Performance: Presence of comorbidities impacting occupational performance    Comorbidities impacting occupational performance description: See SUBJECTIVE    Modification or Assistance to Complete Evaluation  Min-Moderate modification of tasks or assist with assess necessary to complete eval    OT Frequency 1x / week   2 x weekly recommended   but Pt requests 1 x only   OT Duration 12 weeks   2 x weekly recommended, but Pt requests 1 x only due increased burden of attending multiple, frequency medical appointments   OT Treatment/Interventions Self-care/ADL training;Therapeutic exercise;Manual Therapy;Coping strategies training;Therapeutic activities;Manual lymph  drainage;DME and/or AE instruction;Compression bandaging;Other (comment);Scar mobilization;Patient/family education;Passive range of motion   skin care, kinesiotaping, myofascial release   Plan modified Intensive Phase Complete Decongestive Therapy (CDT: Manual lymphatic Drainage (MLD), compression, skin care, ther ex    OT Home Exercise Plan Set up trial in clinic with manufacturer's rep of Flexitouch, advanced, sequential pneumatic compression device specifically for head and neck  lymphatic decongestion.    Recommended Other Services [email or web address]    Consulted and Agree with Plan of Care Patient;Family member/caregiver  Andrey Spearman, MS, OTR/L, CLT-LANA 04/21/22 8:32 AM

## 2022-04-27 ENCOUNTER — Ambulatory Visit: Payer: Medicaid Other | Admitting: Occupational Therapy

## 2022-04-28 ENCOUNTER — Ambulatory Visit: Payer: Medicaid Other

## 2022-05-02 ENCOUNTER — Ambulatory Visit: Payer: Medicaid Other

## 2022-05-04 ENCOUNTER — Ambulatory Visit: Payer: Medicaid Other | Admitting: Occupational Therapy

## 2022-05-05 ENCOUNTER — Ambulatory Visit: Payer: Medicaid Other

## 2022-05-09 ENCOUNTER — Ambulatory Visit: Payer: Medicaid Other

## 2022-05-11 ENCOUNTER — Ambulatory Visit: Payer: Medicaid Other | Attending: Gerontology | Admitting: Occupational Therapy

## 2022-05-11 ENCOUNTER — Ambulatory Visit: Payer: Medicaid Other | Admitting: Occupational Therapy

## 2022-05-11 DIAGNOSIS — I890 Lymphedema, not elsewhere classified: Secondary | ICD-10-CM | POA: Insufficient documentation

## 2022-05-11 NOTE — Therapy (Signed)
[name] : [facility] MAIN [facility] SERVICES [address] [place], [place], [address] Phone: [phone]   Fax:  [phone]  Occupational Therapy Treatment  Patient Details  Name: Luis [name] MRN: [id number] Date of Birth: Feb 12, 1971 Referring Provider (OT): Deidre Ala, MD   Encounter Date: 05/11/2022   OT End of Session - 05/11/22 0808     Visit Number 12    Number of Visits 36    Date for OT Re-Evaluation 06/21/22    Authorization Type 12 additional visits added commencing 04/20/22    Authorization - Visit Number 2   Authorization - Number of Visits 12    OT Start Time 0800    OT Stop Time 0905    OT Time Calculation (min) 65 min    Equipment Utilized During Treatment kinesiotape    Activity Tolerance Patient tolerated treatment well;No increased pain    Behavior During Therapy [name] for tasks assessed/performed             Past Medical History:  Diagnosis Date   ACS (acute coronary syndrome) (HCC) 05/20/2019   Back pain    Diabetes mellitus without complication (HCC)    Drug addiction in remission (HCC)    High cholesterol    Hypertension    Knee pain    Myocardial infarction (HCC) 05/2019   Neuropathy     Past Surgical History:  Procedure Laterality Date   HERNIA REPAIR     KNEE SURGERY Right    LEFT HEART CATH AND CORONARY ANGIOGRAPHY N/A 05/21/2019   Procedure: LEFT HEART CATH AND CORONARY ANGIOGRAPHY;  Surgeon: Marcina Millard, MD;  Location: [facility] INVASIVE CV LAB;  Service: Cardiovascular;  Laterality: N/A;    There were no vitals filed for this visit.   Subjective Assessment - 05/11/22 1036     Subjective  Luis [name] presents for OT Rx to address head and neck lymphedema 2/2 surgical Rx and bilateral LND for larnyx cancer. Luis [name] was last seen for OT on 04/20/22. Today he expresses concern about increased pre auricular tissue density  and TMJ bilaterally. "I was so scared the cancer had come back."     Patient is accompanied by: --   device manufacturer's rep.   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 S/P total laryngectomy w B LND ( 6+/88). 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    Limitations altered  voice w/ prosthesis, difficulty swallowing, difficulty eating (teeth extracted, no dentures to date) chronic head/neck swelling and associated pain,  decreased cervical AROM, decreased L shoulder strength ( extension 4/5)    Special Tests Face and Neck measurements TBA Rx visit 2; Intake FOTO score 98/100    Patient Stated Goals reduce head/neck/facial swelling  and pain, and leep it from getting worse    Currently in Pain? Yes    Pain Onset Other (comment)   s/p surgery   Pain Onset More than a month ago                          OT Treatments/Exercises (OP) - 09/220/23      ADLs   ADL Education Given Yes      Manual Therapy   Manual Therapy Edema management;Manual Lymphatic Drainage (MLD);Taping    Edema Management  kinesio tape bilaterally anchored at clavicle and 4 finger fan cuts extending to pre and post auricular LN across surgical scar.    Myofascial Release scar massage, fibrosis technique    Manual Lymphatic Drainage (MLD) MLD to bilateral head and neck utilizing deep biaphragmatic breathing  , bilateral in tact  axillary LN, clavicular     nodes. Provided scar massage to surgical site. No facial MLD this date.                    OT Education - 05/11/22 1310     Education Details Reviewed scar massage along surgical scar, and deeper fibrosis techniques anterior to tempomandibular joints bilaterally. Reviewed TMJ stretch. Pt edu re convoluted foam compression [name] for face and neck compression during HOS to limit  swelling and fibrosis formation.    Person(s) Educated Patient    Methods Explanation;Demonstration;Handout;Verbal cues    Comprehension Verbalized understanding;Returned demonstration                 OT Long Term Goals -       OT LONG TERM GOAL #1   Title Given his functional score  of 98/100 on the FOTO outcomes measure, Pt will increase functional score by 2 Pts to 100% to improve basic and instrumental ADLs performance and QOL.    Baseline 98/100    Time 12    Period Weeks    Status New    Target Date 03/06/22      OT LONG TERM GOAL #2   Title Pt will verbalize understanding of lymphedema precautions and prevention strategies by identifying and discussing how s/he may implement five precautions into daily life using printed reference (modified assistance) to reduce risk of progression and to limit infection risk.    Baseline Max A    Time 4    Period Days    Status New    Target Date --   4th Rx visit     OT LONG TERM GOAL #3   Title Pt will achieve at least a 10% volume reductions to return head and neck to more typical size and shape, to limit infection risk and LE progression, to decrease pain, to improve function, and to improve body image and QOL.    Baseline TBA at Rx visit 1    Time 12    Period Weeks    Status New    Target Date 03/06/22      OT LONG TERM GOAL #4   Title Pt will achieve independent performance of,  and sustain no less than 85% compliance with all LE self-care home program components throughout CDT, including modified simple self-MLD, daily skin care and inspection,  lymphatic pumping the ex and appropriate compression to limit lymphedema progression and to limit further functional decline.    Baseline Max A    Time 12    Period Weeks    Status New    Target Date 03/06/22      OT LONG TERM GOAL #5   Title Pt will regain full neck AROM  in all planes, essential for optimal functional performance and safety.    Baseline Max A    Time 12     Period Weeks    Status New    Target Date 03/06/22      Long Term Additional Goals   Additional Long Term Goals Yes      OT LONG TERM GOAL #6   Title Pt will obtain proper compression garments/compression  braces and be independent with donning/doffing to optimize/stabilize with fluid reduction in 6 visits.    Baseline Max A    Time 6    Period Weeks    Status New    Target Date 12/22/20      OT LONG TERM GOAL #7   Title Pt will achieve reduction in pain and sensory discomfort in head and neck from initiall reported 8-9/ 10 to no more than 5/10 by end of Intensive to improve psychological coping, mood and QOL.    Baseline 8-9/10 (suspect neurogenic)    Time 12    Period Weeks    Status New    Target Date 03/06/22                   Plan - 05/11/22 1313     Clinical Impression Statement Pt presenting with palpable increase in  tissue density medial to TMJs and c/o increased jaw stiffness.  Mouth opening remains limited to 3 cm.Pt reports difficulty eating is more  to do with having no teeth or dentures rather than mouth opening at present. Obviously reduced tissue density was palpable after MLD and manual fibrosis and scar techniques with empphasis on jawline, pre and post auricular LNs, masseter muscles, submental area and TMJ. Applied kinesiotape bilaterally as established.  Pt educated re use of convoluted foam pad for HOS designed to limit fibrosis formation and facilitate improved lymph function at night. OT will complete measurements for Luis [name] next visit and submit to vendor for Luis [name] preauthorization. Pt in agreement with plan.    OT Occupational Profile and History Detailed Assessment- Review of Records and additional review of physical, cognitive, psychosocial history related to current functional performance    Occupational performance deficits (Please refer to evaluation for details): ADL's;IADL's;Rest and  Sleep;Work;Leisure;Social Participation;Other   body image   Body Structure / Function / Physical Skills ADL;ROM;Scar mobility;IADL;Edema;Skin integrity;Improper spinal/pelvic alignment;Fascial restriction;Strength;Decreased knowledge of precautions;Pain;Decreased knowledge of use of DME;Flexibility    Rehab Potential Good    Clinical Decision Making Several treatment options, min-mod task modification necessary    Comorbidities Affecting Occupational Performance: Presence of comorbidities impacting occupational performance    Comorbidities impacting occupational performance description: See SUBJECTIVE    Modification or Assistance to Complete Evaluation  Min-Moderate modification of tasks or assist with assess necessary to complete eval    OT Frequency 1x / week   2 x weekly recommended   but Pt requests 1 x only   OT Duration 12 weeks   2 x weekly recommended, but Pt requests 1 x only due increased burden of attending multiple, frequency medical appointments   OT Treatment/Interventions Self-care/ADL training;Therapeutic exercise;Manual Therapy;Coping strategies training;Therapeutic activities;Manual lymph drainage;DME and/or AE instruction;Compression bandaging;Other (comment);Scar mobilization;Patient/family education;Passive range of motion   skin care, kinesiotaping, myofascial release   Plan modified Intensive Phase Complete Decongestive Therapy (CDT: Manual lymphatic Drainage (MLD), compression, skin care, ther ex    OT Home Exercise Plan Set up trial in clinic with manufacturer's rep of Flexitouch, advanced, sequential pneumatic compression device specifically for head and neck  lymphatic decongestion.    Recommended Other Services [email or web address]    Consulted and Agree with Plan of Care Patient;Family member/caregiver             Patient will benefit from skilled therapeutic intervention in  order to improve the following deficits and impairments:   Body Structure / Function / Physical Skills: ADL, ROM, Scar mobility, IADL, Edema, Skin integrity, Improper  spinal/pelvic alignment, Fascial restriction, Strength, Decreased knowledge of precautions, Pain, Decreased knowledge of use of DME, Flexibility       Visit Diagnosis: Lymphedema, not elsewhere classified    Problem List Patient Active Problem List   Diagnosis Date Noted   Seasonal allergies 05/06/2021   Peripheral neuropathy, severe 03/16/2021   Uncontrolled type 2 diabetes mellitus 03/26/2020   Bilateral lower extremity edema 03/26/2020   History of non-ST elevation myocardial infarction (NSTEMI) 05/20/2019   Dyslipidemia 07/06/2018   GERD (gastroesophageal reflux disease) 07/05/2018   Obesity, unspecified 06/19/2017   Hypertension 12/01/2016   Methadone maintenance therapy patient (HCC) 02/24/2014   OSA (obstructive sleep apnea) 05/06/2013   Chronic back pain 12/25/2011    Loel Dubonnet, MS, OTR/L, CLT-LANA 05/11/22 1:30 PM   [name] MAIN [name] SERVICES [address] [place], [place], [address] Phone: [phone]   Fax:  [phone]  Name: Luis [name] MRN: [id number] Date of Birth: [birth year]

## 2022-05-12 ENCOUNTER — Ambulatory Visit: Payer: Medicaid Other

## 2022-05-18 ENCOUNTER — Ambulatory Visit: Payer: Medicaid Other | Admitting: Occupational Therapy

## 2022-05-20 ENCOUNTER — Ambulatory Visit: Payer: Medicaid Other | Admitting: Occupational Therapy

## 2022-05-20 DIAGNOSIS — I890 Lymphedema, not elsewhere classified: Secondary | ICD-10-CM

## 2022-05-20 NOTE — Therapy (Signed)
[facility] MAIN [facility] SERVICES [address] [place], [place], [address] Phone: [phone]   Fax:  [phone]  Occupational Therapy Treatment  Patient Details  Name: Luis [name] MRN: [id number] Date of Birth: August 05, 1971 Referring Provider (OT): Deidre Ala, MD   Encounter Date: 05/20/2022   OT End of Session - 05/20/22 1337     Visit Number 13    Number of Visits 36    Date for OT Re-Evaluation 06/21/22    Authorization Type 12 additional visits added commencing 04/20/22    Authorization - Visit Number 1    Authorization - Number of Visits 12    OT Start Time 1108    OT Stop Time 1208    OT Time Calculation (min) 60 min    Equipment Utilized During Treatment kinesiotape    Activity Tolerance Patient tolerated treatment well;No increased pain    Behavior During Therapy [facility] for tasks assessed/performed             Past Medical History:  Diagnosis Date   ACS (acute coronary syndrome) (HCC) 05/20/2019   Back pain    Diabetes mellitus without complication (HCC)    Drug addiction in remission (HCC)    High cholesterol    Hypertension    Knee pain    Myocardial infarction (HCC) 05/2019   Neuropathy     Past Surgical History:  Procedure Laterality Date   HERNIA REPAIR     KNEE SURGERY Right    LEFT HEART CATH AND CORONARY ANGIOGRAPHY N/A 05/21/2019   Procedure: LEFT HEART CATH AND CORONARY ANGIOGRAPHY;  Surgeon: Marcina Millard, MD;  Location: [facility] INVASIVE CV LAB;  Service: Cardiovascular;  Laterality: N/A;    There were no vitals filed for this visit.   Subjective Assessment - 05/20/22 1343     Subjective  Luis [name] presents for OT Rx to address head and neck lymphedema 2/2 surgical Rx and bilateral LND for larnyx cancer. Pt reports his Flexitouch arrived this week. He denies LE related head and neck pain this morning.He has no new complaints or concerns/.    Patient is accompanied by: --   device  manufacturer's rep.   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 S/P total laryngectomy w B LND ( 6+/88). 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    Limitations altered  voice w/ prosthesis, difficulty swallowing, difficulty eating (teeth extracted, no dentures to date) chronic head/neck swelling and associated pain,  decreased cervical AROM, decreased L shoulder strength ( extension 4/5)    Special Tests Face and Neck measurements TBA Rx visit 2; Intake FOTO score 98/100    Patient Stated Goals reduce head/neck/facial swelling  and pain, and leep it from getting worse    Pain Onset Other (comment)   s/p surgery   Pain Onset More than a month ago                          OT Treatments/Exercises (OP) - 05/20/22 1344       ADLs   ADL Education Given Yes      Manual Therapy   Manual Therapy Edema management;Manual Lymphatic Drainage (MLD);Taping    Edema Management kinesio tape bilaterally anchored at clavicle and 4 finger fan cuts extending to  pre and post auricular LN across surgical scar.    Myofascial Release scar massage, fibrosis technique    Manual Lymphatic Drainage (MLD) MLD to bilateral head and neck utilizing deep biaphragmatic breathing  , bilateral in tact  axillary LN, clavicular     nodes. Provided scar massage to surgical site. No facial MLD this date.                    OT Education - 05/20/22 1344     Education Details Continued skilled Pt/caregiver education  And LE ADL training throughout visit for lymphedema self care/ home program, including compression wrapping, compression garment and device wear/care, lymphatic pumping ther ex, simple self-MLD, and skin care. Reviewed scalene stretch    Person(s) Educated  Patient    Methods Explanation;Demonstration;Handout;Verbal cues    Comprehension Verbalized understanding;Returned demonstration                 OT Long Term Goals - 12/08/21 1303       OT LONG TERM GOAL #1   Title Given his functional score  of 98/100 on the FOTO outcomes measure, Pt will increase functional score by 2 Pts to 100% to improve basic and instrumental ADLs performance and QOL.    Baseline 98/100    Time 12    Period Weeks    Status New    Target Date 03/06/22      OT LONG TERM GOAL #2   Title Pt will verbalize understanding of lymphedema precautions and prevention strategies by identifying and discussing how s/he may implement five precautions into daily life using printed reference (modified assistance) to reduce risk of progression and to limit infection risk.    Baseline Max A    Time 4    Period Days    Status New    Target Date --   4th Rx visit     OT LONG TERM GOAL #3   Title Pt will achieve at least a 10% volume reductions to return head and neck to more typical size and shape, to limit infection risk and LE progression, to decrease pain, to improve function, and to improve body image and QOL.    Baseline TBA at Rx visit 1    Time 12    Period Weeks    Status New    Target Date 03/06/22      OT LONG TERM GOAL #4   Title Pt will achieve independent performance of,  and sustain no less than 85% compliance with all LE self-care home program components throughout CDT, including modified simple self-MLD, daily skin care and inspection,  lymphatic pumping the ex and appropriate compression to limit lymphedema progression and to limit further functional decline.    Baseline Max A    Time 12    Period Weeks    Status New    Target Date 03/06/22      OT LONG TERM GOAL #5   Title Pt will regain full neck AROM  in all planes, essential for optimal functional performance and safety.    Baseline Max A    Time 12    Period Weeks    Status New    Target  Date 03/06/22      Long Term Additional Goals   Additional Long Term Goals Yes      OT LONG TERM GOAL #6   Title Pt will obtain proper compression garments/compression braces and be independent with donning/doffing to optimize/stabilize with fluid reduction in  6 visits.    Baseline Max A    Time 6    Period Weeks    Status New    Target Date 12/22/20      OT LONG TERM GOAL #7   Title Pt will achieve reduction in pain and sensory discomfort in head and neck from initiall reported 8-9/ 10 to no more than 5/10 by end of Intensive to improve psychological coping, mood and QOL.    Baseline 8-9/10 (suspect neurogenic)    Time 12    Period Weeks    Status New    Target Date 03/06/22                   Plan - 05/20/22 1338     Clinical Impression Statement Provided MLD as established utilizing bilateral axillary watersheds as established. Provided scap massage and myofascial release at R sternocleidomastoid. Pt tolerated this without increased pain. Applied kinesiotape as established with excellent tolerance. Pt continues to demonstrate progress towards all goals. He and I are very pleased that his Flexitouch advance4d sequential pneumatic compression device finally was approved by [employer] and delivered last week. Pt plans to try setting it up and using it this weekend. This device is expected to assist with swelling and fibrosis management over time at home. OT emailed manufacturer's rep and requested she set Luis [name] with a trainer ASAP for garment set up and operation training. Conmt as per POC.    OT Occupational Profile and History Detailed Assessment- Review of Records and additional review of physical, cognitive, psychosocial history related to current functional performance    Occupational performance deficits (Please refer to evaluation for details): ADL's;IADL's;Rest and Sleep;Work;Leisure;Social Participation;Other   body image   Body Structure / Function / Physical Skills  ADL;ROM;Scar mobility;IADL;Edema;Skin integrity;Improper spinal/pelvic alignment;Fascial restriction;Strength;Decreased knowledge of precautions;Pain;Decreased knowledge of use of DME;Flexibility    Rehab Potential Good    Clinical Decision Making Several treatment options, min-mod task modification necessary    Comorbidities Affecting Occupational Performance: Presence of comorbidities impacting occupational performance    Comorbidities impacting occupational performance description: See SUBJECTIVE    Modification or Assistance to Complete Evaluation  Min-Moderate modification of tasks or assist with assess necessary to complete eval    OT Frequency 1x / week   2 x weekly recommended   but Pt requests 1 x only   OT Duration 12 weeks   2 x weekly recommended, but Pt requests 1 x only due increased burden of attending multiple, frequency medical appointments   OT Treatment/Interventions Self-care/ADL training;Therapeutic exercise;Manual Therapy;Coping strategies training;Therapeutic activities;Manual lymph drainage;DME and/or AE instruction;Compression bandaging;Other (comment);Scar mobilization;Patient/family education;Passive range of motion   skin care, kinesiotaping, myofascial release   Plan modified Intensive Phase Complete Decongestive Therapy (CDT: Manual lymphatic Drainage (MLD), compression, skin care, ther ex    OT Home Exercise Plan Set up trial in clinic with manufacturer's rep of Flexitouch, advanced, sequential pneumatic compression device specifically for head and neck  lymphatic decongestion.    Recommended Other Services [email or web address]    Consulted and Agree with Plan of Care Patient;Family member/caregiver             Patient will benefit from skilled therapeutic intervention in order to improve the following deficits and impairments:   Body Structure / Function / Physical Skills: ADL,  ROM, Scar mobility, IADL, Edema, Skin integrity, Improper spinal/pelvic alignment, Fascial restriction, Strength, Decreased knowledge of precautions, Pain, Decreased knowledge of use of DME, Flexibility       Visit  Diagnosis: Lymphedema, not elsewhere classified    Problem List Patient Active Problem List   Diagnosis Date Noted   Seasonal allergies 05/06/2021   Peripheral neuropathy, severe 03/16/2021   Uncontrolled type 2 diabetes mellitus 03/26/2020   Bilateral lower extremity edema 03/26/2020   History of non-ST elevation myocardial infarction (NSTEMI) 05/20/2019   Dyslipidemia 07/06/2018   GERD (gastroesophageal reflux disease) 07/05/2018   Obesity, unspecified 06/19/2017   Hypertension 12/01/2016   Methadone maintenance therapy patient (HCC) 02/24/2014   OSA (obstructive sleep apnea) 05/06/2013   Chronic back pain 12/25/2011    Luis Dubonnet, MS, OTR/L, CLT-LANA 05/20/22 1:45 PM   [facility] MAIN [facility] SERVICES [address] [place], [place], [address] Phone: [phone]   Fax:  [phone]  Name: Luis [name] MRN: [id number] Date of Birth: 29-Feb-1971

## 2022-05-25 ENCOUNTER — Ambulatory Visit: Payer: Medicaid Other | Admitting: Occupational Therapy

## 2022-05-26 ENCOUNTER — Ambulatory Visit: Payer: Medicaid Other | Attending: Gerontology | Admitting: Occupational Therapy

## 2022-06-01 ENCOUNTER — Ambulatory Visit: Payer: Medicaid Other | Admitting: Occupational Therapy

## 2022-06-02 ENCOUNTER — Ambulatory Visit: Payer: Medicaid Other | Admitting: Occupational Therapy

## 2022-06-06 ENCOUNTER — Ambulatory Visit: Payer: Medicaid Other | Admitting: Occupational Therapy

## 2022-06-08 ENCOUNTER — Ambulatory Visit: Payer: Medicaid Other | Admitting: Occupational Therapy

## 2022-06-15 ENCOUNTER — Ambulatory Visit: Payer: Medicaid Other | Admitting: Occupational Therapy

## 2022-06-20 NOTE — Addendum Note (Signed)
Addended by: Ansel Bong on: 06/20/2022 10:24 AM   Modules accepted: Orders

## 2022-06-20 NOTE — Therapy (Signed)
[place] MAIN [facility] SERVICES [address] [place], [place], [address] Phone: [phone]   Fax:  [phone]  Patient Details  Name: Luis [name] MRN: [id number] Date of Birth: 1970/12/26 Referring Provider:  Carmin Richmond, MD  Encounter Date: 03/23/2022   Ansel Bong, OT 06/20/2022, 10:26 AM  [place] MAIN [facility] SERVICES [address] [place], [place], [address] Phone: [phone]   Fax:  [phone]

## 2022-06-20 NOTE — Addendum Note (Signed)
Addended by: Ansel Bong on: 06/20/2022 03:07 PM   Modules accepted: Orders

## 2022-06-20 NOTE — Therapy (Signed)
[place] MAIN [facility] SERVICES [place], [place], [address] Phone: [phone]   Fax:  [phone]  Occupational Therapy Treatment  Patient Details  Name: Luis [name] MRN: [id number] Date of Birth: 1971-06-06 Referring Provider (OT): Carmin Richmond, MD   Encounter Date: 03/02/2022    Past Medical History:  Diagnosis Date   ACS (acute coronary syndrome) ([place]) 05/20/2019   Back pain    Diabetes mellitus without complication ([place])    Drug addiction in remission ([place])    High cholesterol    Hypertension    Knee pain    Myocardial infarction [facility]) 05/2019   Neuropathy     Past Surgical History:  Procedure Laterality Date   HERNIA REPAIR     KNEE SURGERY Right    LEFT HEART CATH AND CORONARY ANGIOGRAPHY N/A 05/21/2019   Procedure: LEFT HEART CATH AND CORONARY ANGIOGRAPHY;  Surgeon: Isaias Cowman, MD;  Location: [place] [facility];  Service: Cardiovascular;  Laterality: N/A;    There were no vitals filed for this visit.                              OT Long Term Goals - 06/20/22 1131       OT LONG TERM GOAL #1   Title Given his functional score  of 98/100 on the FOTO outcomes measure, Pt will increase functional score by 2 Pts to 100% to improve basic and instrumental ADLs performance and QOL.    Baseline 98/100    Time 12    Period Weeks    Status On-going    Target Date 09/18/22      OT LONG TERM GOAL #2   Title Pt will verbalize understanding of lymphedema precautions and prevention strategies by identifying and discussing how s/he may implement five precautions into daily life using printed reference (modified assistance) to reduce risk of progression and to limit infection risk.    Baseline Max A    Time 4    Period Days    Status Achieved    Target Date --   4th Rx visit     OT LONG TERM GOAL #3   Title Pt will achieve at least a 10% volume reductions to return head  and neck to more typical size and shape, to limit infection risk and LE progression, to decrease pain, to improve function, and to improve body image and QOL.    Baseline TBA at Rx visit 1. 03/23/22: See OT note 03/23/22 for progress note volumetrics  Mouth opening 3 cm. No change. RIGHT facial composite decreased by 21.3%.10% GOAL MET!  LEFT facial composite decreased by 7.6%, and Neck composite decreased 7.06% since commencing OT for CDT on 12/20/21.     Time 12    Period Weeks    Status Partially Met   See OT note 03/23/22 for progress note volumetrics Mouth opening 3 cm. No change. RIGHT facial composite decreased by 21.3%.10% GOAL MET!  LEFT facial composite decreased by 7.6%, and Neck composite decreased 7.06% since commencing OT for CDT on 12/20/21.   Target Date 09/18/22      OT LONG TERM GOAL #4   Title Pt will achieve independent performance of,  and sustain no less than 85% compliance with all LE self-care home program components throughout CDT, including modified simple self-MLD, daily skin care and inspection,  lymphatic pumping the ex and appropriate compression to limit lymphedema  progression and to limit further functional decline.    Baseline Max A    Time 12    Period Weeks    Status Achieved      OT LONG TERM GOAL #5   Title Pt will regain full neck AROM  in all planes, essential for optimal functional performance and safety.    Baseline Max A    Time 12    Period Weeks    Status Partially Met    Target Date 09/18/22      OT LONG TERM GOAL #6   Title Pt will obtain proper compression garments/compression braces and be independent with donning/doffing to optimize/stabilize with fluid reduction in 6 visits.    Baseline Max A    Time 6    Period Weeks    Status On-going   In progress   Target Date 09/18/22      OT LONG TERM GOAL #7   Title Pt will achieve reduction in pain and sensory discomfort in head and neck from initiall reported 8-9/ 10 to no more than 5/10 by end of  Intensive to improve psychological coping, mood and QOL.    Baseline 8-9/10 (suspect neurogenic)    Time 12    Period Weeks    Status On-going    Target Date 09/18/22                    Patient will benefit from skilled therapeutic intervention in order to improve the following deficits and impairments:   Body Structure / Function / Physical Skills: ADL, ROM, Scar mobility, IADL, Edema, Skin integrity, Improper spinal/pelvic alignment, Fascial restriction, Strength, Decreased knowledge of precautions, Pain, Decreased knowledge of use of DME, Flexibility       Visit Diagnosis: Lymphedema, not elsewhere classified - Plan: Ot plan of care cert/re-cert    Problem List Patient Active Problem List   Diagnosis Date Noted   Seasonal allergies 05/06/2021   Peripheral neuropathy, severe 03/16/2021   Uncontrolled type 2 diabetes mellitus 03/26/2020   Bilateral lower extremity edema 03/26/2020   History of non-ST elevation myocardial infarction (NSTEMI) 05/20/2019   Dyslipidemia 07/06/2018   GERD (gastroesophageal reflux disease) 07/05/2018   Obesity, unspecified 06/19/2017   Hypertension 12/01/2016   Methadone maintenance therapy patient (HCC) 02/24/2014   OSA (obstructive sleep apnea) 05/06/2013   Chronic back pain 12/25/2011    Theresa L Gilliam, OT 06/20/2022, 3:05 PM  [place] [address], [place], [address] Phone: [phone]   Fax:  [phone]  Name: Zacharia Lee Powless MRN: [id number] Date of Birth: 12/02/1970  

## 2022-06-22 ENCOUNTER — Ambulatory Visit: Payer: Medicaid Other | Admitting: Occupational Therapy

## 2022-06-23 ENCOUNTER — Encounter: Payer: Self-pay | Admitting: Occupational Therapy

## 2022-06-23 NOTE — Patient Instructions (Signed)

## 2022-06-29 ENCOUNTER — Ambulatory Visit: Payer: Medicaid Other | Admitting: Occupational Therapy

## 2022-06-29 ENCOUNTER — Telehealth: Payer: Self-pay | Admitting: Occupational Therapy

## 2022-06-29 NOTE — Telephone Encounter (Signed)
LVM on mothers phone to let patient know that we will need to cancel today's appointment because we do not have approval from Medicaid. I left the office phone number and asked that they call and confirm they received this message.

## 2022-07-07 ENCOUNTER — Ambulatory Visit: Payer: Medicaid Other | Attending: Gerontology | Admitting: Occupational Therapy

## 2022-07-11 ENCOUNTER — Ambulatory Visit: Payer: Medicaid Other | Admitting: Occupational Therapy

## 2022-07-11 ENCOUNTER — Telehealth: Payer: Self-pay | Admitting: Occupational Therapy

## 2022-07-18 ENCOUNTER — Telehealth: Payer: Self-pay | Admitting: Occupational Therapy

## 2022-07-18 ENCOUNTER — Encounter: Payer: Self-pay | Admitting: Occupational Therapy

## 2022-07-18 ENCOUNTER — Ambulatory Visit: Payer: Medicaid Other | Admitting: Occupational Therapy

## 2022-07-18 DIAGNOSIS — I890 Lymphedema, not elsewhere classified: Secondary | ICD-10-CM

## 2022-07-18 NOTE — Telephone Encounter (Signed)
Pt missed 3rd consecutively scheduled OT appointment without call to cancel for lymphedema care to head and neck. Spoke with Pt and his mother by phone to check in, to confirm Medicaid authorized additional sessions, and to discuss plan going forward. Pt states" he's good right now". He verbalized understanding that he'll need a new referral to resume care PRN. Pt is DC OT this date. Loel Dubonnet, MS, OTR/L, CLT-[facility] @TODAY @ 8:38 AM

## 2022-07-18 NOTE — Telephone Encounter (Signed)
Pt missed 3rd consecutively scheduled OT appointment without call to cancel for lymphedema care to head and neck. Spoke with Pt and his mother by phone to check in, to confirm Medicaid authorized additional sessions, and to discuss plan going forward. Pt states" he's good right now". He verbalized understanding that he'll need a new referral to resume care PRN. Pt is DC OT this date.

## 2022-07-25 ENCOUNTER — Ambulatory Visit: Payer: Medicaid Other | Admitting: Occupational Therapy

## 2022-08-01 ENCOUNTER — Ambulatory Visit: Payer: Medicaid Other | Admitting: Occupational Therapy

## 2022-08-08 ENCOUNTER — Encounter: Payer: Medicaid Other | Admitting: Occupational Therapy

## 2022-08-23 ENCOUNTER — Encounter: Payer: Medicaid Other | Admitting: Occupational Therapy

## 2023-03-30 ENCOUNTER — Other Ambulatory Visit: Payer: Self-pay
